# Patient Record
Sex: Female | Born: 1985
Health system: Southern US, Community
[De-identification: ages and names within clinical notes are randomized; demographics above are authoritative.]

## PROBLEM LIST (undated history)

## (undated) DIAGNOSIS — R112 Nausea with vomiting, unspecified: Secondary | ICD-10-CM

## (undated) DIAGNOSIS — J811 Chronic pulmonary edema: Secondary | ICD-10-CM

## (undated) DIAGNOSIS — F329 Major depressive disorder, single episode, unspecified: Secondary | ICD-10-CM

## (undated) DIAGNOSIS — I5189 Other ill-defined heart diseases: Secondary | ICD-10-CM

## (undated) DIAGNOSIS — N10 Acute tubulo-interstitial nephritis: Secondary | ICD-10-CM

## (undated) DIAGNOSIS — A4150 Gram-negative sepsis, unspecified: Secondary | ICD-10-CM

## (undated) DIAGNOSIS — T4145XA Adverse effect of unspecified anesthetic, initial encounter: Secondary | ICD-10-CM

## (undated) DIAGNOSIS — A4151 Sepsis due to Escherichia coli [E. coli]: Secondary | ICD-10-CM

## (undated) DIAGNOSIS — J9600 Acute respiratory failure, unspecified whether with hypoxia or hypercapnia: Secondary | ICD-10-CM

## (undated) DIAGNOSIS — B019 Varicella without complication: Secondary | ICD-10-CM

## (undated) DIAGNOSIS — F909 Attention-deficit hyperactivity disorder, unspecified type: Secondary | ICD-10-CM

## (undated) DIAGNOSIS — I429 Cardiomyopathy, unspecified: Secondary | ICD-10-CM

## (undated) DIAGNOSIS — F419 Anxiety disorder, unspecified: Secondary | ICD-10-CM

## (undated) DIAGNOSIS — T8859XA Other complications of anesthesia, initial encounter: Secondary | ICD-10-CM

## (undated) DIAGNOSIS — N390 Urinary tract infection, site not specified: Secondary | ICD-10-CM

## (undated) DIAGNOSIS — R6521 Severe sepsis with septic shock: Secondary | ICD-10-CM

## (undated) DIAGNOSIS — N1330 Unspecified hydronephrosis: Secondary | ICD-10-CM

## (undated) DIAGNOSIS — J189 Pneumonia, unspecified organism: Secondary | ICD-10-CM

## (undated) DIAGNOSIS — I519 Heart disease, unspecified: Secondary | ICD-10-CM

## (undated) DIAGNOSIS — G43909 Migraine, unspecified, not intractable, without status migrainosus: Secondary | ICD-10-CM

## (undated) DIAGNOSIS — Z87442 Personal history of urinary calculi: Secondary | ICD-10-CM

## (undated) DIAGNOSIS — F32A Depression, unspecified: Secondary | ICD-10-CM

## (undated) DIAGNOSIS — Z9889 Other specified postprocedural states: Secondary | ICD-10-CM

## (undated) HISTORY — DX: Urinary tract infection, site not specified: N39.0

## (undated) HISTORY — DX: Attention-deficit hyperactivity disorder, unspecified type: F90.9

## (undated) HISTORY — DX: Varicella without complication: B01.9

## (undated) HISTORY — PX: WISDOM TOOTH EXTRACTION: SHX21

## (undated) HISTORY — DX: Major depressive disorder, single episode, unspecified: F32.9

## (undated) HISTORY — DX: Migraine, unspecified, not intractable, without status migrainosus: G43.909

## (undated) HISTORY — DX: Depression, unspecified: F32.A

---

## 2017-02-13 ENCOUNTER — Ambulatory Visit (INDEPENDENT_AMBULATORY_CARE_PROVIDER_SITE_OTHER): Payer: BLUE CROSS/BLUE SHIELD | Admitting: Family

## 2017-02-13 ENCOUNTER — Encounter: Payer: Self-pay | Admitting: Family

## 2017-02-13 VITALS — BP 117/66 | HR 83 | Temp 98.0°F | Resp 16 | Ht 63.0 in | Wt 179.0 lb

## 2017-02-13 DIAGNOSIS — G43909 Migraine, unspecified, not intractable, without status migrainosus: Secondary | ICD-10-CM | POA: Diagnosis not present

## 2017-02-13 DIAGNOSIS — Z23 Encounter for immunization: Secondary | ICD-10-CM | POA: Diagnosis not present

## 2017-02-13 DIAGNOSIS — N390 Urinary tract infection, site not specified: Secondary | ICD-10-CM | POA: Diagnosis not present

## 2017-02-13 DIAGNOSIS — Z0001 Encounter for general adult medical examination with abnormal findings: Secondary | ICD-10-CM

## 2017-02-13 DIAGNOSIS — E559 Vitamin D deficiency, unspecified: Secondary | ICD-10-CM

## 2017-02-13 DIAGNOSIS — F329 Major depressive disorder, single episode, unspecified: Secondary | ICD-10-CM | POA: Diagnosis not present

## 2017-02-13 DIAGNOSIS — Z79899 Other long term (current) drug therapy: Secondary | ICD-10-CM

## 2017-02-13 DIAGNOSIS — G4700 Insomnia, unspecified: Secondary | ICD-10-CM

## 2017-02-13 DIAGNOSIS — F32A Depression, unspecified: Secondary | ICD-10-CM

## 2017-02-13 DIAGNOSIS — Z87440 Personal history of urinary (tract) infections: Secondary | ICD-10-CM

## 2017-02-13 DIAGNOSIS — F909 Attention-deficit hyperactivity disorder, unspecified type: Secondary | ICD-10-CM | POA: Diagnosis not present

## 2017-02-13 DIAGNOSIS — Z0000 Encounter for general adult medical examination without abnormal findings: Secondary | ICD-10-CM

## 2017-02-13 HISTORY — DX: Attention-deficit hyperactivity disorder, unspecified type: F90.9

## 2017-02-13 LAB — POC URINALSYSI DIPSTICK (AUTOMATED)
Bilirubin, UA: NEGATIVE
Glucose, UA: NEGATIVE
KETONES UA: NEGATIVE
Leukocytes, UA: NEGATIVE
Nitrite, UA: NEGATIVE
PH UA: 6 (ref 5.0–8.0)
PROTEIN UA: NEGATIVE
RBC UA: NEGATIVE
SPEC GRAV UA: 1.025 (ref 1.010–1.025)
UROBILINOGEN UA: 0.2 U/dL

## 2017-02-13 MED ORDER — ZOLPIDEM TARTRATE 5 MG PO TABS: 5.0000 mg | ORAL_TABLET | Freq: Every evening | ORAL | 0 refills | Status: DC | PRN

## 2017-02-13 NOTE — Patient Instructions (Addendum)
You can try scheduling an appointment with Dr. Rosanna Randy at [facility] [phone] You will be contacted about your referral to GYN- Dr. Erin Fulling. Please complete lab work prior to leaving.

## 2017-02-13 NOTE — Assessment & Plan Note (Signed)
Not on meds. She is interested in starting meds. Will [facility] to [facility]

## 2017-02-13 NOTE — Progress Notes (Signed)
Subjective:    Patient ID: Stephanie Patrick, female    DOB: 03/18/1985, 31 y.o.   MRN: [id number]  HPI  Patient is a 31 yr old female who presents today to establish care.  pmhx is significant for the following:  1) recurrent UTI- last infection was 5/18. Happens more when she is sexually active. Not currently sexually active.    2) Migraines- Had migraines in HS. No migraines since.   3) Depression-  Reports difficulty sleeping. Using melatonin. Has tried lunesta without improvement.  Ambien helps.  Trazodone made her very groggy.    4) Vit D deficiency- taking 50,000 once weekly.    5) Insomnia- reports that this is a long-standing problem for her.  She currently takes melatonin 6 mg by mouth once daily.  She reports that she has taken Ambien in the past which has helped her.  She does not wish to take this on a daily basis however.  6) ADHD- not on medication.  Notes some difficulty processing sound.  She does not feel that she has issues hearing the noises.  This seems to be worse since she has been off of ADHD medication.  She is open minded to restarting medication.  Preventative care  Immunizations: td 2012, due for flu shot Diet: workig on healthy diet, has lost 16 pounds Exercise:  Some exercise, trying to add more, quit Pap Smear: 2017, normal per patient  Review of Systems  Constitutional: Negative for unexpected weight change.  HENT: Negative for rhinorrhea.   Respiratory: Negative for cough.   Cardiovascular: Negative for leg swelling.  Gastrointestinal: Negative for abdominal pain, constipation and vomiting.  Genitourinary: Negative for dysuria, frequency and menstrual problem.  Musculoskeletal:       Some shoulder "blade" pain- sees chiropractor  Skin: Negative for rash.  Neurological: Negative for headaches.  Hematological: Negative for adenopathy.  Psychiatric/Behavioral:       Depression is stable.        Past Medical History:  Diagnosis Date  .  Chicken pox   . Depression   . Migraines   . Urinary tract infection      Social History   Socioeconomic History  . Marital status: Single    Spouse name: Not on file  . Number of children: Not on file  . Years of education: Not on file  . Highest education level: Not on file  Social Needs  . Financial resource strain: Not on file  . Food insecurity - worry: Not on file  . Food insecurity - inability: Not on file  . Transportation needs - medical: Not on file  . Transportation needs - non-medical: Not on file  Occupational History  . Not on file  Tobacco Use  . Smoking status: Never Smoker  . Smokeless tobacco: Never Used  Substance and Sexual Activity  . Alcohol use: Yes  . Drug use: No  . Sexual activity: Yes    Partners: Male  Other Topics Concern  . Not on file  Social History Narrative  . Not on file    History reviewed. No pertinent surgical history.  Family History  Problem Relation Age of Onset  . Cancer Maternal Grandmother   . Diabetes Maternal Grandmother   . Hypertension Maternal Grandmother     Allergies not on file  Current Outpatient Medications on File Prior to Visit  Medication Sig Dispense Refill  . Ascorbic Acid (VITAMIN C) 100 MG tablet Take 100 mg by mouth daily.    .Marland Kitchen  b complex vitamins tablet Take 1 tablet by mouth daily.    . cholecalciferol (VITAMIN D) 1000 units tablet Take 1,000 Units by mouth daily.    . Prenatal Vit-Fe Fumarate-FA (PRENATAL MULTIVITAMIN) TABS tablet Take 1 tablet by mouth daily at 12 noon.     No current facility-administered medications on file prior to visit.     BP 117/66 (BP Location: Right Arm, Patient Position: Sitting, Cuff Size: Small)   Pulse 83   Temp 98 F (36.7 C) (Oral)   Resp 16   Ht 5\' 3"  (1.6 m)   Wt 179 lb (81.2 kg)   SpO2 100%   BMI 31.71 kg/m    Objective:   Physical Exam Physical Exam  Constitutional: She is oriented to person, place, and time. She appears well-developed and  well-nourished. No distress.  HENT:  Head: Normocephalic and atraumatic.  Right Ear: Tympanic membrane and ear canal normal.  Left Ear: Tympanic membrane and ear canal normal.  Mouth/Throat: Oropharynx is clear and moist.  Eyes: Pupils are equal, round, and reactive to light. No scleral icterus.  Neck: Normal range of motion. No thyromegaly present.  Cardiovascular: Normal rate and regular rhythm.   No murmur heard. Pulmonary/Chest: Effort normal and breath sounds normal. No respiratory distress. He has no wheezes. She has no rales. She exhibits no tenderness.  Abdominal: Soft. Bowel sounds are normal. She exhibits no distension and no mass. There is no tenderness. There is no rebound and no guarding.  Musculoskeletal: She exhibits no edema.  Lymphadenopathy:    She has no cervical adenopathy.  Neurological: She is alert and oriented to person, place, and time. She has normal patellar reflexes. She exhibits normal muscle tone. Coordination normal.  Skin: Skin is warm and dry.  Psychiatric: She has a normal mood and affect. Her behavior is normal. Judgment and thought content normal.  Breast/pelvic: deferred           Assessment & Plan:          Assessment & Plan:

## 2017-02-13 NOTE — Assessment & Plan Note (Signed)
Stable off meds.  Monitor.  

## 2017-02-13 NOTE — Assessment & Plan Note (Signed)
Stable currently, UA negative. We discussed possibility of post-coital abx prophylaxis in the future.

## 2017-02-14 LAB — PAIN MGMT, PROFILE 8 W/CONF, U
6 ACETYLMORPHINE: NEGATIVE ng/mL (ref <10)
ALCOHOL METABOLITES: NEGATIVE ng/mL (ref <500)
AMPHETAMINES: NEGATIVE ng/mL (ref <500)
Benzodiazepines: NEGATIVE ng/mL (ref <100)
Buprenorphine, Urine: NEGATIVE ng/mL (ref <5)
COCAINE METABOLITE: NEGATIVE ng/mL (ref <150)
CREATININE: 73.1 mg/dL
MARIJUANA METABOLITE: NEGATIVE ng/mL (ref <20)
MDMA: NEGATIVE ng/mL (ref <500)
OPIATES: NEGATIVE ng/mL (ref <100)
Oxidant: NEGATIVE ug/mL (ref <200)
Oxycodone: NEGATIVE ng/mL (ref <100)
PH: 6.53 (ref 4.5–9.0)

## 2017-02-14 LAB — CBC WITH DIFFERENTIAL/PLATELET
BASOS ABS: 51 {cells}/uL (ref 0–200)
Basophils Relative: 0.7 %
EOS PCT: 1.4 %
Eosinophils Absolute: 102 cells/uL (ref 15–500)
HCT: 40.3 % (ref 35.0–45.0)
Hemoglobin: 14 g/dL (ref 11.7–15.5)
LYMPHS ABS: 2015 {cells}/uL (ref 850–3900)
MCH: 29.9 pg (ref 27.0–33.0)
MCHC: 34.7 g/dL (ref 32.0–36.0)
MCV: 86.1 fL (ref 80.0–100.0)
MONOS PCT: 8.1 %
MPV: 11.9 fL (ref 7.5–12.5)
NEUTROS PCT: 62.2 %
Neutro Abs: 4541 cells/uL (ref 1500–7800)
PLATELETS: 331 10*3/uL (ref 140–400)
RBC: 4.68 10*6/uL (ref 3.80–5.10)
RDW: 12.7 % (ref 11.0–15.0)
TOTAL LYMPHOCYTE: 27.6 %
WBC mixed population: 591 cells/uL (ref 200–950)
WBC: 7.3 10*3/uL (ref 3.8–10.8)

## 2017-02-14 LAB — HEPATIC FUNCTION PANEL
AG RATIO: 2 (calc) (ref 1.0–2.5)
ALKALINE PHOSPHATASE (APISO): 74 U/L (ref 33–115)
ALT: 28 U/L (ref 6–29)
AST: 26 U/L (ref 10–30)
Albumin: 4.5 g/dL (ref 3.6–5.1)
BILIRUBIN DIRECT: 0.1 mg/dL (ref 0.0–0.2)
BILIRUBIN INDIRECT: 0.3 mg/dL (ref 0.2–1.2)
BILIRUBIN TOTAL: 0.4 mg/dL (ref 0.2–1.2)
Globulin: 2.3 g/dL (calc) (ref 1.9–3.7)
Total Protein: 6.8 g/dL (ref 6.1–8.1)

## 2017-02-14 LAB — VITAMIN D 25 HYDROXY (VIT D DEFICIENCY, FRACTURES): VIT D 25 HYDROXY: 41 ng/mL (ref 30–100)

## 2017-02-14 LAB — BASIC METABOLIC PANEL
BUN: 13 mg/dL (ref 7–25)
CHLORIDE: 101 mmol/L (ref 98–110)
CO2: 24 mmol/L (ref 20–32)
CREATININE: 0.76 mg/dL (ref 0.50–1.10)
Calcium: 9.6 mg/dL (ref 8.6–10.2)
Glucose, Bld: 97 mg/dL (ref 65–99)
POTASSIUM: 4.3 mmol/L (ref 3.5–5.3)
SODIUM: 138 mmol/L (ref 135–146)

## 2017-02-14 LAB — TSH: TSH: 2.69 m[IU]/L

## 2017-02-14 LAB — LIPID PANEL
Cholesterol: 142 mg/dL (ref <200)
HDL: 34 mg/dL — ABNORMAL LOW (ref >50)
LDL Cholesterol (Calc): 90 mg/dL (calc)
NON-HDL CHOLESTEROL (CALC): 108 mg/dL (ref <130)
Total CHOL/HDL Ratio: 4.2 (calc) (ref <5.0)
Triglycerides: 89 mg/dL (ref <150)

## 2017-02-16 DIAGNOSIS — G4700 Insomnia, unspecified: Secondary | ICD-10-CM | POA: Insufficient documentation

## 2017-02-16 DIAGNOSIS — E559 Vitamin D deficiency, unspecified: Secondary | ICD-10-CM | POA: Insufficient documentation

## 2017-02-16 DIAGNOSIS — G43909 Migraine, unspecified, not intractable, without status migrainosus: Secondary | ICD-10-CM | POA: Insufficient documentation

## 2017-02-16 DIAGNOSIS — Z0000 Encounter for general adult medical examination without abnormal findings: Secondary | ICD-10-CM | POA: Insufficient documentation

## 2017-02-16 NOTE — Assessment & Plan Note (Signed)
Will prescribe as needed Ambien.  Urine drug screen will be obtained today and a controlled substance contract is signed.

## 2017-02-16 NOTE — Assessment & Plan Note (Signed)
Stable, check vit D level.  Continue supplement.

## 2017-02-16 NOTE — Assessment & Plan Note (Signed)
Stable, no recent migraines.  

## 2017-02-16 NOTE — Assessment & Plan Note (Signed)
Discussed healthy diet, regular exercise and weight loss.  Refer to GYN for ongoing gyn care at pt request- pap is up to date. She wishes to have IUD removed.  Flu shot today. Obtain routine lab work.

## 2017-03-01 ENCOUNTER — Encounter: Payer: Self-pay | Admitting: Family

## 2017-03-02 ENCOUNTER — Other Ambulatory Visit: Payer: Self-pay | Admitting: Family

## 2017-03-02 MED ORDER — ZOLPIDEM TARTRATE 10 MG PO TABS: 10.0000 mg | ORAL_TABLET | Freq: Every evening | ORAL | 2 refills | Status: DC | PRN

## 2017-03-06 ENCOUNTER — Ambulatory Visit: Payer: Self-pay | Admitting: Family

## 2017-03-11 ENCOUNTER — Encounter: Payer: BLUE CROSS/BLUE SHIELD | Admitting: Obstetrics & Gynecology

## 2017-03-19 ENCOUNTER — Encounter: Payer: BLUE CROSS/BLUE SHIELD | Admitting: Obstetrics & Gynecology

## 2017-03-23 ENCOUNTER — Encounter: Payer: BLUE CROSS/BLUE SHIELD | Admitting: Obstetrics & Gynecology

## 2017-03-25 ENCOUNTER — Encounter: Payer: BLUE CROSS/BLUE SHIELD | Admitting: Obstetrics & Gynecology

## 2017-03-26 ENCOUNTER — Ambulatory Visit: Payer: Self-pay | Admitting: Family

## 2017-03-27 ENCOUNTER — Encounter: Payer: Self-pay | Admitting: Family

## 2017-03-27 ENCOUNTER — Ambulatory Visit: Payer: BLUE CROSS/BLUE SHIELD | Admitting: Family

## 2017-03-27 VITALS — BP 114/73 | HR 96 | Temp 98.4°F | Resp 16 | Ht 63.0 in

## 2017-03-27 DIAGNOSIS — G4700 Insomnia, unspecified: Secondary | ICD-10-CM

## 2017-03-27 DIAGNOSIS — K1379 Other lesions of oral mucosa: Secondary | ICD-10-CM | POA: Diagnosis not present

## 2017-03-27 DIAGNOSIS — F988 Other specified behavioral and emotional disorders with onset usually occurring in childhood and adolescence: Secondary | ICD-10-CM

## 2017-03-27 DIAGNOSIS — R350 Frequency of micturition: Secondary | ICD-10-CM | POA: Diagnosis not present

## 2017-03-27 DIAGNOSIS — F419 Anxiety disorder, unspecified: Secondary | ICD-10-CM | POA: Diagnosis not present

## 2017-03-27 MED ORDER — ZOLPIDEM TARTRATE 10 MG PO TABS: 10.0000 mg | ORAL_TABLET | Freq: Every evening | ORAL | 2 refills | Status: DC | PRN

## 2017-03-27 MED ORDER — LIDOCAINE VISCOUS 2 % MT SOLN: OROMUCOSAL | 5 refills | Status: DC

## 2017-03-27 NOTE — Patient Instructions (Signed)
Please complete lab work prior to leaving.   

## 2017-03-27 NOTE — Progress Notes (Signed)
Subjective:    Patient ID: Stephanie Patrick, female    DOB: 10/17/1985, 32 y.o.   MRN: [id number]  HPI  Stephanie Patrick is a 32 yr old female who presents today with report of recurrent UTI.  Was given Cipro/pyridium via E-visit for UTI. Also recently treated for "strep" via e-visit.  She reports that she has some urinary frequency.  Will urinate every hour while awake.  Though she admits to drinking 100 ounces of water a day.  After she urinates, about 20 minutes later she feels as if she needs to urinate again.  She reports that she has had 4 urinary tract infections in the last year.  Typically her UTIs occur after intercourse (she is not currently sexually active), or after she has been in a pool.  She did not note any precipitating factors for her most recent UTI.  [facility]  was told  that she is not depressed but does show features of attention deficit disorder and anxiety.     Insomnia-Taking ambien 10mg  nightly.  This is helping her.  She notes that she recently began Invisilign braces.  She will be doing a one-year program which requires her to get a new tray every week.  She is having a lot of discomfort from the invisilign.  A lot of her pain is on the oral mucosa.As a result she has been taking 600 mg of ibuprofen 3 times daily.  She has also been using Tylenol for ibuprofen PM.    Review of Systems See hpi  Past Medical History:  Diagnosis Date  . ADHD 02/13/2017  . Chicken pox   . Depression   . Migraines   . Urinary tract infection      Social History   Socioeconomic History  . Marital status: Single    Spouse name: Not on file  . Number of children: Not on file  . Years of education: Not on file  . Highest education level: Not on file  Social Needs  . Financial resource strain: Not on file  . Food insecurity - worry: Not on file  . Food insecurity - inability: Not on file  .  Transportation needs - medical: Not on file  . Transportation needs - non-medical: Not on file  Occupational History  . Not on file  Tobacco Use  . Smoking status: Never Smoker  . Smokeless tobacco: Never Used  Substance and Sexual Activity  . Alcohol use: Yes  . Drug use: No  . Sexual activity: Yes    Partners: Male  Other Topics Concern  . Not on file  Social History Narrative   Dad committed suicide   Mom in [place]   Book clubs online   Best friend lives here    No past surgical history on file.  Family History  Problem Relation Age of Onset  . Cancer Maternal Grandmother   . Diabetes Maternal Grandmother   . Hypertension Maternal Grandmother     Not on File  Current Outpatient Medications on File Prior to Visit  Medication Sig Dispense Refill  . Ascorbic Acid (VITAMIN C) 100 MG tablet Take 100 mg by mouth daily.    Marland Kitchen b complex vitamins tablet Take 1 tablet by mouth daily.    . cholecalciferol (VITAMIN D) 1000 units tablet Take 1,000 Units by mouth daily.    . Prenatal Vit-Fe Fumarate-FA (PRENATAL MULTIVITAMIN) TABS tablet Take 1 tablet by mouth daily at 12  noon.     No current facility-administered medications on file prior to visit.     BP 114/73 (BP Location: Right Arm, Patient Position: Sitting, Cuff Size: Small)   Pulse 96   Temp 98.4 F (36.9 C) (Oral)   Resp 16   Ht 5\' 3"  (1.6 m)   SpO2 100%   BMI 31.71 kg/m       Objective:   Physical Exam  Constitutional: She appears well-developed and well-nourished.  HENT:  Some oral mucosal irritation noted particularly inside the lower anterior lip  Cardiovascular: Normal rate, regular rhythm and normal heart sounds.  No murmur heard. Pulmonary/Chest: Effort normal and breath sounds normal. No respiratory distress. She has no wheezes.  Psychiatric: She has a normal mood and affect. Her behavior is normal. Judgment and thought content normal.          Assessment & Plan:  Mouth pain-I will give  her a prescription for viscous lidocaine swish and spit as needed for pain.  I have advised her to limit her use of ibuprofen as its rough on the stomach and kidneys.  Instead I have advised her to use Tylenol first line thousand milligrams 3 times daily as needed.  Do not exceed 3000 mg/day.  Advised her to reserve use of ibuprofen only for occasional severe pain.  Attention deficit-advised her to keep her upcoming appointment with [facility].  Anxiety-she is working with a counselor at the treatment center.  She hopes to avoid medication for this.  Insomnia-stable with use of Ambien 10 mg nightly.  A prescription was sent to her pharmacy.Review of the [place] controlled substance registry is performed and no discrepancies are noted.  Urinary frequency-we will send for urinalysis and culture.  We discussed if symptoms become more frequent we could consider prophylactic antibiotics and/or referral to urology.  She wishes to wait on both for now.

## 2017-03-28 LAB — URINALYSIS, ROUTINE W REFLEX MICROSCOPIC
Bacteria, UA: NONE SEEN /HPF
Bilirubin Urine: NEGATIVE
GLUCOSE, UA: NEGATIVE
Hyaline Cast: NONE SEEN /LPF
KETONES UR: NEGATIVE
LEUKOCYTES UA: NEGATIVE
Nitrite: NEGATIVE
PH: 6.5 (ref 5.0–8.0)
Protein, ur: NEGATIVE
SPECIFIC GRAVITY, URINE: 1.006 (ref 1.001–1.03)
Squamous Epithelial / LPF: NONE SEEN /HPF (ref <=5)
WBC UA: NONE SEEN /HPF (ref 0–5)

## 2017-03-29 ENCOUNTER — Encounter: Payer: Self-pay | Admitting: Family

## 2017-03-29 LAB — URINE CULTURE
MICRO NUMBER: 90109015
SPECIMEN QUALITY:: ADEQUATE

## 2017-04-06 ENCOUNTER — Encounter: Payer: Self-pay | Admitting: Obstetrics & Gynecology

## 2017-04-06 ENCOUNTER — Ambulatory Visit (INDEPENDENT_AMBULATORY_CARE_PROVIDER_SITE_OTHER): Payer: BLUE CROSS/BLUE SHIELD | Admitting: Obstetrics & Gynecology

## 2017-04-06 VITALS — BP 116/60 | HR 83 | Resp 22 | Ht 63.0 in | Wt 175.0 lb

## 2017-04-06 DIAGNOSIS — Z01419 Encounter for gynecological examination (general) (routine) without abnormal findings: Secondary | ICD-10-CM | POA: Diagnosis not present

## 2017-04-06 DIAGNOSIS — Z124 Encounter for screening for malignant neoplasm of cervix: Secondary | ICD-10-CM

## 2017-04-06 DIAGNOSIS — Z1151 Encounter for screening for human papillomavirus (HPV): Secondary | ICD-10-CM | POA: Diagnosis not present

## 2017-04-06 DIAGNOSIS — Z113 Encounter for screening for infections with a predominantly sexual mode of transmission: Secondary | ICD-10-CM | POA: Diagnosis not present

## 2017-04-06 NOTE — Progress Notes (Signed)
Subjective:     Stephanie Patrick is a 32 y.o. female here for a routine exam.  G1P0010 LMP no menses on LnIUD on  Current complaints: no problems.  Pt had IUD placed in 11/2012. Pt reports weight gain of 70#s in 1 year. 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 She is not currently sexually active.   She prev has a serum screen + for HSV but, has never had a lesion.   Grandmother had breast cancer at age 81year.    Gynecologic History No LMP recorded. Patient is not currently having periods (Reason: IUD). Contraception: IUD Last Pap: Dec 2017. Results were: normal. No h/o abnormal PAPs Last mammogram: n/a.   Obstetric History OB History  Gravida Para Term Preterm AB Living  1       1    SAB TAB Ectopic Multiple Live Births  1            # Outcome Date GA Lbr Len/2nd Weight Sex Delivery Anes PTL Lv  1 SAB              The following portions of the patient's history were reviewed and updated as appropriate: allergies, current medications, past family history, past medical history, past social history, past surgical history and problem list.  Review of Systems Pertinent items are noted in HPI.    Objective:  BP 116/60 (BP Location: Left Arm, Patient Position: Sitting)   Pulse 83   Resp (!) 22   Ht 5\' 3"  (1.6 m)   Wt 175 lb (79.4 kg)   BMI 31.00 kg/m  General Appearance:    Alert, cooperative, no distress, appears stated age  Head:    Normocephalic, without obvious abnormality, atraumatic  Eyes:    conjunctiva/corneas clear, EOM's intact, both eyes  Ears:    Normal external ear canals, both ears  Nose:   Nares normal, septum midline, mucosa normal, no drainage    or sinus tenderness  Throat:   Lips, mucosa, and tongue normal; teeth and gums normal  Neck:   Supple, symmetrical, trachea midline, no adenopathy;    thyroid:  no enlargement/tenderness/nodules  Back:     Symmetric, no curvature, ROM normal, no CVA tenderness  Lungs:     Clear to auscultation  bilaterally, respirations unlabored  Chest Wall:    No tenderness or deformity   Heart:    Regular rate and rhythm, S1 and S2 normal, no murmur, rub   or gallop  Breast Exam:    No tenderness, masses, or nipple abnormality  Abdomen:     Soft, non-tender, bowel sounds active all four quadrants,    no masses, no organomegaly  Genitalia:    Normal female without lesion, discharge or tenderness     Extremities:   Extremities normal, atraumatic, no cyanosis or edema  Pulses:   2+ and symmetric all extremities  Skin:   Skin color, texture, turgor normal, no rashes or lesions     Assessment:    Healthy female exam.   Contraception counseling STI screen   Plan:    Follow up in: 1 year.  for annual F.u PAP with STI screen Pt declines serum STI screen Pt will f/u in 11/2017 to discussed plan for LnIUD. She is trying to decide between removing it and taking a break vs. Having it replaced.   Zelig Gacek L. Harraway-Smith, M.D., [facility]

## 2017-04-06 NOTE — Progress Notes (Signed)
[name] states she has had IUD for four years. Armandina Stammer RNBSN

## 2017-04-08 LAB — CYTOLOGY - PAP
CHLAMYDIA, DNA PROBE: NEGATIVE
HPV: DETECTED — AB
NEISSERIA GONORRHEA: NEGATIVE

## 2017-04-09 ENCOUNTER — Encounter: Payer: Self-pay | Admitting: Obstetrics & Gynecology

## 2017-04-10 ENCOUNTER — Telehealth: Payer: Self-pay

## 2017-04-10 NOTE — Telephone Encounter (Signed)
Patient called and cant do the Feb 18th appointment. Explained to the patient the colposcopy procedure and answered questions. Patient anxious about results (HPV+) since she had the HPV vaccinations. Explained that the HPV vaccines doesn't cover all strains of HPV. Patient requested medication to take to get rid of the virus and made aware that there is no medication to take for this.  Rescheduled her for March 13th but then she states she can do that date. Rescheduled her for March 15th with Dr. Erin Fulling. Armandina Stammer RNBSN

## 2017-04-20 ENCOUNTER — Encounter: Payer: BLUE CROSS/BLUE SHIELD | Admitting: Obstetrics & Gynecology

## 2017-05-12 ENCOUNTER — Ambulatory Visit: Payer: BLUE CROSS/BLUE SHIELD | Admitting: Family

## 2017-05-12 ENCOUNTER — Encounter: Payer: Self-pay | Admitting: Family

## 2017-05-12 VITALS — BP 116/72 | HR 100 | Temp 97.8°F | Resp 16 | Ht 63.0 in | Wt 175.0 lb

## 2017-05-12 DIAGNOSIS — N930 Postcoital and contact bleeding: Secondary | ICD-10-CM | POA: Diagnosis not present

## 2017-05-12 NOTE — Progress Notes (Signed)
   Subjective:    Patient ID: Stephanie Patrick, female    DOB: [birth year], 32 y.o.   MRN: [id number]  HPI  Patient is a 32 year old female who presents today with chief complaint of postcoital bleeding.  She reports that she had been abstinent for 6 months.  She has a new sexual partner and has been using condoms consistently.  She reports that she noted bleeding just immediately following sex.  She had no pain during intercourse or afterwards.  She had a recent abnormal Pap and is scheduled for upcoming colposcopy.  She reports that she had full STD screening.  She denies vaginal discharge.  Review of Systems    see HPI Objective:   Physical Exam  Constitutional: She is oriented to person, place, and time. She appears well-developed and well-nourished.  Cardiovascular: Normal rate and regular rhythm.  No murmur heard. Pulmonary/Chest: Effort normal and breath sounds normal. No respiratory distress. She has no wheezes. She has no rales. She exhibits no tenderness.  Genitourinary:  Genitourinary Comments: Speculum exam reveals normal-appearing cervix.  There is some irritation on the lining of the vaginal wall.  Vulvar are normal.  There are some small folliculitis spots noted overlying the mons from shaving. Normal bimanual exam.  Musculoskeletal: She exhibits no edema.  Neurological: She is alert and oriented to person, place, and time.  Psychiatric: She has a normal mood and affect. Her behavior is normal. Thought content normal.          Assessment & Plan:  Post Coital Bleeding-I suspect that this is just due to trauma from recent sexual activity.  I see no concerning lesions.  I have advised her to continue regular condom use.  Also to use a good lubricant during intercourse such as [place].  She is advised to abstain from intercourse for the next week or so to allow the area to heal.  I have also advised her to keep her upcoming appointment with gynecology.   A total of 15 minutes  were spent face-to-face with the patient during this encounter and over half of that time was spent on counseling and coordination of care. The patient was counseled on safe sex, use of  Vaginal lubricant etc.

## 2017-05-12 NOTE — Patient Instructions (Signed)
Use a good vaginal lubricant with intercourse such as KY jelly. Keep your upcoming appointment with GYN.

## 2017-05-13 ENCOUNTER — Encounter: Payer: BLUE CROSS/BLUE SHIELD | Admitting: Obstetrics & Gynecology

## 2017-05-13 ENCOUNTER — Ambulatory Visit: Payer: Self-pay | Admitting: Family

## 2017-05-15 ENCOUNTER — Encounter: Payer: BLUE CROSS/BLUE SHIELD | Admitting: Obstetrics & Gynecology

## 2017-06-04 ENCOUNTER — Ambulatory Visit (INDEPENDENT_AMBULATORY_CARE_PROVIDER_SITE_OTHER): Payer: BLUE CROSS/BLUE SHIELD | Admitting: Family Medicine

## 2017-06-04 ENCOUNTER — Encounter: Payer: Self-pay | Admitting: Family Medicine

## 2017-06-04 VITALS — BP 109/66 | HR 100 | Ht 62.5 in

## 2017-06-04 DIAGNOSIS — Z113 Encounter for screening for infections with a predominantly sexual mode of transmission: Secondary | ICD-10-CM | POA: Diagnosis not present

## 2017-06-04 NOTE — Progress Notes (Signed)
Patient requests STD screening.

## 2017-06-04 NOTE — Progress Notes (Signed)
   Subjective:    Patient ID: Stephanie Patrick, female    DOB: 1985/05/22, 32 y.o.   MRN: [id number]  HPI Patient here for STD screening. Her partner is traveling and had an "outbreak" on his penis. Patient is not sure what it was. Has history of HPV and maybe HSV. Uses condoms with every sexual encounter. Has IUD.   Review of Systems     Objective:   Physical Exam  Constitutional: She appears well-developed and well-nourished.  HENT:  Head: Normocephalic.  Abdominal: Soft. Hernia confirmed negative in the right inguinal area and confirmed negative in the left inguinal area.  Genitourinary: There is no rash, tenderness or lesion on the right labia. There is no rash, tenderness or lesion on the left labia. No erythema, tenderness or bleeding in the vagina. No foreign body in the vagina. No signs of injury around the vagina. No vaginal discharge found.    Lymphadenopathy:       Right: No inguinal adenopathy present.       Left: No inguinal adenopathy present.  Skin: Skin is warm and dry.  Psychiatric: She has a normal mood and affect. Her behavior is normal. Thought content normal.      Assessment & Plan:  1. Screening for STD (sexually transmitted disease) Discussed limitations on HSV - patient would still like to get it. Recommended condom use. Has colposcopy next week.  - RPR - Hepatitis C Antibody - Hepatitis B Surface AntiGEN - HIV antibody (with reflex) - Cervicovaginal ancillary only - HSV(herpes smplx)abs-1+2(IgG+IgM)-bld

## 2017-06-05 ENCOUNTER — Encounter (INDEPENDENT_AMBULATORY_CARE_PROVIDER_SITE_OTHER): Payer: Self-pay

## 2017-06-05 LAB — CERVICOVAGINAL ANCILLARY ONLY
Bacterial vaginitis: NEGATIVE
Candida vaginitis: NEGATIVE
Chlamydia: NEGATIVE
Neisseria Gonorrhea: NEGATIVE
Trichomonas: NEGATIVE

## 2017-06-05 LAB — HEPATITIS B SURFACE ANTIGEN: Hepatitis B Surface Ag: NEGATIVE

## 2017-06-05 LAB — RPR: RPR: NONREACTIVE

## 2017-06-05 LAB — HEPATITIS C ANTIBODY

## 2017-06-05 LAB — HIV ANTIBODY (ROUTINE TESTING W REFLEX): HIV Screen 4th Generation wRfx: NONREACTIVE

## 2017-06-06 LAB — HSV(HERPES SMPLX)ABS-I+II(IGG+IGM)-BLD
HSV 1 Glycoprotein G Ab, IgG: 49.7 index — ABNORMAL HIGH (ref 0.00–0.90)
HSV 2 IgG, Type Spec: 13.3 index — ABNORMAL HIGH (ref 0.00–0.90)
HSVI/II Comb IgM: <0.91 Ratio (ref 0.00–0.90)

## 2017-06-08 ENCOUNTER — Encounter (INDEPENDENT_AMBULATORY_CARE_PROVIDER_SITE_OTHER): Payer: Self-pay

## 2017-06-12 ENCOUNTER — Ambulatory Visit (INDEPENDENT_AMBULATORY_CARE_PROVIDER_SITE_OTHER): Payer: BLUE CROSS/BLUE SHIELD | Admitting: Obstetrics & Gynecology

## 2017-06-12 ENCOUNTER — Encounter: Payer: Self-pay | Admitting: Obstetrics & Gynecology

## 2017-06-12 ENCOUNTER — Other Ambulatory Visit (HOSPITAL_COMMUNITY)
Admission: RE | Admit: 2017-06-12 | Discharge: 2017-06-12 | Disposition: A | Discharge status: Home / Self Care | Payer: BLUE CROSS/BLUE SHIELD | Source: Ambulatory Visit | Attending: Obstetrics & Gynecology | Admitting: Obstetrics & Gynecology

## 2017-06-12 VITALS — BP 117/76 | HR 100

## 2017-06-12 DIAGNOSIS — R399 Unspecified symptoms and signs involving the genitourinary system: Secondary | ICD-10-CM

## 2017-06-12 DIAGNOSIS — Z3202 Encounter for pregnancy test, result negative: Secondary | ICD-10-CM | POA: Diagnosis not present

## 2017-06-12 DIAGNOSIS — Z01818 Encounter for other preprocedural examination: Secondary | ICD-10-CM

## 2017-06-12 DIAGNOSIS — R87612 Low grade squamous intraepithelial lesion on cytologic smear of cervix (LGSIL): Secondary | ICD-10-CM | POA: Insufficient documentation

## 2017-06-12 LAB — POCT URINALYSIS DIPSTICK
Bilirubin, UA: NEGATIVE
GLUCOSE UA: NEGATIVE
Ketones, UA: NEGATIVE
LEUKOCYTES UA: NEGATIVE
Nitrite, UA: NEGATIVE
Protein, UA: NEGATIVE
Spec Grav, UA: 1.015 (ref 1.010–1.025)
Urobilinogen, UA: 0.2 E.U./dL
pH, UA: 6.5 (ref 5.0–8.0)

## 2017-06-12 LAB — POCT URINE PREGNANCY: Preg Test, Ur: NEGATIVE

## 2017-06-12 NOTE — Patient Instructions (Signed)
Colposcopy Colposcopy is a procedure to examine the lowest part of the uterus (cervix), the vagina, and the area around the vaginal opening (vulva) for abnormalities or signs of disease. The procedure is done using a lighted microscope or magnifying lens (colposcope). If any unusual cells are found during the procedure, your health care provider may remove a tissue sample for testing (biopsy). A colposcopy may be done if you:  Have an abnormal Pap test. A Pap test is a screening test that is used to check for signs of cancer or infection of the vagina, cervix, and uterus.  Have a Pap smear test in which you test positive for high-risk HPV (human papillomavirus).  Have a sore or lesion on your cervix.  Have genital warts on your vulva, vagina, or cervix.  Took certain medicines while pregnant, such as diethylstilbestrol (DES).  Have pain during sexual intercourse.  Have vaginal bleeding, especially after sexual intercourse.  Need to have a cervical polyp removed.  Need to have a lost intrauterine device (IUD) string located.  Let your health care provider know about:  Any allergies you have, including allergies to prescribed medicine, latex, or iodine.  All medicines you are taking, including vitamins, herbs, eye drops, creams, and over-the-counter medicines. Bring a list of all of your medicines to your appointment.  Any problems you or family members have had with anesthetic medicines.  Any blood disorders you have.  Any surgeries you have had.  Any medical conditions you have, such as pelvic inflammatory disease (PID) or endometrial disorder.  Any history of frequent fainting.  Your menstrual cycle and what form of birth control (contraception) you use.  Your medical history, including any prior cervical treatment.  Whether you are pregnant or may be pregnant. What are the risks? Generally, this is a safe procedure. However, problems may occur,  including:  Pain.  Infection, which may include a fever, bad-smelling discharge, or pelvic pain.  Bleeding or discharge.  Misdiagnosis.  Fainting and vasovagal reactions, but this is rare.  Allergic reactions to medicines.  Damage to other structures or organs.  What happens before the procedure?  If you have your menstrual period or will have it at the time of your procedure, tell your health care provider. A colposcopy typically is not done during menstruation.  Continue your contraceptive practices before and after the procedure.  For 24 hours before the colposcopy: ? Do not douche. ? Do not use tampons. ? Do not use medicines, creams, or suppositories in the vagina. ? Do not have sexual intercourse.  Ask your health care provider about: ? Changing or stopping your regular medicines. This is especially important if you are taking diabetes medicines or blood thinners. ? Taking medicines such as aspirin and ibuprofen. These medicines can thin your blood. Do not take these medicines before your procedure if your health care provider instructs you not to. It is likely that your health care provider will tell you to avoid taking aspirin or medicine that contains aspirin for 7 days before the procedure.  Follow instructions from your health care provider about eating or drinking restrictions. You will likely need to eat a regular diet the day of the procedure and not skip any meals.  You may have an exam or testing. A pregnancy test will be taken on the day of the procedure.  You may have a blood or urine sample taken.  Plan to have someone take you home from the hospital or clinic.  If you will   be going home right after the procedure, plan to have someone with you for 24 hours. What happens during the procedure?  You will lie down on your back, with your feet in foot rests (stirrups).  A warmed and lubricated instrument (speculum) will be inserted into your vagina. The  speculum will be used to hold apart the walls of your vagina so your health care provider can see your cervix and the inside of your vagina.  A cotton swab will be used to place a small amount of liquid solution on the areas to be examined. This solution makes it easier to see abnormal cells. You may feel a slight burning during this part.  The colposcope will be used to scan the cervix with a bright white light. The colposcope will be held near your vulvaand will magnify your vulva, vagina, and cervix for easier examination.  Your health care provider may decide to take a biopsy. If so: ? You may be given medicine to numb the area (local anesthetic). ? Surgical instruments will be used to suck out mucus and cells through your vagina. ? You may feel mild pain while the tissue sample is removed. ? Bleeding may occur. A solution may be used to stop the bleeding. ? If a sample of tissue is needed from the inside of the cervix, a different procedure called endocervical curettage (ECC) may be completed. During this procedure, a curved instrument (curette) will be used to scrape cells from your cervix or the top of your cervix (endocervix).  Your health care provider will record the location of any abnormalities. The procedure may vary among health care providers and hospitals. What happens after the procedure?  You will lie down and rest for a few minutes. You may be offered juice or cookies.  Your blood pressure, heart rate, breathing rate, and blood oxygen level will be monitored until any medicines you were given have worn off.  You may have to wear compression stockings. These stockings help to prevent blood clots and reduce swelling in your legs.  You may have some cramping in your abdomen. This should go away after a few minutes. This information is not intended to replace advice given to you by your health care provider. Make sure you discuss any questions you have with your health care  provider. Document Released: 05/10/2002 Document Revised: 10/16/2015 Document Reviewed: 09/24/2015 Elsevier Interactive Patient Education  2018 [employer] Colposcopy, Care After This sheet gives you information about how to care for yourself after your procedure. Your health care provider may also give you more specific instructions. If you have problems or questions, contact your health care provider. What can I expect after the procedure? If you had a colposcopy without a biopsy, you can expect to feel fine right away, but you may have some spotting for a few days. You can go back to your normal activities. If you had a colposcopy with a biopsy, it is common to have:  Soreness and pain. This may last for a few days.  Light-headedness.  Mild vaginal bleeding or dark-colored, grainy discharge. This may last for a few days. The discharge may be due to a solution that was used during the procedure. You may need to wear a sanitary pad during this time.  Spotting for at least 48 hours after the procedure.  Follow these instructions at home:  Take over-the-counter and prescription medicines only as told by your health care provider. Talk with your health care provider about what   type of over-the-counter pain medicine and prescription medicine you can start taking again. It is especially important to talk with your health care provider if you take blood-thinning medicine.  Do not drive or use heavy machinery while taking prescription pain medicine.  For at least 3 days after your procedure, or as long as told by your health care provider, avoid: ? Douching. ? Using tampons. ? Having sexual intercourse.  Continue to use birth control (contraception).  Limit your physical activity for the first day after the procedure as told by your health care provider. Ask your health care provider what activities are safe for you.  It is up to you to get the results of your procedure. Ask your health  care provider, or the department performing the procedure, when your results will be ready.  Keep all follow-up visits as told by your health care provider. This is important. Contact a health care provider if:  You develop a skin rash. Get help right away if:  You are bleeding heavily from your vagina or you are passing blood clots. This includes using more than one sanitary pad per hour for 2 hours in a row.  You have a fever or chills.  You have pelvic pain.  You have abnormal, yellow-colored, or bad-smelling vaginal discharge. This could be a sign of infection.  You have severe pain or cramps in your lower abdomen that do not get better with medicine.  You feel light-headed or dizzy, or you faint. Summary  If you had a colposcopy without a biopsy, you can expect to feel fine right away, but you may have some spotting for a few days. You can go back to your normal activities.  If you had a colposcopy with a biopsy, you may notice mild pain and spotting for 48 hours after the procedure.  Avoid douching, using tampons, and having sexual intercourse for 3 days after the procedure or as long as told by your health care provider.  Contact your health care provider if you have bleeding, severe pain, or signs of infection. This information is not intended to replace advice given to you by your health care provider. Make sure you discuss any questions you have with your health care provider. Document Released: 12/08/2012 Document Revised: 10/05/2015 Document Reviewed: 10/05/2015 Elsevier Interactive Patient Education  2018 [employer]  

## 2017-06-12 NOTE — Progress Notes (Signed)
Pt with abnormal PAP here for colpo. Risk factors: multiple sex partners. Inconsistent condom use. Patient given informed consent, signed copy in the chart, time out was performed.  Placed in lithotomy position. Cervix viewed with speculum and colposcope after application of acetic acid.   04/06/2017 Adequacy Satisfactory for evaluation endocervical/transformation zone component PRESENT.Abnormal    Diagnosis - LOW GRADE SQUAMOUS INTRAEPITHELIAL LESION: CIN-1/ HPV (LSIL)Abnormal    Chlamydia Negative   Comment: Normal Reference Range - Negative  Neisseria gonorrhea Negative   Comment: Normal Reference Range - Negative  HPV DETECTEDAbnormal    Comment: Normal Reference Range - NOT Detected  Material Submitted CervicoVaginal Pap [ThinPrep Imaged]Abnormal      Colposcopy adequate?  yes Acetowhite lesions? yes Punctation? no Mosaicism?  no Abnormal vasculature?  no Biopsies? yes ECC? yes   Patient was given post procedure instructions.  We will contact her regarding her biopsy results. I suspect she will need to f/u in the ofc in 1 year. If it is sooner or if a procedure is needed we will contact her.   Yaeli Hartung L. Harraway-Smith, M.D., [facility]

## 2017-06-15 LAB — URINE CULTURE

## 2017-07-20 ENCOUNTER — Encounter: Payer: Self-pay | Admitting: Obstetrics & Gynecology

## 2017-07-28 ENCOUNTER — Ambulatory Visit: Payer: BLUE CROSS/BLUE SHIELD | Attending: Internal Medicine | Admitting: Audiology

## 2017-07-28 DIAGNOSIS — H9325 Central auditory processing disorder: Secondary | ICD-10-CM | POA: Insufficient documentation

## 2017-07-28 DIAGNOSIS — H833X3 Noise effects on inner ear, bilateral: Secondary | ICD-10-CM | POA: Diagnosis present

## 2017-07-28 DIAGNOSIS — H93293 Other abnormal auditory perceptions, bilateral: Secondary | ICD-10-CM | POA: Diagnosis present

## 2017-07-28 DIAGNOSIS — H93233 Hyperacusis, bilateral: Secondary | ICD-10-CM | POA: Insufficient documentation

## 2017-07-28 DIAGNOSIS — H9313 Tinnitus, bilateral: Secondary | ICD-10-CM

## 2017-07-28 DIAGNOSIS — H93299 Other abnormal auditory perceptions, unspecified ear: Secondary | ICD-10-CM | POA: Diagnosis present

## 2017-07-28 NOTE — Procedures (Signed)
Outpatient Audiology and [facility] [address] [place], [place]  [address] [phone]  AUDIOLOGICAL AND AUDITORY PROCESSING EVALUATION  NAME: Stephanie Patrick  STATUS: Outpatient DOB:   November 25, 1985   DIAGNOSIS: Evaluate for Central auditory                                                                                    processing disorder          MRN: [id number]                                                                                      DATE: 07/28/2017   REFERENT: Dr. Marisue Brooklyn, [place] [facility]    History: Stephanie Patrick was seen for an audiological and auditory processing evaluation to include evaluation of sound sensitivity and tinnitus. Primary Concern: Thought had ADHD for years, recently told that may not have that. Had difficulty processing auditory information. She has a history of tactile sensitivity -for example, sheets have to have right amount of softener or can't sleep.  Have been using noise cancellation earphones when listening in the office- noise of computer fans drive insane and noise reduction for concerts. Stephanie Patrick notes that she has a history of and currently "does not listen carefully to directions-often necessary to repeat instructions, says "huh?" and "what?" at least five or more times per day, has a short attention span, daydreams-attention drifts-not with it at times, is easily distracted by background sound, has difficulty with phonics, has difficulty recalling sequence that has been heard and experiences difficulty following auditory directions." Pain: None History of hearing problems: Y - difficulty hearing in background noise and severe sound sensitivity. History of ear infections:  Y - a few as a child Tinnitus: Y - all of the time. 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  Hearing Thresholds are 5 dBHL on the right and 0-10 dBHL on the left. Reliability is good Speech reception levels (repeating words near threshold) using recorded spondee word lists:  Right ear: 5dBHL.  Left ear:  5dBHL Word recognition (at comfortably loud volumes) using recorded NU-6 word lists at 40 dBHL, in quiet.  Right ear: 100%.  Left ear:   100% Word recognition in minimal background noise:  +5 dBHL  Right ear: 76%                              Left ear:  64%  Tympanometry shows normal middle ear volume, pressure and compliance bilaterally (Type A). Ipsilateral acoustic reflexes were not completed because of the sound sensitivity.  Distortion Product Otoacoustic Emissions (DPOAE's), a test of inner ear function was  completed from 2000Hz  - 10,000Hz  bilaterally:  Right ear: Present responses throughout the range supporting good outer hair cell function in the cochlea.  Left ear: Present responses throughout the range supporting good outer hair cell function in the cochlea.   Tinnitus matching appeared to be approximately 750Hz  using at 10dBHL using narrow band noise.  Using speech noise tinnitus was masked at 16 dBHL.    CENTRAL AUDITORY PROCESSING EVALUATION: Uncomfortable Loudness Testing was performed using speech noise.  Stephanie Patrick reported that noise levels of 28 dBHL "bothered" (equivalent to a very soft whisper) and "hurt a lot" or "I'm freaking out it's too much" at 40dBHL (equivalent to whisper or soft speech) when presented binaurally.  By history that is supported by testing, Stephanie Patrick has severe hyperacusis or sound sensitivity.   Modified Khalfa Hyperacusis Handicap Questionnaire was completed.  Stephanie Patrick scored 64 which is SEVERE on the Loudness Sensitivity Handicap Scale. Legend reported that functionally, she "has trouble concentrating and reading in a noisy or loud environment, uses earplugs or earmuffs to reduce noise perception, finds it difficult to listen to  speaker announcements and "automatically" covers her ears in the presence of somewhat louder sounds". Sometimes she is "particularly sensitivity to or bothered by street noise".  Socially, Stephanie Patrick "finds the noise unpleasant in certain social situations" and "immediately thins about the noise she will have to put up with when someone suggests "going out to a concert". Emotionally, Stephanie Patrick reports that "noise and certain sounds cause stress and irritation, she is less able to concentrate in noise toward the end of the day, that stress and tiredness reduce her ability to concentrate in noise, that she is emotionally drained by having to put up with all daily sounds and that daily sounds have an emotional impact on her".    Speech-in-Noise testing was performed to determine speech discrimination in the presence of background noise.  Stephanie Patrick scored 76% in the right ear and 64% in the left ear, when noise was presented 5 dB below speech.  The Phonemic Synthesis test was administered to assess decoding and sound blending skills through word reception.  Stephanie Patrick's quantitative score was 17 correct (equivalent to an 32 year old) which indicates a severe decoding and sound-blending deficit, even in quiet.    Random Gap Detection test (RGDT- a revised AFT-R) was administered to measure temporal processing of minute timing differences. Stephanie Patrick scored abnormal results at 4000Hz  with 40+msec detection, but 500Hz -1000Hz  were within normal limits with 10-52msec detection.   Competing Sentences (CS) involved a different sentences being presented to each ear at different volumes. The instructions are to repeat the softer volume sentences. Posterior temporal issues will show poorer performance in the ear contralateral to the lobe involved.  Stephanie Patrick scored 30% in the right ear and 30% in the left ear.  The test results are abnormal on each side which is consistent with Central Auditory Processing Disorder (CAPD) with poor binaural  integration.  Dichotic Digits (DD) presents different two digits to each ear. All four digits are to be repeated. Poor performance suggests that cerebellar and/or brainstem may be involved. Fatemah scored 80% in the right ear (abnormal) and 90% in the left ear. The test results indicate that Taeko scored abnormal on the left side which is consistent with Central Auditory Processing Disorder (CAPD).  Musiek's Frequency (Pitch) Pattern Test requires identification of high and low pitch tones presented each ear individually. Poor performance may occur with organization, learning issues or dyslexia. Sondos scored 26% correct  in the  left and  16% correct in the right ear, which is abnormal and is consistent with Central Auditory Processing Disorder (CAPD). 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 She states that "it all sounds the same".    Summary of Cedricka's areas of difficulty: Decoding with a pitch and timing related Temporal Processing Component deals with phonemic processing.  It's an inability to sound out words or difficulty associating written letters with the sounds they represent.  Decoding problems are in difficulties with reading accuracy, oral discourse, phonics and spelling, articulation, receptive language, and understanding directions.  Oral discussions and written tests are particularly difficult. This makes it difficult to understand what is said because the sounds are not readily recognized or because people speak too rapidly.  It may be possible to follow slow, simple or repetitive material, but difficult to keep up with a fast speaker as well as new or abstract material.  Tolerance-Fading Memory (TFM) is associated with both difficulties understanding speech in the presence of background noise and poor short-term auditory memory.  Difficulties are usually seen in attention span, reading, comprehension and inferences, following  directions, poor handwriting, auditory figure-ground, short term memory, expressive and receptive language, inconsistent articulation, oral and written discourse, and problems with distractibility.  Poor Binaural Integration involves the ability to utilize two or more sensory modalities together. Typically, problems tying together auditory and visual information are seen.  Severe reading, spelling, decoding, poor handwriting and dyslexia are common.  An occupational therapy evaluation is recommended.  Reduced Word Recognition in Minimal Background Noise is the inability to hear in the presence of competing noise. This problem may be easily mistaken for inattention.  Hearing may be excellent in a quiet room but become very poor when a fan, air conditioner or heater come on, paper is rattled or music is turned on. The background noise does not have to "sound loud" to a normal listener in order for it to be a problem for someone with an auditory processing disorder.       Severe Sound Sensitivity or hyperacusis  may be identified by history and/or by testing.  Sound sensitivity may be associated with auditory processing disorder and/or sensory integration disorder (sound sensitivity or hyperacusis) so that careful testing and close monitoring is recommended.  Ashauna has a history of sound sensitivity, with no evidence of a recent change.  It is important that hearing protection be used when around noise levels that are loud and potentially damaging. If you notice the sound sensitivity becoming worse contact your physician.   CONCLUSIONS: Orli has normal hearing thresholds, middle and inner ear function bilaterally. Word recognition is excellent in quiet but drops to poor on the left and fair on the right side in minimal background noise. In addition, Amairany scored positive for having a Airline pilot Disorder (CAPD) in the areas of decoding (with poor pitch and timing temporal processing  components) and tolerance fading memory (difficulty hearing in background noise) with poor binaural integration and severe hyperacusis or sound sensitivity. Tinnitus has also been identified and measured. Please note that although Mckennah Kretchmer is bright, reads well and has graduated from college her decoding and sound blending ability is poor which will contribute to difficulty hearing in background noise. To improve decoding, consider the use of the computer based program LACE by [employer] with music lesson, in addition to a listening program (to be discussed).  Reuel Boom primary areas of difficulty are related to  both the presence and loudness of background noise when communicating with others. In most social settings, she may miss 25% of what is said, possibly more with fluctuating background noise which would benefit from minimal communication modification such as speaking face to face in person or via skype in a quiet setting. This is important for Azul Coffie because poor binaural integration makes it very difficult to ignore a competing message, which blurs the one that she is trying to listen to.   Hue also has significant difficulty with the loudness of sound -  sensitivity or severe hyperacusis.   It is important that those working with Tyler Deis be aware that she may misunderstand instructions if told in an area with poor room acoustics or a competing message, especially without a visual cue. For optimal hearing in background noise or when a competing message is present it is recommended that Keyle have conversations face to face and maintain eye contact, this may include video conferencing or Skype.   During conversations, please minimize background noise when having conversation- turn off the TV or move to a quiet area of the area. Be aware that auditory processing problems become worse with fatigue and stress so that  when tired extra vigilance may be needed to remain  involved with conversation. Avoid having important conversation when Bibiana Gillean back is to the speaker. Please also avoid "multitasking" during conversation.l.    For employers, optimal communication and conveyance of instructions will be enhanced when Anina Schnake has a list or agenda of topics to be addressed. Prior to meetings it would be helpful for Ms. Navratil to know/review what will be covered so that she may be prepared to participate. Follow-up to discussion may be enhanced with written communication to reinforce details with a written list, text or email. These minor accommodations will benefit [name] greatly.    Danessa Mensch also has difficulty with the loudness of sound and reports volume that most would not be bothered by a very uncomfortable. Treatment of the sound sensitivity is available with a listening program.  For [name],  treatment of the sound sensitivity is strongly recommended with Jacinto Halim, PhD at the [facility] [phone]) or with  [facility] Listening Program (Integrated Listening System).     In the meantime, use background noise to mask unpleasant sound and tinnitus, including using the app made for tinnitus masking (i.e Starkey Relax app). When sound sensitivity is present, it is important that hearing protection be used to protect from loud unexpected sounds, but using hearing protection for extended periods of time in relative quiet is not recommended as this may exacerbate sound sensitivity. Sometimes sounds include an annoyance factor, including other people chewing or breathing sounds.  In these cases it is important to either mask the offending sound with another such as using a fan or white noise, pleasant background noise music or increase distance from the sound thereby reducing volume.   In summary, Central Auditory Processing Disorder (CAPD) creates a hearing difference even when hearing thresholds are  within normal limits. Common characteristics of those with CAPD are insecurity about what is heard and/or auditory fatigue from the extra effort it requires to attempt to hear with faulty processing.   Recommendations: 1. Treatment of the sound sensitivity recommended. Please contact the following professional for further information a) Jacinto Halim, PhD at the [facility] and [employer] (Tel [phone]) and/or b) Integrated Listening Program (iLS) with [employer](Tel [phone])  with the OT here since Fabiha has numerous tactile sensitivities in addition to the hyperacusis.   The following are recommendations to help with sound sensitivity: 1) use hearing protection when around loud noise to protect from noise-induced hearing loss, but do not use hearing protection for extended periods of time in relative quiet.   2) refocus attention away from an offending sound onto something enjoyable.  3)  Have periods of quiet with a quiet place to retreat to during the day to allow optimal auditory rest.    2.  Standard Central Auditory Processing Disorder (CAPD) recommendations, part of a 504 Plan or ADA accommodations:  a) provide written instructions/meeting agenda/notes b)  allow extended test times and c) allow testing in a quiet location such as a quiet office or library (not in the hallway) and d) use of a smart pen for simultaneous recording of and note-taking during work as well as for use during classroom settings.   3.  Consider contacting [facility] for assistance if necessary with obtaining accommodations in work/academic settings and/or with treatment of the sound sensitivity (Tel [phone]).   4.   For optimal hearing in background noise or when a competing message is present at home, work and school:              A) have conversation face to face and maintain eye contact             B) minimize background noise when having a conversation- turn off the TV,  move to a quiet area of the area              C) be aware that auditory processing problems become worse with fatigue and stress so that extra vigilance may be needed to remain involved with conversation              D Avoid having important conversation when Takako 's back is to the speaker.              E) avoid "multitasking" with electronic devices during conversation (i.eBoyd Kerbs without looking at phone, computer, video game, etc).   5.  Strategies to help at work:             A) Speak to [place] face to face, in person or via Skype to provide visual cues, ideally in areas with good room acoustics. Please allow extra time to process and respond.             B).  For employers, optimal communication and conveyance of instructions will be possible with written communication to reinforce details. This may be with a written list, text or email.               C)  Prior to meetings it would be helpful for Jordynn Marcella to know/review what will be covered so that she may be prepared to participate.             D)  After meetings, a summary report of what was discussed or allow Kenly to record meeting for later reference and follow-up is recommended. These minor accommodations will benefit [name].    6.  Music lessons. Current research strongly indicates that learning to play a musical instrument results in improved neurological function related to auditory processing that benefits decoding, dyslexia and hearing in background noise. Therefore is recommended that Ryane learn to play a musical instrument for 1-2 years. Please be aware that being able to play  the instrument well does not seem to matter, the benefit comes with the learning. Please refer to the following website for further info: www.brainvolts at [facility], Davonna Belling, PhD.   7. To improve decoding and hearing in background noise, an online computer based program to help with auditory processing for adults is  LACE by [employer].  8.  To monitor tinnitus, sound sensitivity and word recognition in background noise - repeat audiological evaluation in 6-12 months. Request an earlier audiological evaluation if there are changes or concerns about hearing.   Talen Poser L. Kate Sable, Au.D., CCC-A Doctor of Audiology  07/28/2017

## 2017-07-31 ENCOUNTER — Ambulatory Visit: Payer: BLUE CROSS/BLUE SHIELD | Admitting: Obstetrics & Gynecology

## 2017-09-15 ENCOUNTER — Encounter: Payer: Self-pay | Admitting: Obstetrics & Gynecology

## 2017-09-23 ENCOUNTER — Ambulatory Visit: Payer: BLUE CROSS/BLUE SHIELD | Admitting: Obstetrics & Gynecology

## 2018-01-19 ENCOUNTER — Ambulatory Visit: Payer: Self-pay | Admitting: Registered Nurse

## 2018-01-19 VITALS — BP 111/73 | HR 86 | Temp 98.9°F

## 2018-01-19 DIAGNOSIS — R399 Unspecified symptoms and signs involving the genitourinary system: Secondary | ICD-10-CM

## 2018-01-19 LAB — POCT URINALYSIS DIPSTICK
BILIRUBIN UA: NEGATIVE
BILIRUBIN UA: NEGATIVE mg/dL
Glucose, UA: NEGATIVE
LEUKOCYTES UA: NEGATIVE
Nitrite, UA: POSITIVE — AB
Protein, UA: NEGATIVE
SPEC GRAV UA: 1.01 (ref 1.010–1.025)
Urobilinogen, UA: 0.2 E.U./dL
pH, UA: 7 (ref 5.0–8.0)

## 2018-01-19 MED ORDER — PHENAZOPYRIDINE HCL 95 MG PO TABS: 95.0000 mg | ORAL_TABLET | Freq: Three times a day (TID) | ORAL | 0 refills | Status: AC | PRN

## 2018-01-19 NOTE — Patient Instructions (Signed)
Dysuria Dysuria is pain or discomfort while urinating. The pain or discomfort may be felt in the tube that carries urine out of the bladder (urethra) or in the surrounding tissue of the genitals. The pain may also be felt in the groin area, lower abdomen, and lower back. You may have to urinate frequently or have the sudden feeling that you have to urinate (urgency). Dysuria can affect both men and women, but is more common in women. Dysuria can be caused by many different things, including:  Urinary tract infection in women.  Infection of the kidney or bladder.  Kidney stones or bladder stones.  Certain sexually transmitted infections (STIs), such as chlamydia.  Dehydration.  Inflammation of the vagina.  Use of certain medicines.  Use of certain soaps or scented products that cause irritation.  Follow these instructions at home: Watch your dysuria for any changes. The following actions may help to reduce any discomfort you are feeling:  Drink enough fluid to keep your urine clear or pale yellow.  Empty your bladder often. Avoid holding urine for long periods of time.  After a bowel movement or urination, women should cleanse from front to back, using each tissue only once.  Empty your bladder after sexual intercourse.  Take medicines only as directed by your health care provider.  If you were prescribed an antibiotic medicine, finish it all even if you start to feel better.  Avoid caffeine, tea, and alcohol. They can irritate the bladder and make dysuria worse. In men, alcohol may irritate the prostate.  Keep all follow-up visits as directed by your health care provider. This is important.  If you had any tests done to find the cause of dysuria, it is your responsibility to obtain your test results. Ask the lab or department performing the test when and how you will get your results. Talk with your health care provider if you have any questions about your results.  Contact a  health care provider if:  You develop pain in your back or sides.  You have a fever.  You have nausea or vomiting.  You have blood in your urine.  You are not urinating as often as you usually do. Get help right away if:  You pain is severe and not relieved with medicines.  You are unable to hold down any fluids.  You or someone else notices a change in your mental function.  You have a rapid heartbeat at rest.  You have shaking or chills.  You feel extremely weak. This information is not intended to replace advice given to you by your health care provider. Make sure you discuss any questions you have with your health care provider. Document Released: 11/16/2003 Document Revised: 07/26/2015 Document Reviewed: 10/13/2013 Elsevier Interactive Patient Education  2018 [employer]. Urinary Tract Infection, Adult A urinary tract infection (UTI) is an infection of any part of the urinary tract, which includes the kidneys, ureters, bladder, and urethra. These organs make, store, and get rid of urine in the body. UTI can be a bladder infection (cystitis) or kidney infection (pyelonephritis). What are the causes? This infection may be caused by fungi, viruses, or bacteria. Bacteria are the most common cause of UTIs. This condition can also be caused by repeated incomplete emptying of the bladder during urination. What increases the risk? This condition is more likely to develop if:  You ignore your need to urinate or hold urine for long periods of time.  You do not empty your bladder  completely during urination.  You wipe back to front after urinating or having a bowel movement, if you are female.  You are uncircumcised, if you are female.  You are constipated.  You have a urinary catheter that stays in place (indwelling).  You have a weak defense (immune) system.  You have a medical condition that affects your bowels, kidneys, or bladder.  You have diabetes.  You take  antibiotic medicines frequently or for long periods of time, and the antibiotics no longer work well against certain types of infections (antibiotic resistance).  You take medicines that irritate your urinary tract.  You are exposed to chemicals that irritate your urinary tract.  You are female.  What are the signs or symptoms? Symptoms of this condition include:  Fever.  Frequent urination or passing small amounts of urine frequently.  Needing to urinate urgently.  Pain or burning with urination.  Urine that smells bad or unusual.  Cloudy urine.  Pain in the lower abdomen or back.  Trouble urinating.  Blood in the urine.  Vomiting or being less hungry than normal.  Diarrhea or abdominal pain.  Vaginal discharge, if you are female.  How is this diagnosed? This condition is diagnosed with a medical history and physical exam. You will also need to provide a urine sample to test your urine. Other tests may be done, including:  Blood tests.  Sexually transmitted disease (STD) testing.  If you have had more than one UTI, a cystoscopy or imaging studies may be done to determine the cause of the infections. How is this treated? Treatment for this condition often includes a combination of two or more of the following:  Antibiotic medicine.  Other medicines to treat less common causes of UTI.  Over-the-counter medicines to treat pain.  Drinking enough water to stay hydrated.  Follow these instructions at home:  Take over-the-counter and prescription medicines only as told by your health care provider.  If you were prescribed an antibiotic, take it as told by your health care provider. Do not stop taking the antibiotic even if you start to feel better.  Avoid alcohol, caffeine, tea, and carbonated beverages. They can irritate your bladder.  Drink enough fluid to keep your urine clear or pale yellow.  Keep all follow-up visits as told by your health care provider.  This is important.  Make sure to: ? Empty your bladder often and completely. Do not hold urine for long periods of time. ? Empty your bladder before and after sex. ? Wipe from front to back after a bowel movement if you are female. Use each tissue one time when you wipe. Contact a health care provider if:  You have back pain.  You have a fever.  You feel nauseous or vomit.  Your symptoms do not get better after 3 days.  Your symptoms go away and then return. Get help right away if:  You have severe back pain or lower abdominal pain.  You are vomiting and cannot keep down any medicines or water. This information is not intended to replace advice given to you by your health care provider. Make sure you discuss any questions you have with your health care provider. Document Released: 11/27/2004 Document Revised: 08/01/2015 Document Reviewed: 01/08/2015 Elsevier Interactive Patient Education  [employer].

## 2018-01-19 NOTE — Progress Notes (Signed)
Subjective:    Patient ID: Stephanie Patrick, female    DOB: Jan 26, 1986, 32 y.o.   MRN: [id number]  32y/o female pt new to clinic as new employee. C/o uti sx of suprapubic pressure, burning with urination and malodorous urine since yesterday. Denies urinary frequency, urgency, hematuria, flank pain, fever. Reports hx frequent UTIs, interstitial cystitis. Has leftover abx at home from previous UTIs and began taking Cefdinir at onset of symptoms, but states this has not worked for her previously. Usually takes Bactrim or Macrobid. Is taking AZO. Urine orange in color and affecting dipstick urine testing. Premenstrual typically gets headache, cramping abdomen and swelling hands/feet mild and unsure if related to urinary symptoms or her menstrual cycle supposed to start this week also     Review of Systems  Constitutional: Negative for activity change, appetite change, chills, diaphoresis, fatigue and fever.  HENT: Negative for trouble swallowing and voice change.   Eyes: Negative for photophobia and visual disturbance.  Respiratory: Negative for cough.   Cardiovascular: Positive for leg swelling.  Gastrointestinal: Positive for abdominal pain. Negative for abdominal distention, anal bleeding, blood in stool, diarrhea, nausea and vomiting.  Endocrine: Negative for cold intolerance and heat intolerance.  Genitourinary: Positive for dysuria. Negative for decreased urine volume, difficulty urinating, flank pain, frequency, genital sores, hematuria, urgency, vaginal bleeding and vaginal discharge.  Musculoskeletal: Negative for back pain, gait problem, joint swelling, myalgias, neck pain and neck stiffness.  Skin: Negative for color change, pallor, rash and wound.  Allergic/Immunologic: Negative for environmental allergies and food allergies.  Neurological: Positive for headaches. Negative for dizziness, tremors, seizures, syncope, facial asymmetry, speech difficulty, weakness, light-headedness and  numbness.  Hematological: Negative for adenopathy. Does not bruise/bleed easily.  Psychiatric/Behavioral: Negative for agitation, confusion and sleep disturbance.       Objective:   Physical Exam  Constitutional: She is oriented to person, place, and time. Vital signs are normal. She appears well-developed and well-nourished. She is active and cooperative.  Non-toxic appearance. She does not have a sickly appearance. She does not appear ill. No distress.  HENT:  Head: Normocephalic and atraumatic.  Right Ear: Hearing and external ear normal.  Left Ear: Hearing and external ear normal.  Nose: Nose normal. Right sinus exhibits no maxillary sinus tenderness and no frontal sinus tenderness. Left sinus exhibits no maxillary sinus tenderness and no frontal sinus tenderness.  Mouth/Throat: Uvula is midline, oropharynx is clear and moist and mucous membranes are normal. No oropharyngeal exudate. No tonsillar exudate.  Eyes: Pupils are equal, round, and reactive to light. Conjunctivae, EOM and lids are normal. Right eye exhibits no discharge. Left eye exhibits no discharge. No scleral icterus.  Neck: Trachea normal, normal range of motion and phonation normal. Neck supple. No muscular tenderness present. No neck rigidity. No tracheal deviation, no edema, no erythema and normal range of motion present. No thyromegaly present.  Cardiovascular: Normal rate, regular rhythm, normal heart sounds and intact distal pulses.  Pulses:      Radial pulses are 2+ on the right side, and 2+ on the left side.  No pedal edema  Pulmonary/Chest: Effort normal and breath sounds normal. No stridor. No respiratory distress. She has no decreased breath sounds. She has no wheezes. She has no rhonchi. She has no rales.  No cough observed spoke full sentences without difficulty  Abdominal: Soft. Normal appearance and bowel sounds are normal. She exhibits no shifting dullness, no distension, no pulsatile liver, no fluid wave, no  abdominal bruit, no ascites, no  pulsatile midline mass and no mass. There is no hepatosplenomegaly. There is no tenderness. There is no rigidity, no rebound, no guarding, no CVA tenderness, no tenderness at McBurney's point and negative Murphy's sign. No hernia. Hernia confirmed negative in the ventral area.  Dull to percussion x 4 quads; hypoactive bowel sounds x 4 quads  Musculoskeletal: Normal range of motion. She exhibits no edema, tenderness or deformity.       Right shoulder: Normal.       Left shoulder: Normal.       Right elbow: Normal.      Left elbow: Normal.       Right hip: Normal.       Left hip: Normal.       Right knee: Normal.       Left knee: Normal.       Right ankle: Normal.       Left ankle: Normal.       Cervical back: Normal.       Thoracic back: Normal.       Lumbar back: Normal.       Right hand: Normal.       Left hand: Normal.  Standing to sitting and supine and reverse quickly on exam table/ in exam room without assist  Lymphadenopathy:       Head (right side): No submental, no submandibular, no tonsillar, no preauricular, no posterior auricular and no occipital adenopathy present.       Head (left side): No submental, no submandibular, no tonsillar, no preauricular, no posterior auricular and no occipital adenopathy present.    She has no cervical adenopathy.       Right cervical: No superficial cervical, no deep cervical and no posterior cervical adenopathy present.      Left cervical: No superficial cervical, no deep cervical and no posterior cervical adenopathy present.  Neurological: She is alert and oriented to person, place, and time. She has normal strength. She is not disoriented. She displays no atrophy and no tremor. No cranial nerve deficit or sensory deficit. She exhibits normal muscle tone. She displays no seizure activity. Coordination and gait normal. GCS eye subscore is 4. GCS verbal subscore is 5. GCS motor subscore is 6.  Bilateral hand grasp  equal 5/5 strength; gait sure and steady in hallway  Skin: Skin is warm, dry and intact. Capillary refill takes less than 2 seconds. No rash noted. She is not diaphoretic. No erythema. No pallor.  Psychiatric: She has a normal mood and affect. Her speech is normal and behavior is normal. Judgment and thought content normal. She is not actively hallucinating. Cognition and memory are normal. She is attentive.  Nursing note and vitals reviewed.         Assessment & Plan:  A-dysuria  P-taking otc azo dipstick urinalysis results could be affected as urine bright gold clear.  Sent for urine culture.  Continue cefdinir 300mg  po bid x 3 days at home follow up Thursday if still symptomatic.  Discussed may need 7 day course and I would have to call in Rx/electronic as not carried in clinic.  May continue azo OTC for additional 48 hours.  Push po fluids.  Dipstick urinalysis results discussed with patient today.  No bacteria noted + nitrites and trace blood.  Reviewed previous urine culture results in epic with patient; patient last seen urgent care for dysuria.  Last saw urology 6? Years ago.  Was told her testing came back normal unsure why she was getting  recurrent utis per patient.consider follow up with urology if more than 3 UTIs per year.  exitcare handout on dysuria and UTI. Push noncaffeinated fluids to urine every 4 hours when awake once off azo monitor urine hydration status to keep clear pale yellow tinged. Follow up with provider same day re-evaluation if gross hematuria, worsening  Abdomen pain/back pain, unable to void every 8 hours, cola or tea colored urine or recurrent vomiting  Patient verbalized understanding information/instructions, agreed with plan of care and had no further questions at this time.

## 2018-01-21 LAB — URINE CULTURE: ORGANISM ID, BACTERIA: NO GROWTH

## 2018-02-09 ENCOUNTER — Encounter: Payer: Self-pay | Admitting: Registered Nurse

## 2018-02-09 ENCOUNTER — Ambulatory Visit: Payer: Self-pay | Admitting: Registered Nurse

## 2018-02-09 VITALS — BP 105/79 | HR 93 | Temp 98.7°F

## 2018-02-09 DIAGNOSIS — J019 Acute sinusitis, unspecified: Secondary | ICD-10-CM

## 2018-02-09 DIAGNOSIS — H6501 Acute serous otitis media, right ear: Secondary | ICD-10-CM

## 2018-02-09 NOTE — Patient Instructions (Signed)
Allergic Rhinitis, Adult Allergic rhinitis is an allergic reaction that affects the mucous membrane inside the nose. It causes sneezing, a runny or stuffy nose, and the feeling of mucus going down the back of the throat (postnasal drip). Allergic rhinitis can be mild to severe. There are two types of allergic rhinitis:  Seasonal. This type is also called hay fever. It happens only during certain seasons.  Perennial. This type can happen at any time of the year.  What are the causes? This condition happens when the body's defense system (immune system) responds to certain harmless substances called allergens as though they were germs.  Seasonal allergic rhinitis is triggered by pollen, which can come from grasses, trees, and weeds. Perennial allergic rhinitis may be caused by:  House dust mites.  Pet dander.  Mold spores.  What are the signs or symptoms? Symptoms of this condition include:  Sneezing.  Runny or stuffy nose (nasal congestion).  Postnasal drip.  Itchy nose.  Tearing of the eyes.  Trouble sleeping.  Daytime sleepiness.  How is this diagnosed? This condition may be diagnosed based on:  Your medical history.  A physical exam.  Tests to check for related conditions, such as: ? Asthma. ? Pink eye. ? Ear infection. ? Upper respiratory infection.  Tests to find out which allergens trigger your symptoms. These may include skin or blood tests.  How is this treated? There is no cure for this condition, but treatment can help control symptoms. Treatment may include:  Taking medicines that block allergy symptoms, such as antihistamines. Medicine may be given as a shot, nasal spray, or pill.  Avoiding the allergen.  Desensitization. This treatment involves getting ongoing shots until your body becomes less sensitive to the allergen. This treatment may be done if other treatments do not help.  If taking medicine and avoiding the allergen does not work, new,  stronger medicines may be prescribed.  Follow these instructions at home:  Find out what you are allergic to. Common allergens include smoke, dust, and pollen.  Avoid the things you are allergic to. These are some things you can do to help avoid allergens: ? Replace carpet with wood, tile, or vinyl flooring. Carpet can trap dander and dust. ? Do not smoke. Do not allow smoking in your home. ? Change your heating and air conditioning filter at least once a month. ? During allergy season:  Keep windows closed as much as possible.  Plan outdoor activities when pollen counts are lowest. This is usually during the evening hours.  When coming indoors, change clothing and shower before sitting on furniture or bedding.  Take over-the-counter and prescription medicines only as told by your health care provider.  Keep all follow-up visits as told by your health care provider. This is important. Contact a health care provider if:  You have a fever.  You develop a persistent cough.  You make whistling sounds when you breathe (you wheeze).  Your symptoms interfere with your normal daily activities. Get help right away if:  You have shortness of breath. Summary  This condition can be managed by taking medicines as directed and avoiding allergens.  Contact your health care provider if you develop a persistent cough or fever.  During allergy season, keep windows closed as much as possible. This information is not intended to replace advice given to you by your health care provider. Make sure you discuss any questions you have with your health care provider. Document Released: 11/12/2000 Document Revised: 03/27/2016  Document Reviewed: 03/27/2016 Elsevier Interactive Patient Education  2018 [employer]. Nonallergic Rhinitis Nonallergic rhinitis is a condition that causes symptoms that affect the nose, such as a runny nose and a stuffed-up nose (nasal congestion) that can make it hard to  breathe through the nose. This condition is different from having an allergy (allergic rhinitis). Allergic rhinitis occurs when the body's defense system (immune system) reacts to a substance that you are allergic to (allergen), such as pollen, pet dander, mold, or dust. Nonallergic rhinitis has many similar symptoms, but it is not caused by allergens. Nonallergic rhinitis can be a short-term or long-term problem. What are the causes? This condition can be caused by many different things. Some common types of nonallergic rhinitis include: Infectious rhinitis  This is usually due to an infection in the upper respiratory tract. Vasomotor rhinitis  This is the most common type of long-term nonallergic rhinitis.  It is caused by too much blood flow through the nose, which makes the tissue inside of the nose swell.  Symptoms are often triggered by strong odors, cold air, stress, drinking alcohol, cigarette smoke, or changes in the weather. Occupational rhinitis  This type is caused by triggers in the workplace, such as chemicals, dusts, animal dander, or air pollution. Hormonal rhinitis  This type occurs in women as a result of an increase in the female hormone estrogen.  It may occur during pregnancy, puberty, and menstrual cycles.  Symptoms improve when estrogen levels drop. Drug-induced rhinitis Several drugs can cause nonallergic rhinitis, including:  Medicines that are used to treat high blood pressure, heart disease, and Parkinson disease.  Aspirin and NSAIDs.  Over-the-counter nasal decongestant sprays. These can cause a type of nonallergic rhinitis (rhinitis medicamentosa) when they are used for more than a few days.  Nonallergic rhinitis with eosinophilia syndrome (NARES)  This type is caused by having too much of a certain type of white blood cell (eosinophil). Nonallergic rhinitis can also be caused by a reaction to eating hot or spicy foods. This does not usually cause  long-term symptoms. In some cases, the cause of nonallergic rhinitis is not known. What increases the risk? You are more likely to develop this condition if:  You are 32-32 years of age.  You are a woman. Women are twice as likely to have this condition.  What are the signs or symptoms? Common symptoms of this condition include:  Nasal congestion.  Runny nose.  The feeling of mucus going down the back of the throat (postnasal drip).  Trouble sleeping at night and daytime sleepiness.  Less common symptoms include:  Sneezing.  Coughing.  Itchy nose.  Bloodshot eyes.  How is this diagnosed? This condition may be diagnosed based on:  Your symptoms and medical history.  A physical exam.  Allergy testing to rule out allergic rhinitis. You may have skin tests or blood tests.  In some cases, the health care provider may take a swab of nasal secretions to look for an increased number of eosinophils. This would be done to confirm a diagnosis of NARES. How is this treated? Treatment for this condition depends on the cause. No single treatment works for everyone. Work with your health care provider to find the best treatment for you. Treatment may include:  Avoiding the things that trigger your symptoms.  Using medicines to relieve congestion, such as: ? Steroid nasal spray. There are many types. You may need to try a few to find out which one works best. ? Decongestant  medicine. This may be an oral medicine or a nasal spray. These medicines are only used for a short time.  Using medicines to relieve a runny nose. These may include antihistamine medicines or anticholinergic nasal sprays.  Surgery to remove tissue from inside the nose may be needed in severe cases if the condition has not improved after 6-12 months of medical treatment. Follow these instructions at home:  Take or use over-the-counter and prescription medicines only as told by your health care provider. Do not  stop using your medicine even if you start to feel better.  Use salt-water (saline) rinses or other solutions (nasal washes or irrigations) to wash or rinse out the inside of your nose as told by your health care provider.  Do not take NSAIDs or medicines that contain aspirin if they make your symptoms worse.  Do not drink alcohol if it makes your symptoms worse.  Do not use any tobacco products, such as cigarettes, chewing tobacco, and e-cigarettes. If you need help quitting, ask your health care provider.  Avoid secondhand smoke.  Get some exercise every day. Exercise may help reduce symptoms of nonallergic rhinitis for some people. Ask your health care provider how much exercise and what types of exercise are safe for you.  Sleep with the head of your bed raised (elevated). This may reduce nighttime nasal congestion.  Keep all follow-up visits as told by your health care provider. This is important. Contact a health care provider if:  You have a fever.  Your symptoms are getting worse at home.  Your symptoms are not responding to medicine.  You develop new symptoms, especially a headache or nosebleed. This information is not intended to replace advice given to you by your health care provider. Make sure you discuss any questions you have with your health care provider. Document Released: 06/11/2015 Document Revised: 07/26/2015 Document Reviewed: 05/10/2015 Elsevier Interactive Patient Education  2018 [employer]. Otitis Media, Adult Otitis media occurs when there is inflammation and fluid in the middle ear. Your middle ear is a part of the ear that contains bones for hearing as well as air that helps send sounds to your brain. What are the causes? This condition is caused by a blockage in the eustachian tube. This tube drains fluid from the ear to the back of the nose (nasopharynx). A blockage in this tube can be caused by an object or by swelling (edema) in the tube. Problems  that can cause a blockage include:  A cold or other upper respiratory infection.  Allergies.  An irritant, such as tobacco smoke.  Enlarged adenoids. The adenoids are areas of soft tissue located high in the back of the throat, behind the nose and the roof of the mouth.  A mass in the nasopharynx.  Damage to the ear caused by pressure changes (barotrauma).  What are the signs or symptoms? Symptoms of this condition include:  Ear pain.  A fever.  Decreased hearing.  A headache.  Tiredness (lethargy).  Fluid leaking from the ear.  Ringing in the ear.  How is this diagnosed? This condition is diagnosed with a physical exam. During the exam your health care provider will use an instrument called an otoscope to look into your ear and check for redness, swelling, and fluid. He or she will also ask about your symptoms. Your health care provider may also order tests, such as:  A test to check the movement of the eardrum (pneumatic otoscopy). This test is done by  squeezing a small amount of air into the ear.  A test that changes air pressure in the middle ear to check how well the eardrum moves and whether the eustachian tube is working (tympanogram).  How is this treated? This condition usually goes away on its own within 3-5 days. But if the condition is caused by a bacteria infection and does not go away own its own, or keeps coming back, your health care provider may:  Prescribe antibiotic medicines to treat the infection.  Prescribe or recommend medicines to control pain.  Follow these instructions at home:  Take over-the-counter and prescription medicines only as told by your health care provider.  If you were prescribed an antibiotic medicine, take it as told by your health care provider. Do not stop taking the antibiotic even if you start to feel better.  Keep all follow-up visits as told by your health care provider. This is important. Contact a health care  provider if:  You have bleeding from your nose.  There is a lump on your neck.  You are not getting better in 5 days.  You feel worse instead of better. Get help right away if:  You have severe pain that is not controlled with medicine.  You have swelling, redness, or pain around your ear.  You have stiffness in your neck.  A part of your face is paralyzed.  The bone behind your ear (mastoid) is tender when you touch it.  You develop a severe headache. Summary  Otitis media is redness, soreness, and swelling of the middle ear.  This condition usually goes away on its own within 3-5 days.  If the problem does not go away in 3-5 days, your health care provider may prescribe or recommend medicines to treat your symptoms.  If you were prescribed an antibiotic medicine, take it as told by your health care provider. This information is not intended to replace advice given to you by your health care provider. Make sure you discuss any questions you have with your health care provider. Document Released: 11/23/2003 Document Revised: 02/08/2016 Document Reviewed: 02/08/2016 Elsevier Interactive Patient Education  2018 [employer]. Sinusitis, Adult Sinusitis is soreness and inflammation of your sinuses. Sinuses are hollow spaces in the bones around your face. Your sinuses are located:  Around your eyes.  In the middle of your forehead.  Behind your nose.  In your cheekbones.  Your sinuses and nasal passages are lined with a stringy fluid (mucus). Mucus normally drains out of your sinuses. When your nasal tissues become inflamed or swollen, the mucus can become trapped or blocked so air cannot flow through your sinuses. This allows bacteria, viruses, and funguses to grow, which leads to infection. Sinusitis can develop quickly and last for 7?10 days (acute) or for more than 12 weeks (chronic). Sinusitis often develops after a cold. What are the causes? This condition is caused  by anything that creates swelling in the sinuses or stops mucus from draining, including:  Allergies.  Asthma.  Bacterial or viral infection.  Abnormally shaped bones between the nasal passages.  Nasal growths that contain mucus (nasal polyps).  Narrow sinus openings.  Pollutants, such as chemicals or irritants in the air.  A foreign object stuck in the nose.  A fungal infection. This is rare.  What increases the risk? The following factors may make you more likely to develop this condition:  Having allergies or asthma.  Having had a recent cold or respiratory tract infection.  Having  structural deformities or blockages in your nose or sinuses.  Having a weak immune system.  Doing a lot of swimming or diving.  Overusing nasal sprays.  Smoking.  What are the signs or symptoms? The main symptoms of this condition are pain and a feeling of pressure around the affected sinuses. Other symptoms include:  Upper toothache.  Earache.  Headache.  Bad breath.  Decreased sense of smell and taste.  A cough that may get worse at night.  Fatigue.  Fever.  Thick drainage from your nose. The drainage is often green and it may contain pus (purulent).  Stuffy nose or congestion.  Postnasal drip. This is when extra mucus collects in the throat or back of the nose.  Swelling and warmth over the affected sinuses.  Sore throat.  Sensitivity to light.  How is this diagnosed? This condition is diagnosed based on symptoms, a medical history, and a physical exam. To find out if your condition is acute or chronic, your health care provider may:  Look in your nose for signs of nasal polyps.  Tap over the affected sinus to check for signs of infection.  View the inside of your sinuses using an imaging device that has a light attached (endoscope).  If your health care provider suspects that you have chronic sinusitis, you may also:  Be tested for allergies.  Have a  sample of mucus taken from your nose (nasal culture) and checked for bacteria.  Have a mucus sample examined to see if your sinusitis is related to an allergy.  If your sinusitis does not respond to treatment and it lasts longer than 8 weeks, you may have an MRI or CT scan to check your sinuses. These scans also help to determine how severe your infection is. In rare cases, a bone biopsy may be done to rule out more serious types of fungal sinus disease. How is this treated? Treatment for sinusitis depends on the cause and whether your condition is chronic or acute. If a virus is causing your sinusitis, your symptoms will go away on their own within 10 days. You may be given medicines to relieve your symptoms, including:  Topical nasal decongestants. They shrink swollen nasal passages and let mucus drain from your sinuses.  Antihistamines. These drugs block inflammation that is triggered by allergies. This can help to ease swelling in your nose and sinuses.  Topical nasal corticosteroids. These are nasal sprays that ease inflammation and swelling in your nose and sinuses.  Nasal saline washes. These rinses can help to get rid of thick mucus in your nose.  If your condition is caused by bacteria, you will be given an antibiotic medicine. If your condition is caused by a fungus, you will be given an antifungal medicine. Surgery may be needed to correct underlying conditions, such as narrow nasal passages. Surgery may also be needed to remove polyps. Follow these instructions at home: Medicines  Take, use, or apply over-the-counter and prescription medicines only as told by your health care provider. These may include nasal sprays.  If you were prescribed an antibiotic medicine, take it as told by your health care provider. Do not stop taking the antibiotic even if you start to feel better. Hydrate and Humidify  Drink enough water to keep your urine clear or pale yellow. Staying hydrated will  help to thin your mucus.  Use a cool mist humidifier to keep the humidity level in your home above 50%.  Inhale steam for 10-15 minutes, 3-4  times a day or as told by your health care provider. You can do this in the bathroom while a hot shower is running.  Limit your exposure to cool or dry air. Rest  Rest as much as possible.  Sleep with your head raised (elevated).  Make sure to get enough sleep each night. General instructions  Apply a warm, moist washcloth to your face 3-4 times a day or as told by your health care provider. This will help with discomfort.  Wash your hands often with soap and water to reduce your exposure to viruses and other germs. If soap and water are not available, use hand sanitizer.  Do not smoke. Avoid being around people who are smoking (secondhand smoke).  Keep all follow-up visits as told by your health care provider. This is important. Contact a health care provider if:  You have a fever.  Your symptoms get worse.  Your symptoms do not improve within 10 days. Get help right away if:  You have a severe headache.  You have persistent vomiting.  You have pain or swelling around your face or eyes.  You have vision problems.  You develop confusion.  Your neck is stiff.  You have trouble breathing. This information is not intended to replace advice given to you by your health care provider. Make sure you discuss any questions you have with your health care provider. Document Released: 02/17/2005 Document Revised: 10/14/2015 Document Reviewed: 12/13/2014 Elsevier Interactive Patient Education  2018 [employer] Sinus Rinse What is a sinus rinse? A sinus rinse is a simple home treatment that is used to rinse your sinuses with a sterile mixture of salt and water (saline solution). Sinuses are air-filled spaces in your skull behind the bones of your face and forehead that open into your nasal cavity. You will use the following:  Saline  solution.  Neti pot or spray bottle. This releases the saline solution into your nose and through your sinuses. Neti pots and spray bottles can be purchased at [employer], a health food store, or online.  When would I do a sinus rinse? A sinus rinse can help to clear mucus, dirt, dust, or pollen from the nasal cavity. You may do a sinus rinse when you have a cold, a virus, nasal allergy symptoms, a sinus infection, or stuffiness in the nose or sinuses. If you are considering a sinus rinse:  Ask your child's health care provider before performing a sinus rinse on your child.  Do not do a sinus rinse if you have had ear or nasal surgery, ear infection, or blocked ears.  How do I do a sinus rinse?  Wash your hands.  Disinfect your device according to the directions provided and then dry it.  Use the solution that comes with your device or one that is sold separately in stores. Follow the mixing directions on the package.  Fill your device with the amount of saline solution as directed by the device instructions.  Stand over a sink and tilt your head sideways over the sink.  Place the spout of the device in your upper nostril (the one closer to the ceiling).  Gently pour or squeeze the saline solution into the nasal cavity. The liquid should drain to the lower nostril if you are not overly congested.  Gently blow your nose. Blowing too hard may cause ear pain.  Repeat in the other nostril.  Clean and rinse your device with clean water and then air-dry it. Are  there risks of a sinus rinse? Sinus rinse is generally very safe and effective. However, there are a few risks, which include:  A burning sensation in the sinuses. This may happen if you do not make the saline solution as directed. Make sure to follow all directions when making the saline solution.  Infection from contaminated water. This is rare, but possible.  Nasal irritation.  This information is not intended  to replace advice given to you by your health care provider. Make sure you discuss any questions you have with your health care provider. Document Released: 09/14/2013 Document Revised: 01/15/2016 Document Reviewed: 07/05/2013 Elsevier Interactive Patient Education  2017 [employer].

## 2018-02-09 NOTE — Progress Notes (Signed)
Subjective:    Patient ID: Stephanie Patrick, female    DOB: 05/08/1985, 32 y.o.   MRN: [id number]  32y/o recently established female pt c/o sore throat, dry nonproductive cough, and generalized body aches, fatigue x2 days. Bilateral otalgia. Denies ear drainage or hearing changes. No OTCs at home. Tylenol 500mg  from clinic stock this am with mild relief. C/o anterior throat hurting + sick contacts at work  Moving from her apt in [place] to [place] this month currently staying with friend in new apt without pets.     Review of Systems  Constitutional: Positive for fatigue. Negative for activity change, appetite change, chills, diaphoresis, fever and unexpected weight change.  HENT: Positive for congestion, ear pain, postnasal drip, rhinorrhea, sinus pressure, sinus pain and sore throat. Negative for dental problem, drooling, ear discharge, facial swelling, hearing loss, mouth sores, nosebleeds, sneezing, tinnitus, trouble swallowing and voice change.   Eyes: Negative for photophobia, pain, discharge, redness, itching and visual disturbance.  Respiratory: Positive for cough. Negative for choking, chest tightness, shortness of breath, wheezing and stridor.   Cardiovascular: Negative for chest pain, palpitations and leg swelling.  Gastrointestinal: Negative for abdominal distention, abdominal pain, diarrhea, nausea and vomiting.  Endocrine: Negative for cold intolerance and heat intolerance.  Genitourinary: Negative for difficulty urinating and dysuria.  Musculoskeletal: Positive for myalgias. Negative for arthralgias, back pain, gait problem, joint swelling, neck pain and neck stiffness.  Skin: Negative for color change, pallor, rash and wound.  Allergic/Immunologic: Positive for environmental allergies. Negative for food allergies.  Neurological: Negative for dizziness, tremors, seizures, syncope, facial asymmetry, speech difficulty, weakness, light-headedness, numbness and headaches.   Hematological: Negative for adenopathy. Does not bruise/bleed easily.  Psychiatric/Behavioral: Negative for agitation, behavioral problems, confusion and sleep disturbance.       Objective:   Physical Exam  Constitutional: She is oriented to person, place, and time. Vital signs are normal. She appears well-developed and well-nourished. She is active and cooperative.  Non-toxic appearance. She does not have a sickly appearance. She appears ill. No distress.  HENT:  Head: Normocephalic and atraumatic.  Right Ear: Hearing, external ear and ear canal normal. There is tenderness. No drainage or swelling. Tympanic membrane is erythematous and bulging. A middle ear effusion is present. No decreased hearing is noted.  Left Ear: Hearing, external ear and ear canal normal. There is tenderness. No drainage or swelling. A middle ear effusion is present. No decreased hearing is noted.  Nose: Mucosal edema and rhinorrhea present. No nose lacerations, sinus tenderness, nasal deformity, septal deviation or nasal septal hematoma. No epistaxis.  No foreign bodies. Right sinus exhibits maxillary sinus tenderness. Right sinus exhibits no frontal sinus tenderness. Left sinus exhibits maxillary sinus tenderness. Left sinus exhibits no frontal sinus tenderness.  Mouth/Throat: Uvula is midline and mucous membranes are normal. Mucous membranes are not pale, not dry and not cyanotic. She does not have dentures. No oral lesions. No trismus in the jaw. Normal dentition. No dental abscesses, uvula swelling, lacerations or dental caries. Posterior oropharyngeal edema and posterior oropharyngeal erythema present. No oropharyngeal exudate or tonsillar abscesses. Tonsils are 0 on the right. Tonsils are 0 on the left. No tonsillar exudate.  Eyes: Pupils are equal, round, and reactive to light. Conjunctivae, EOM and lids are normal. Right eye exhibits no chemosis, no discharge, no exudate and no hordeolum. No foreign body present in  the right eye. Left eye exhibits no chemosis, no discharge, no exudate and no hordeolum. No foreign body present in the  left eye. Right conjunctiva is not injected. Right conjunctiva has no hemorrhage. Left conjunctiva is not injected. Left conjunctiva has no hemorrhage. No scleral icterus. Right eye exhibits normal extraocular motion and no nystagmus. Left eye exhibits normal extraocular motion and no nystagmus. Right pupil is round and reactive. Left pupil is round and reactive. Pupils are equal.  Neck: Trachea normal and normal range of motion. Neck supple. No tracheal tenderness, no spinous process tenderness and no muscular tenderness present. No neck rigidity. No tracheal deviation, no edema, no erythema and normal range of motion present. No thyroid mass and no thyromegaly present.  Cardiovascular: Normal rate, regular rhythm, S1 normal, S2 normal, normal heart sounds and intact distal pulses. PMI is not displaced. Exam reveals no gallop and no friction rub.  No murmur heard. Pulmonary/Chest: Effort normal and breath sounds normal. No accessory muscle usage or stridor. No respiratory distress. She has no decreased breath sounds. She has no wheezes. She has no rhonchi. She has no rales. She exhibits no tenderness.  Intermittent nonproductive cough in exam room; spoke full sentences without difficulty  Abdominal: Soft. She exhibits no distension. There is no guarding.  Musculoskeletal: Normal range of motion. She exhibits no edema or tenderness.       Right shoulder: Normal.       Left shoulder: Normal.       Right hip: Normal.       Left hip: Normal.       Right knee: Normal.       Left knee: Normal.       Cervical back: Normal.       Right hand: Normal.       Left hand: Normal.  Lymphadenopathy:       Head (right side): No submental, no submandibular, no tonsillar, no preauricular, no posterior auricular and no occipital adenopathy present.       Head (left side): No submental, no  submandibular, no tonsillar, no preauricular, no posterior auricular and no occipital adenopathy present.    She has no cervical adenopathy.       Right cervical: No superficial cervical, no deep cervical and no posterior cervical adenopathy present.      Left cervical: No superficial cervical, no deep cervical and no posterior cervical adenopathy present.  Neurological: She is alert and oriented to person, place, and time. She has normal strength. She is not disoriented. She displays no atrophy and no tremor. No cranial nerve deficit or sensory deficit. She exhibits normal muscle tone. She displays no seizure activity. Coordination and gait normal. GCS eye subscore is 4. GCS verbal subscore is 5. GCS motor subscore is 6.  Skin: Skin is warm, dry and intact. Capillary refill takes less than 2 seconds. No abrasion, no bruising, no burn, no ecchymosis, no laceration, no lesion, no petechiae and no rash noted. She is not diaphoretic. No cyanosis or erythema. No pallor. Nails show no clubbing.  Psychiatric: She has a normal mood and affect. Her speech is normal and behavior is normal. Judgment and thought content normal. Cognition and memory are normal.  Nursing note and vitals reviewed.    Maxillary sinuses TTP bilaterally, cobblestoning posterior pharynx; right TM erythema 9 oclock; bilateral air fluid level clear TMs; bilateral allergic shiners, clear discharge bilateral nasal turbinates edema/erythema     Assessment & Plan:  A-right acute otitis media, acute rhinosinusitis  P-Supportive treatment. Augmentin 875mg  po BID x 10 days #20 RF0 dispensed from [facility] to patient  Tylenol 1000mg  po QID  prn pain/fever.   No evidence of invasive bacterial infection, non toxic and well hydrated.  This is most likely self limiting viral infection.  I do not see where any further testing or imaging is necessary at this time.   I will suggest supportive care, rest, good hygiene and encourage the patient to take  adequate fluids.  The patient is to return to clinic or EMERGENCY ROOM if symptoms worsen or change significantly e.g. ear pain, fever, purulent discharge from ears or bleeding.  Exitcare handout on otitis media   Patient verbalized agreement and understanding of treatment plan.    Can't handle nose sprays that drain in a stream.  Discussed probable viral but could be allergic or cold weather rhinitis that has led to sinus inflammation.  Living with someone currently otherwise she is commuting from [facility] new apt no pets and plan to rent new appt of her own this month  Tylenol 1000mg  po QID prn pain.  Cough lozenges po q2h prn cough.  Discussed humidifier/air filtration unit and.discussed nasal saline initiation in the shower using light squeeze to dispense mist versus gush of saline and sniff up when exposed to dust..  She has phenylephrine 10mg  po q6h prn rhinitis/pnd at home and given 4 UD from clinic stock today. Consider OTC antihistamine zyrtec/claritin 10mg  po daily.   saline 2 sprays each nostril q2h wa prn congestion given 1 bottle from clinic stock  Rx given augmentin 875mg  po BID x 10 days #20 RF0 dispensed from [facility] to patient due to otitis media acute also covers for bacterial sinusitis but will not help allergic or viral sinusitis  Denied personal or family history of ENT cancer.  Shower BID especially prior to bed. No evidence of systemic bacterial infection, non toxic and well hydrated.  I do not see where any further testing or imaging is necessary at this time.   I will suggest supportive care, rest, good hygiene and encourage the patient to take adequate fluids.  The patient is to return to clinic or EMERGENCY ROOM if symptoms worsen or change significantly.  Exitcare handout on allergic rhiniitis, nonallergic rhinitis, sinusitis and sinus rinse.  Patient verbalized agreement and understanding of treatment plan and had no further questions at this time.   P2:  Hand washing and cover  cough

## 2018-02-11 ENCOUNTER — Ambulatory Visit: Payer: Self-pay | Admitting: *Deleted

## 2018-02-11 VITALS — BP 110/73 | Ht 64.0 in | Wt 182.0 lb

## 2018-02-11 DIAGNOSIS — Z0000 Encounter for general adult medical examination without abnormal findings: Secondary | ICD-10-CM

## 2018-02-11 NOTE — Progress Notes (Signed)
[employer] insurance premium discount evaluation: Labs Drawn. Replacements ROI form signed. Tobacco Free Attestation form signed.  Forms placed in paper chart.  

## 2018-02-12 LAB — CMP12+LP+TP+TSH+6AC+CBC/D/PLT
ALT: 25 IU/L (ref 0–32)
AST: 21 IU/L (ref 0–40)
Albumin/Globulin Ratio: 1.9 (ref 1.2–2.2)
Albumin: 4.6 g/dL (ref 3.5–5.5)
Alkaline Phosphatase: 96 IU/L (ref 39–117)
BASOS: 1 %
BILIRUBIN TOTAL: 0.4 mg/dL (ref 0.0–1.2)
BUN / CREAT RATIO: 14 (ref 9–23)
BUN: 9 mg/dL (ref 6–20)
Basophils Absolute: 0 10*3/uL (ref 0.0–0.2)
CALCIUM: 9.5 mg/dL (ref 8.7–10.2)
CHLORIDE: 103 mmol/L (ref 96–106)
Chol/HDL Ratio: 4 ratio (ref 0.0–4.4)
Cholesterol, Total: 186 mg/dL (ref 100–199)
Creatinine, Ser: 0.64 mg/dL (ref 0.57–1.00)
EOS (ABSOLUTE): 0.1 10*3/uL (ref 0.0–0.4)
EOS: 1 %
ESTIMATED CHD RISK: 0.8 times avg. (ref 0.0–1.0)
Free Thyroxine Index: 2.5 (ref 1.2–4.9)
GFR calc Af Amer: 137 mL/min/{1.73_m2} (ref >59)
GFR, EST NON AFRICAN AMERICAN: 118 mL/min/{1.73_m2} (ref >59)
GGT: 41 IU/L (ref 0–60)
GLUCOSE: 99 mg/dL (ref 65–99)
Globulin, Total: 2.4 g/dL (ref 1.5–4.5)
HDL: 47 mg/dL (ref >39)
HEMATOCRIT: 43.5 % (ref 34.0–46.6)
HEMOGLOBIN: 14.6 g/dL (ref 11.1–15.9)
IRON: 101 ug/dL (ref 27–159)
Immature Grans (Abs): 0 10*3/uL (ref 0.0–0.1)
Immature Granulocytes: 1 %
LDH: 165 IU/L (ref 119–226)
LDL CALC: 121 mg/dL — AB (ref 0–99)
LYMPHS ABS: 1.6 10*3/uL (ref 0.7–3.1)
Lymphs: 25 %
MCH: 30.2 pg (ref 26.6–33.0)
MCHC: 33.6 g/dL (ref 31.5–35.7)
MCV: 90 fL (ref 79–97)
Monocytes Absolute: 0.5 10*3/uL (ref 0.1–0.9)
Monocytes: 8 %
Neutrophils Absolute: 4.2 10*3/uL (ref 1.4–7.0)
Neutrophils: 64 %
Phosphorus: 3.2 mg/dL (ref 2.5–4.5)
Platelets: 323 10*3/uL (ref 150–450)
Potassium: 4.4 mmol/L (ref 3.5–5.2)
RBC: 4.84 x10E6/uL (ref 3.77–5.28)
RDW: 12.5 % (ref 12.3–15.4)
SODIUM: 143 mmol/L (ref 134–144)
T3 UPTAKE RATIO: 28 % (ref 24–39)
T4, Total: 8.8 ug/dL (ref 4.5–12.0)
TOTAL PROTEIN: 7 g/dL (ref 6.0–8.5)
TSH: 2.35 u[IU]/mL (ref 0.450–4.500)
Triglycerides: 89 mg/dL (ref 0–149)
URIC ACID: 6.4 mg/dL (ref 2.5–7.1)
VLDL CHOLESTEROL CAL: 18 mg/dL (ref 5–40)
WBC: 6.4 10*3/uL (ref 3.4–10.8)

## 2018-02-12 LAB — HGB A1C W/O EAG: HEMOGLOBIN A1C: 5.4 % (ref 4.8–5.6)

## 2018-02-18 ENCOUNTER — Encounter: Payer: Self-pay | Admitting: Registered Nurse

## 2018-02-18 ENCOUNTER — Ambulatory Visit: Payer: Self-pay | Admitting: Registered Nurse

## 2018-02-18 VITALS — BP 113/81 | HR 78 | Temp 98.6°F

## 2018-02-18 DIAGNOSIS — N926 Irregular menstruation, unspecified: Secondary | ICD-10-CM

## 2018-02-18 DIAGNOSIS — Z3200 Encounter for pregnancy test, result unknown: Secondary | ICD-10-CM

## 2018-02-18 NOTE — Patient Instructions (Signed)
Common Medications Safe in Pregnancy  Acne:      Constipation:  Benzoyl Peroxide     Colace  Clindamycin      Dulcolax Suppository  Topica Erythromycin     Fibercon  Salicylic Acid      Metamucil         Miralax AVOID:        Senakot   Accutane    Cough:  Retin-A       Cough Drops  Tetracycline      Phenergan w/ Codeine if Rx  Minocycline      Robitussin (Plain & DM)  Antibiotics:     Crabs/Lice:  Ceclor       RID  Cephalosporins    AVOID:  E-Mycins      Kwell  Keflex  Macrobid/Macrodantin   Diarrhea:  Penicillin      Kao-Pectate  Zithromax      Imodium AD         PUSH FLUIDS AVOID:       Cipro     Fever:  Tetracycline      Tylenol (Regular or Extra  Minocycline       Strength)  Levaquin      Extra Strength-Do not          Exceed 8 tabs/24 hrs Caffeine:        <200mg/day (equiv. To 1 cup of coffee or  approx. 3 12 oz sodas)         Gas: Cold/Hayfever:       Gas-X  Benadryl      Mylicon  Claritin       Phazyme  **Claritin-D        Chlor-Trimeton    Headaches:  Dimetapp      ASA-Free Excedrin  Drixoral-Non-Drowsy     Cold Compress  Mucinex (Guaifenasin)     Tylenol (Regular or Extra  Sudafed/Sudafed-12 Hour     Strength)  **Sudafed PE Pseudoephedrine   Tylenol Cold & Sinus     Vicks Vapor Rub  Zyrtec  **AVOID if Problems With Blood Pressure         Heartburn: Avoid lying down for at least 1 hour after meals  Aciphex      Maalox     Rash:  Milk of Magnesia     Benadryl    Mylanta       1% Hydrocortisone Cream  Pepcid  Pepcid Complete   Sleep Aids:  Prevacid      Ambien   Prilosec       Benadryl  Rolaids       Chamomile Tea  Tums (Limit 4/day)     Unisom  Zantac       Tylenol PM         Warm milk-add vanilla or  Hemorrhoids:       Sugar for taste  Anusol/Anusol H.C.  (RX: Analapram 2.5%)  Sugar Substitutes:  Hydrocortisone OTC     Ok in moderation  Preparation H      Tucks        Vaseline lotion applied to tissue with  wiping    Herpes:     Throat:  Acyclovir      Oragel  Famvir  Valtrex     Vaccines:         Flu Shot Leg Cramps:       *Gardasil  Benadryl      Hepatitis A         Hepatitis B Nasal Spray:         Pneumovax  Saline Nasal Spray     Polio Booster         Tetanus Nausea:       Tuberculosis test or PPD  Vitamin B6 25 mg TID   AVOID:    Dramamine      *Gardasil  Emetrol       Live Poliovirus  Ginger Root 250 mg QID    MMR (measles, mumps &  High Complex Carbs @ Bedtime    rebella)  Sea Bands-Accupressure    Varicella (Chickenpox)  Unisom 1/2 tab TID     *No known complications           If received before Pain:         Known pregnancy;   Darvocet       Resume series after  Lortab        Delivery  Percocet    Yeast:   Tramadol      Femstat  Tylenol 3      Gyne-lotrimin  Ultram       Monistat  Vicodin           MISC:         All Sunscreens           Hair Coloring/highlights          Insect Repellant's          (Including DEET)         Mystic Tans Preventing Pregnancy, Adult Pregnancy can occur any time you have sex. You can even become pregnant if you do not have a regular period or when you are breastfeeding. Using a form of birth control (contraception) that is best for you can help prevent pregnancy. Talk to your health care provider about the options available to you for preventing pregnancy. Work together to make a decision that is right for you based on your health, lifestyle, values, and preferences. What are options for pregnancy prevention? The only way to completely prevent pregnancy is not to have sex (practice abstinence). If you choose to be sexually active, you can use birth control every time you have sex. Birth control must be used exactly as prescribed by your health care provider, or as recommended by instructions on the package. You may consider the following options for birth control: Reversible prevention   Using a long-acting, reversible form of birth control,  such as: ? An intrauterine device (IUD). ? An implantable or injectable hormonal birth control.  Taking birth control pills by mouth (oral pills).  Using a condom. These are most effective when used with another form of birth control, such as birth control pills or an IUD. Condoms also help protect against STIs (sexually transmitted infections).  Learning the signs of fertility and avoiding sex when you notice these signs. Signs may include: ? Increased vaginal discharge. ? Slight changes in body temperature.  Practicing natural family planning (rhythm method). This is the least effective method of preventing pregnancy. This option relies on knowing when you are most likely to release an egg (ovulate) and be most fertile. To be most effective, these methods must be used exactly as told by your health care provider. If you decide that you want to become pregnant, you can stop any of these methods at any time. Permanent prevention  A surgical procedure to prevent pregnancy permanently (sterilization). In this surgery, the fallopian tubes are either blocked or closed off. This prevents eggs from reaching the uterus. Emergency prevention  Using emergency birth control  as needed. This is to be used if you have sex without using birth control and you are concerned that you might be pregnant. Emergency birth control can be purchased from a pharmacy without a prescription. It can prevent pregnancy if taken up to 72 hours after having unprotected sex. ? If you have questions about emergency birth control, ask your health care provider. ? Emergency birth control should not be used on a regular basis. Where to find support You may be able to get support for preventing pregnancy from:  Clinics and health care providers who can educate you about birth control options. Some clinics offer services whose prices vary based on financial need (sliding scale). Most clinics take health insurance.  A clinic  that offers reproductive services. You can find a clinic near you through the Department of Health and Human Services: BondedCompany.at Where can I get more information? Learn more about preventing pregnancy from:  Centers for Disease Control and Prevention: LargeLists.ch  MediumTube.co.za: ElectronicsManager.it Summary  The only completely effective way to prevent pregnancy is to avoid having sex.  Preventing pregnancy depends on finding the birth control method that works best for you. No matter which type of birth control you choose, it should be used correctly every time you have sex.  Condoms, which can be used for birth control, can also protect against STIs. This information is not intended to replace advice given to you by your health care provider. Make sure you discuss any questions you have with your health care provider. Document Released: 02/19/2016 Document Revised: 02/19/2016 Document Reviewed: 02/19/2016 Elsevier Interactive Patient Education  2019 [employer].

## 2018-02-18 NOTE — Progress Notes (Signed)
Subjective:    Patient ID: Stephanie Patrick, female    DOB: 1985-10-15, 32 y.o.   MRN: [id number]  Here for counseling what OTC medications she can take if pregnant and pregnancy test.  Stopped augmentin and started tylenol, tums.  Having heartburn.  Last pregnancy heartburn, tender breasts and this feels exactly the same.  Had contraception failure 10 Dec during middle of her cycle was supposed to be ovulating that week and LMP  27 Jan 2018  Denied vaginal bleeding, some thick mucous noted  Still having congestion and rhinitis wants to know what she can continue taking that is safe in pregnancy also.  Patient reported augmentin/sudafed/phenylephrine/dayquil/nyquil/flonase and nasal saline had been helping decreased congestion and post nasal drip/sinus pressure     Review of Systems  Constitutional: Negative for activity change, appetite change, chills, diaphoresis, fatigue, fever and unexpected weight change.  HENT: Positive for congestion, postnasal drip, rhinorrhea, sinus pressure, sinus pain and sore throat. Negative for dental problem, drooling, ear discharge, ear pain, facial swelling, hearing loss, mouth sores, nosebleeds, sneezing, tinnitus, trouble swallowing and voice change.   Eyes: Negative for photophobia, pain, discharge, redness, itching and visual disturbance.  Respiratory: Negative for cough, choking, chest tightness, shortness of breath, wheezing and stridor.   Cardiovascular: Negative for chest pain, palpitations and leg swelling.  Gastrointestinal: Negative for abdominal distention, abdominal pain, blood in stool, constipation, diarrhea, nausea and vomiting.  Endocrine: Negative for cold intolerance and heat intolerance.  Genitourinary: Negative for difficulty urinating, dysuria and hematuria.  Musculoskeletal: Negative for arthralgias, back pain, gait problem, joint swelling, myalgias, neck pain and neck stiffness.  Skin: Negative for color change, pallor, rash and wound.   Allergic/Immunologic: Positive for environmental allergies. Negative for food allergies.  Neurological: Negative for dizziness, tremors, seizures, syncope, facial asymmetry, speech difficulty, weakness, light-headedness, numbness and headaches.  Hematological: Negative for adenopathy. Does not bruise/bleed easily.  Psychiatric/Behavioral: Negative for agitation, behavioral problems, confusion and sleep disturbance.       Objective:   Physical Exam Vitals signs and nursing note reviewed.  Constitutional:      General: She is not in acute distress.    Appearance: Normal appearance. She is well-developed. She is ill-appearing. She is not toxic-appearing or diaphoretic.  HENT:     Head: Normocephalic and atraumatic.     Jaw: There is normal jaw occlusion. No trismus.     Right Ear: Hearing, ear canal and external ear normal. No decreased hearing noted. A middle ear effusion is present.     Left Ear: Hearing, ear canal and external ear normal. No decreased hearing noted. A middle ear effusion is present.     Nose: Mucosal edema and rhinorrhea present. No nasal deformity, septal deviation or laceration.     Right Sinus: Maxillary sinus tenderness present. No frontal sinus tenderness.     Left Sinus: Maxillary sinus tenderness present. No frontal sinus tenderness.     Comments: Cobblestoning posterior pharynx; bilateral TMs air fluid level clear; bilateral allergic shiners; nasal turbintes edema erythema clear discharge, nasal congestion    Mouth/Throat:     Lips: Pink. No lesions.     Mouth: Mucous membranes are moist. Mucous membranes are not pale, not dry and not cyanotic. No lacerations, oral lesions or angioedema.     Dentition: Normal dentition. Does not have dentures. No dental caries or dental abscesses.     Tongue: No lesions.     Pharynx: Uvula midline. Posterior oropharyngeal erythema present. No oropharyngeal exudate or uvula swelling.  Tonsils: No tonsillar exudate or tonsillar  abscesses. Swelling: 0 on the right. 0 on the left.  Eyes:     General: Lids are normal. No scleral icterus.       Right eye: No foreign body, discharge or hordeolum.        Left eye: No foreign body, discharge or hordeolum.     Extraocular Movements: Extraocular movements intact.     Right eye: Normal extraocular motion and no nystagmus.     Left eye: Normal extraocular motion and no nystagmus.     Conjunctiva/sclera: Conjunctivae normal.     Right eye: Right conjunctiva is not injected. No chemosis, exudate or hemorrhage.    Left eye: Left conjunctiva is not injected. No chemosis, exudate or hemorrhage.    Pupils: Pupils are equal, round, and reactive to light. Pupils are equal.     Right eye: Pupil is round and reactive.     Left eye: Pupil is round and reactive.  Neck:     Musculoskeletal: Normal range of motion and neck supple. Normal range of motion. No edema, erythema, neck rigidity, spinous process tenderness or muscular tenderness.     Thyroid: No thyroid mass or thyromegaly.     Trachea: Trachea and phonation normal. No tracheal tenderness or tracheal deviation.  Cardiovascular:     Rate and Rhythm: Normal rate and regular rhythm.     Chest Wall: PMI is not displaced.     Pulses:          Radial pulses are 2+ on the right side and 2+ on the left side.     Heart sounds: Normal heart sounds, S1 normal and S2 normal. No murmur. No friction rub. No gallop.   Pulmonary:     Effort: Pulmonary effort is normal. No accessory muscle usage or respiratory distress.     Breath sounds: Normal breath sounds and air entry. No stridor, decreased air movement or transmitted upper airway sounds. No decreased breath sounds, wheezing, rhonchi or rales.     Comments: No cough observed in exam room; spoke full sentences without difficulty Chest:     Chest wall: No tenderness.  Abdominal:     General: There is no distension.     Palpations: Abdomen is soft. There is no fluid wave.     Tenderness:  There is no abdominal tenderness. There is no guarding.  Musculoskeletal: Normal range of motion.        General: No tenderness.     Right shoulder: Normal.     Left shoulder: Normal.     Right elbow: Normal.    Left elbow: Normal.     Right hip: Normal.     Left hip: Normal.     Right knee: Normal.     Left knee: Normal.     Cervical back: Normal.     Thoracic back: Normal.     Lumbar back: Normal.     Right hand: Normal.     Left hand: Normal.     Right lower leg: No edema.     Left lower leg: No edema.  Lymphadenopathy:     Head:     Right side of head: No submental, submandibular, tonsillar, preauricular, posterior auricular or occipital adenopathy.     Left side of head: No submental, submandibular, tonsillar, preauricular, posterior auricular or occipital adenopathy.     Cervical: No cervical adenopathy.     Right cervical: No superficial, deep or posterior cervical adenopathy.    Left cervical:  No superficial, deep or posterior cervical adenopathy.  Skin:    General: Skin is warm and dry.     Capillary Refill: Capillary refill takes less than 2 seconds.     Coloration: Skin is not ashen, cyanotic, jaundiced, mottled, pale or sallow.     Findings: No abrasion, bruising, burn, ecchymosis, erythema, laceration, lesion, petechiae or rash.     Nails: There is no clubbing.   Neurological:     General: No focal deficit present.     Mental Status: She is alert and oriented to person, place, and time. She is not disoriented.     GCS: GCS eye subscore is 4. GCS verbal subscore is 5. GCS motor subscore is 6.     Cranial Nerves: Cranial nerves are intact. No cranial nerve deficit, dysarthria or facial asymmetry.     Sensory: Sensation is intact. No sensory deficit.     Motor: Motor function is intact. No weakness, tremor, atrophy, abnormal muscle tone or seizure activity.     Coordination: Coordination normal.     Gait: Gait normal.     Comments: Gait sure and steady in hallway;  on/off exam table and in/out of chair without difficulty  Psychiatric:        Attention and Perception: Attention and perception normal.        Mood and Affect: Affect normal. Mood is anxious.        Speech: Speech normal.        Behavior: Behavior normal. Behavior is cooperative.        Thought Content: Thought content normal.        Cognition and Memory: Cognition and memory normal.        Judgment: Judgment normal.           Assessment & Plan:  A-encounter for pregnancy test, acute maxillary sinusitis  P-contraception failure last week.  Predicted ovulatory week.  Pregnancy serum test today.  May continue tums per manufacturer's instructions po prn heartburn.  exitcare handout on medications safe in pregnancy, contraception.  Discussed may continue tylenol, tums, flonase and saline Discussed pregnancy risk  Low but possible teratogenicity with augmentin, phenylephrine, pseudoephedrine/motrin, aspirin  Stop dayquil and nyquil Pregnancy test today and if negative repeat in 2 weeks if still no menses would be 1 week past expected menstruation start date this month. LMP 26 Jan 2018 had contraception failure 10 Dec;  Due to start menses next Friday 27 Dec  Patient verbalized understanding information/instructions, agreed with plan of care and had no further questions at this time.  Patient will follow up with RN Rolly Salter tomorrow for lab results.  Tylenol 1000mg  po QID prn pain/fever.   No evidence of invasive bacterial infection, non toxic and well hydrated.  This is most likely self limiting viral infection.  I do not see where any further testing or imaging is necessary at this time.   I will suggest supportive care, rest, good hygiene and encourage the patient to take adequate fluids.  The patient is to return to clinic or EMERGENCY ROOM if symptoms worsen or change significantly e.g. ear pain, fever, purulent discharge from ears or bleeding.  Patient verbalized agreement and understanding of  treatment plan.      Tylenol 1000mg  po QID prn pain.  Cough lozenges po q2h prn cough.  Discussed humidifier/air filtration unit and.discussed nasal saline initiation in the shower using light squeeze to dispense mist versus gush of saline and sniff up when exposed to dust..  Stop phenylephrine  10mg  po q6h prn rhinitis/pnd at home . Consider OTC antihistamine claritin 10mg  po daily.   saline 2 sprays each nostril q2h wa prn congestion given 1 bottle from clinic stock.  Consider starting flonase 1 spray each nostril BID OTC.   Denied personal or family history of ENT cancer.  Shower BID especially prior to bed. No evidence of systemic bacterial infection, non toxic and well hydrated.  I do not see where any further testing or imaging is necessary at this time.   I will suggest supportive care, rest, good hygiene and encourage the patient to take adequate fluids.  The patient is to return to clinic or EMERGENCY ROOM if symptoms worsen or change significantly.  Patient verbalized agreement and understanding of treatment plan and had no further questions at this time.   P2:  Hand washing and cover cough

## 2018-02-19 LAB — BETA HCG QUANT (REF LAB): hCG Quant: <1 m[IU]/mL

## 2018-02-19 NOTE — Progress Notes (Signed)
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 Advised pt of NP recommendations per chart review to retest in 2 weeks if no menstruation before that and to follow up with any questions. No response back from pt by end of day.

## 2018-02-19 NOTE — Progress Notes (Signed)
Results reviewed with pt in clinic 02/18/18. LDL elevated. No prior records available to compare to. All other labs unremarkable. Diet and exercise recommendations discussed re: LDL, wt management. Routine f/u with pcp. Copy of labs provided to pt. Results routed to pcp per pt request. No further questions/concerns.

## 2018-03-03 DIAGNOSIS — A4150 Gram-negative sepsis, unspecified: Secondary | ICD-10-CM

## 2018-03-03 DIAGNOSIS — A4151 Sepsis due to Escherichia coli [E. coli]: Secondary | ICD-10-CM

## 2018-03-03 DIAGNOSIS — N10 Acute tubulo-interstitial nephritis: Secondary | ICD-10-CM

## 2018-03-03 DIAGNOSIS — I429 Cardiomyopathy, unspecified: Secondary | ICD-10-CM

## 2018-03-03 DIAGNOSIS — R6521 Severe sepsis with septic shock: Secondary | ICD-10-CM

## 2018-03-03 HISTORY — DX: Severe sepsis with septic shock: R65.21

## 2018-03-03 HISTORY — DX: Gram-negative sepsis, unspecified: A41.50

## 2018-03-03 HISTORY — DX: Cardiomyopathy, unspecified: I42.9

## 2018-03-03 HISTORY — DX: Acute pyelonephritis: N10

## 2018-03-03 HISTORY — DX: Sepsis due to Escherichia coli (e. coli): A41.51

## 2018-03-09 ENCOUNTER — Encounter: Payer: Self-pay | Admitting: Registered Nurse

## 2018-03-09 ENCOUNTER — Ambulatory Visit: Payer: Self-pay | Admitting: Registered Nurse

## 2018-03-09 VITALS — BP 117/80 | HR 75 | Temp 98.9°F

## 2018-03-09 DIAGNOSIS — H6983 Other specified disorders of Eustachian tube, bilateral: Secondary | ICD-10-CM

## 2018-03-09 DIAGNOSIS — B349 Viral infection, unspecified: Secondary | ICD-10-CM

## 2018-03-09 MED ORDER — FLUTICASONE PROPIONATE 50 MCG/ACT NA SUSP: 1.0000 | Freq: Two times a day (BID) | NASAL | 0 refills | Status: DC

## 2018-03-09 NOTE — Patient Instructions (Addendum)
How to Perform a Sinus Rinse A sinus rinse is a home treatment that is used to rinse your sinuses with a sterile mixture of salt and water (saline solution). Sinuses are air-filled spaces in your skull behind the bones of your face and forehead that open into your nasal cavity. A sinus rinse can help to clear mucus, dirt, dust, or pollen from your nasal cavity. You may do a sinus rinse when you have a cold, a virus, nasal allergy symptoms, a sinus infection, or stuffiness in your nose or sinuses. Talk with your health care provider about whether a sinus rinse might help you. What are the risks? A sinus rinse is generally safe and effective. However, there are a few risks, which include:  A burning sensation in your sinuses. This may happen if you do not make the saline solution as directed. Be sure to follow all directions when making the saline solution.  Nasal irritation.  Infection from contaminated water. This is rare, but possible. Do not do a sinus rinse if you have had ear or nasal surgery, ear infection, or blocked ears. Supplies needed:  Saline solution or powder.  Distilled or sterile water may be needed to mix with saline powder. ? You may use boiled and cooled tap water. Boil tap water for 5 minutes; cool until it is lukewarm. Use within 24 hours. ? Do not use regular tap water to mix with the saline solution.  Neti pot or nasal rinse bottle. These supplies release the saline solution into your nose and through your sinuses. Neti pots and nasal rinse bottles can be purchased at [facility] local [facility], a health food store, or online. How to perform a sinus rinse  1. Wash your hands with soap and water. 2. Wash your device according to the directions that came with the product and then dry it. 3. Use the solution that comes with your product or one that is sold separately in stores. Follow the mixing directions on the package if you need to mix with sterile or distilled  water. 4. Fill the device with the amount of saline solution noted in the device instructions. 5. Stand over a sink and tilt your head sideways over the sink. 6. Place the spout of the device in your upper nostril (the one closer to the ceiling). 7. Gently pour or squeeze the saline solution into your nasal cavity. The liquid should drain out from the lower nostril if you are not too congested. 8. While rinsing, breathe through your open mouth. 9. Gently blow your nose to clear any mucus and rinse solution. Blowing too hard may cause ear pain. 10. Repeat in your other nostril. 11. Clean and rinse your device with clean water and then air-dry it. Talk with your health care provider or pharmacist if you have questions about how to do a sinus rinse. Summary  A sinus rinse is a home treatment that is used to rinse your sinuses with a sterile mixture of salt and water (saline solution).  A sinus rinse is generally safe and effective. Follow all instructions carefully.  Before doing a sinus rinse, talk with your health care provider about whether it would be helpful for you. This information is not intended to replace advice given to you by your health care provider. Make sure you discuss any questions you have with your health care provider. Document Released: 09/14/2013 Document Revised: 12/15/2016 Document Reviewed: 12/15/2016 Elsevier Interactive Patient Education  2019 [employer] Eustachian Tube Dysfunction  Eustachian  tube dysfunction refers to a condition in which a blockage develops in the narrow passage that connects the middle ear to the back of the nose (eustachian tube). The eustachian tube regulates air pressure in the middle ear by letting air move between the ear and nose. It also helps to drain fluid from the middle ear space. Eustachian tube dysfunction can affect one or both ears. When the eustachian tube does not function properly, air pressure, fluid, or both can build up in  the middle ear. What are the causes? This condition occurs when the eustachian tube becomes blocked or cannot open normally. Common causes of this condition include:  Ear infections.  Colds and other infections that affect the nose, mouth, and throat (upper respiratory tract).  Allergies.  Irritation from cigarette smoke.  Irritation from stomach acid coming up into the esophagus (gastroesophageal reflux). The esophagus is the tube that carries food from the mouth to the stomach.  Sudden changes in air pressure, such as from descending in an airplane or scuba diving.  Abnormal growths in the nose or throat, such as: ? Growths that line the nose (nasal polyps). ? Abnormal growth of cells (tumors). ? Enlarged tissue at the back of the throat (adenoids). What increases the risk? You are more likely to develop this condition if:  You smoke.  You are overweight.  You are a child who has: ? Certain birth defects of the mouth, such as cleft palate. ? Large tonsils or adenoids. What are the signs or symptoms? Common symptoms of this condition include:  A feeling of fullness in the ear.  Ear pain.  Clicking or popping noises in the ear.  Ringing in the ear.  Hearing loss.  Loss of balance.  Dizziness. Symptoms may get worse when the air pressure around you changes, such as when you travel to an area of high elevation, fly on an airplane, or go scuba diving. How is this diagnosed? This condition may be diagnosed based on:  Your symptoms.  A physical exam of your ears, nose, and throat.  Tests, such as those that measure: ? The movement of your eardrum (tympanogram). ? Your hearing (audiometry). How is this treated? Treatment depends on the cause and severity of your condition.  In mild cases, you may relieve your symptoms by moving air into your ears. This is called "popping the ears."  In more severe cases, or if you have symptoms of fluid in your ears, treatment  may include: ? Medicines to relieve congestion (decongestants). ? Medicines that treat allergies (antihistamines). ? Nasal sprays or ear drops that contain medicines that reduce swelling (steroids). ? A procedure to drain the fluid in your eardrum (myringotomy). In this procedure, a small tube is placed in the eardrum to:  Drain the fluid.  Restore the air in the middle ear space. ? A procedure to insert a balloon device through the nose to inflate the opening of the eustachian tube (balloon dilation). Follow these instructions at home: Lifestyle  Do not do any of the following until your health care provider approves: ? Travel to high altitudes. ? Fly in airplanes. ? Work in a Estate agentpressurized cabin or room. ? Scuba dive.  Do not use any products that contain nicotine or tobacco, such as cigarettes and e-cigarettes. If you need help quitting, ask your health care provider.  Keep your ears dry. Wear fitted earplugs during showering and bathing. Dry your ears completely after. General instructions  Take over-the-counter and prescription medicines only  as told by your health care provider.  Use techniques to help pop your ears as recommended by your health care provider. These may include: ? Chewing gum. ? Yawning. ? Frequent, forceful swallowing. ? Closing your mouth, holding your nose closed, and gently blowing as if you are trying to blow air out of your nose.  Keep all follow-up visits as told by your health care provider. This is important. Contact a health care provider if:  Your symptoms do not go away after treatment.  Your symptoms come back after treatment.  You are unable to pop your ears.  You have: ? A fever. ? Pain in your ear. ? Pain in your head or neck. ? Fluid draining from your ear.  Your hearing suddenly changes.  You become very dizzy.  You lose your balance. Summary  Eustachian tube dysfunction refers to a condition in which a blockage develops in  the eustachian tube.  It can be caused by ear infections, allergies, inhaled irritants, or abnormal growths in the nose or throat.  Symptoms include ear pain, hearing loss, or ringing in the ears.  Mild cases are treated with maneuvers to unblock the ears, such as yawning or ear popping.  Severe cases are treated with medicines. Surgery may also be done (rare). This information is not intended to replace advice given to you by your health care provider. Make sure you discuss any questions you have with your health care provider. Document Released: 03/16/2015 Document Revised: 06/09/2017 Document Reviewed: 06/09/2017 Elsevier Interactive Patient Education  2019 [employer]. Viral Illness, Adult Viruses are tiny germs that can get into a person's body and cause illness. There are many different types of viruses, and they cause many types of illness. Viral illnesses can range from mild to severe. They can affect various parts of the body. Common illnesses that are caused by a virus include colds and the flu. Viral illnesses also include serious conditions such as HIV/AIDS (human immunodeficiency virus/acquired immunodeficiency syndrome). A few viruses have been linked to certain cancers. What are the causes? Many types of viruses can cause illness. Viruses invade cells in your body, multiply, and cause the infected cells to malfunction or die. When the cell dies, it releases more of the virus. When this happens, you develop symptoms of the illness, and the virus continues to spread to other cells. If the virus takes over the function of the cell, it can cause the cell to divide and grow out of control, as is the case when a virus causes cancer. Different viruses get into the body in different ways. You can get a virus by:  Swallowing food or water that is contaminated with the virus.  Breathing in droplets that have been coughed or sneezed into the air by an infected person.  Touching a surface  that has been contaminated with the virus and then touching your eyes, nose, or mouth.  Being bitten by an insect or animal that carries the virus.  Having sexual contact with a person who is infected with the virus.  Being exposed to blood or fluids that contain the virus, either through an open cut or during a transfusion. If a virus enters your body, your body's defense system (immune system) will try to fight the virus. You may be at higher risk for a viral illness if your immune system is weak. What are the signs or symptoms? Symptoms vary depending on the type of virus and the location of the cells that it  invades. Common symptoms of the main types of viral illnesses include: Cold and flu viruses  Fever.  Headache.  Sore throat.  Muscle aches.  Nasal congestion.  Cough. Digestive system (gastrointestinal) viruses  Fever.  Abdominal pain.  Nausea.  Diarrhea. Liver viruses (hepatitis)  Loss of appetite.  Tiredness.  Yellowing of the skin (jaundice). Brain and spinal cord viruses  Fever.  Headache.  Stiff neck.  Nausea and vomiting.  Confusion or sleepiness. Skin viruses  Warts.  Itching.  Rash. Sexually transmitted viruses  Discharge.  Swelling.  Redness.  Rash. How is this treated? Viruses can be difficult to treat because they live within cells. Antibiotic medicines do not treat viruses because these drugs do not get inside cells. Treatment for a viral illness may include:  Resting and drinking plenty of fluids.  Medicines to relieve symptoms. These can include over-the-counter medicine for pain and fever, medicines for cough or congestion, and medicines to relieve diarrhea.  Antiviral medicines. These drugs are available only for certain types of viruses. They may help reduce flu symptoms if taken early. There are also many antiviral medicines for hepatitis and HIV/AIDS. Some viral illnesses can be prevented with vaccinations. A common  example is the flu shot. Follow these instructions at home: Medicines   Take over-the-counter and prescription medicines only as told by your health care provider.  If you were prescribed an antiviral medicine, take it as told by your health care provider. Do not stop taking the medicine even if you start to feel better.  Be aware of when antibiotics are needed and when they are not needed. Antibiotics do not treat viruses. If your health care provider thinks that you may have a bacterial infection as well as a viral infection, you may get an antibiotic. ? Do not ask for an antibiotic prescription if you have been diagnosed with a viral illness. That will not make your illness go away faster. ? Frequently taking antibiotics when they are not needed can lead to antibiotic resistance. When this develops, the medicine no longer works against the bacteria that it normally fights. General instructions  Drink enough fluids to keep your urine clear or pale yellow.  Rest as much as possible.  Return to your normal activities as told by your health care provider. Ask your health care provider what activities are safe for you.  Keep all follow-up visits as told by your health care provider. This is important. How is this prevented? Take these actions to reduce your risk of viral infection:  Eat a healthy diet and get enough rest.  Wash your hands often with soap and water. This is especially important when you are in public places. If soap and water are not available, use hand sanitizer.  Avoid close contact with friends and family who have a viral illness.  If you travel to areas where viral gastrointestinal infection is common, avoid drinking water or eating raw food.  Keep your immunizations up to date. Get a flu shot every year as told by your health care provider.  Do not share toothbrushes, nail clippers, razors, or needles with other people.  Always practice safe sex.  Contact a  health care provider if:  You have symptoms of a viral illness that do not go away.  Your symptoms come back after going away.  Your symptoms get worse. Get help right away if:  You have trouble breathing.  You have a severe headache or a stiff neck.  You have severe  vomiting or abdominal pain. This information is not intended to replace advice given to you by your health care provider. Make sure you discuss any questions you have with your health care provider. Document Released: 06/29/2015 Document Revised: 08/01/2015 Document Reviewed: 06/29/2015 Elsevier Interactive Patient Education  2019 [employer].

## 2018-03-09 NOTE — Progress Notes (Signed)
Subjective:    Patient ID: Stephanie Patrick, female    DOB: 1985-11-11, 33 y.o.   MRN: [id number]  32y/o established female pt c/o bilateral ear pain and pressure x2 days, no drainage. Also with sore throat and PND, hoarse voice. Only Ibuprofen at home. No other OTCs.  Menstrual cycle did start as planned but found out partner had other partners saw GYN for STI testing and home pregnancy test negative prior to start of menstruation. Has flonase but not using and needs another bottle nasal saline stopped using phenylephrine.  Finished all of her amoxicillin and it didn't seem to help sore throat + sick contacts at work viral URI/gastroenteritis     Review of Systems  Constitutional: Negative for activity change, appetite change, chills, diaphoresis, fatigue, fever and unexpected weight change.  HENT: Positive for congestion, ear pain, postnasal drip, rhinorrhea, sore throat and voice change. Negative for dental problem, drooling, ear discharge, facial swelling, hearing loss, mouth sores, nosebleeds, sinus pressure, sinus pain, sneezing, tinnitus and trouble swallowing.   Eyes: Negative for photophobia, pain, discharge, redness, itching and visual disturbance.  Respiratory: Negative for cough, choking, chest tightness, shortness of breath, wheezing and stridor.   Cardiovascular: Negative for chest pain, palpitations and leg swelling.  Gastrointestinal: Negative for abdominal distention, abdominal pain, diarrhea, nausea and vomiting.  Endocrine: Negative for cold intolerance and heat intolerance.  Genitourinary: Negative for difficulty urinating, dysuria and hematuria.  Musculoskeletal: Negative for arthralgias, back pain, gait problem, joint swelling, myalgias, neck pain and neck stiffness.  Skin: Negative for color change, pallor, rash and wound.  Allergic/Immunologic: Negative for environmental allergies and food allergies.  Neurological: Negative for dizziness, tremors, seizures, syncope,  facial asymmetry, speech difficulty, weakness, light-headedness, numbness and headaches.  Hematological: Negative for adenopathy. Does not bruise/bleed easily.  Psychiatric/Behavioral: Positive for sleep disturbance. Negative for agitation, behavioral problems and confusion.       Objective:   Physical Exam Vitals signs and nursing note reviewed.  Constitutional:      General: She is not in acute distress.    Appearance: She is well-developed and well-groomed. She is not ill-appearing, toxic-appearing or diaphoretic.  HENT:     Head: Normocephalic and atraumatic.     Jaw: There is normal jaw occlusion. No trismus.     Salivary Glands: Right salivary gland is not diffusely enlarged or tender. Left salivary gland is not diffusely enlarged or tender.     Right Ear: Hearing, ear canal and external ear normal. A middle ear effusion is present.     Left Ear: Hearing, ear canal and external ear normal. A middle ear effusion is present.     Nose: Mucosal edema, congestion and rhinorrhea present. No nasal deformity, septal deviation, signs of injury or laceration.     Right Nostril: No epistaxis or occlusion.     Left Nostril: No epistaxis or occlusion.     Right Turbinates: Enlarged and swollen. Not pale.     Left Turbinates: Enlarged and swollen. Not pale.     Right Sinus: No maxillary sinus tenderness or frontal sinus tenderness.     Left Sinus: No maxillary sinus tenderness or frontal sinus tenderness.     Mouth/Throat:     Lips: Pink. No lesions.     Mouth: Mucous membranes are moist. Mucous membranes are not pale, not dry and not cyanotic. No injury, lacerations, oral lesions or angioedema.     Dentition: Normal dentition. Does not have dentures. No dental caries or dental abscesses.  Tongue: No lesions.     Pharynx: Uvula midline. Pharyngeal swelling and posterior oropharyngeal erythema present. No oropharyngeal exudate or uvula swelling.     Tonsils: No tonsillar exudate or tonsillar  abscesses. Swelling: 0 on the right. 0 on the left.  Eyes:     General: Lids are normal. No scleral icterus.       Right eye: No foreign body, discharge or hordeolum.        Left eye: No foreign body, discharge or hordeolum.     Extraocular Movements:     Right eye: Normal extraocular motion and no nystagmus.     Left eye: Normal extraocular motion and no nystagmus.     Conjunctiva/sclera: Conjunctivae normal.     Right eye: Right conjunctiva is not injected. No chemosis, exudate or hemorrhage.    Left eye: Left conjunctiva is not injected. No chemosis, exudate or hemorrhage.    Pupils: Pupils are equal, round, and reactive to light. Pupils are equal.     Right eye: Pupil is round and reactive.     Left eye: Pupil is round and reactive.  Neck:     Musculoskeletal: Normal range of motion and neck supple. Normal range of motion. No edema, erythema, neck rigidity, injury, pain with movement, torticollis, spinous process tenderness or muscular tenderness.     Thyroid: No thyroid mass or thyromegaly.     Trachea: Trachea normal. No tracheal tenderness or tracheal deviation.  Cardiovascular:     Rate and Rhythm: Normal rate and regular rhythm.     Chest Wall: PMI is not displaced.     Heart sounds: Normal heart sounds, S1 normal and S2 normal. No murmur. No friction rub. No gallop.   Pulmonary:     Effort: Pulmonary effort is normal. No accessory muscle usage or respiratory distress.     Breath sounds: Normal breath sounds and air entry. No stridor, decreased air movement or transmitted upper airway sounds. No decreased breath sounds, wheezing, rhonchi or rales.     Comments: No cough observed in exam room; spoke full sentences without difficulty Chest:     Chest wall: No tenderness.  Abdominal:     General: Abdomen is flat. Bowel sounds are decreased. There is no distension.     Palpations: Abdomen is soft. There is no shifting dullness, fluid wave, hepatomegaly, splenomegaly, mass or  pulsatile mass.     Tenderness: There is no abdominal tenderness. There is no right CVA tenderness, left CVA tenderness, guarding or rebound. Negative signs include Murphy's sign.     Hernia: No hernia is present.     Comments: Dull to percussion x 4 quads; hypoactive bowel sounds x 4 quads; sitting to supine and reverse quickly  Musculoskeletal: Normal range of motion.        General: No tenderness.     Right shoulder: Normal.     Left shoulder: Normal.     Right elbow: Normal.    Left elbow: Normal.     Right hip: Normal.     Left hip: Normal.     Right knee: Normal.     Left knee: Normal.     Cervical back: Normal.     Thoracic back: Normal.     Lumbar back: Normal.     Right hand: Normal.     Left hand: Normal.  Lymphadenopathy:     Head:     Right side of head: No submental, submandibular, tonsillar, preauricular, posterior auricular or occipital adenopathy.  Left side of head: No submental, submandibular, tonsillar, preauricular, posterior auricular or occipital adenopathy.     Cervical: No cervical adenopathy.     Right cervical: No superficial, deep or posterior cervical adenopathy.    Left cervical: No superficial, deep or posterior cervical adenopathy.  Skin:    General: Skin is warm and dry.     Capillary Refill: Capillary refill takes less than 2 seconds.     Coloration: Skin is not ashen, cyanotic, jaundiced, mottled, pale or sallow.     Findings: No abrasion, bruising, burn, ecchymosis, erythema, laceration, lesion, petechiae or rash.     Nails: There is no clubbing.   Neurological:     General: No focal deficit present.     Mental Status: She is alert and oriented to person, place, and time. Mental status is at baseline. She is not disoriented.     GCS: GCS eye subscore is 4. GCS verbal subscore is 5. GCS motor subscore is 6.     Cranial Nerves: Cranial nerves are intact. No cranial nerve deficit, dysarthria or facial asymmetry.     Sensory: Sensation is  intact. No sensory deficit.     Motor: Motor function is intact. No weakness, tremor, atrophy, abnormal muscle tone or seizure activity.     Coordination: Coordination is intact. Coordination normal.     Gait: Gait is intact. Gait normal.     Comments: In/out of chair and on/off exam table without difficulty; gait sure and steady in hallway  Psychiatric:        Attention and Perception: Attention and perception normal.        Mood and Affect: Affect is tearful.        Speech: Speech normal.        Behavior: Behavior normal. Behavior is cooperative.        Thought Content: Thought content normal.        Cognition and Memory: Cognition and memory normal.        Judgment: Judgment normal.     Comments: Patient became tearful when I asked about her menstruation status and discussing she found out sexual partner cheated on her and she went for STI testing since last visit with me to her GYN           Assessment & Plan:  A-eustachian tube dysfunction bilaterally; viral illness  P-Supportive treatment.   No evidence of invasive bacterial infection, non toxic and well hydrated.  This is most likely self limiting viral infection.  I do not see where any further testing or imaging is necessary at this time.   I will suggest supportive care, rest, good hygiene and encourage the patient to take adequate fluids.  The patient is to return to clinic or EMERGENCY ROOM if symptoms worsen or change significantly e.g. ear pain, fever, purulent discharge from ears or bleeding.  Exitcare handout on eustachian tube dysfunction  Patient verbalized agreement and understanding of treatment plan.   Discussed with patient viral illness common in community/worksite.  Restart flonase 1 spray each nostril BID, continue nasal saline 2 sprays each nostril q2h wa prn congestion given 1 bottle from clinic stock.  Phenylephrine 10mg  po q6h prn rhinitis given 4 UD from clinic stock.  May use motrin 800mg  po TId prn pain or  tylenol 1000mg  po QID prn pain/fever.  Tylenol generally irritates GI tract less than motrin.  Ginger ale can help soothe upset stomach.  Avoid large portions meat/dairy/spicy/fried while stomach upset as harder to digest  or can further irritate digestive tract.  Hydrate with water or gatorade/powerade/broths.  Follow up scheduled 2pm 11 Mar 2018 for re-evaluation right ear as possible start of erythema noted.  Will take 30 days or more for middle ear effusion to resolve.  Exitcare handout viral illness.  Patient verbalized understanding information/instructions, agreed with plan of care and had no further questions at this time.

## 2018-03-11 ENCOUNTER — Encounter: Payer: Self-pay | Admitting: Registered Nurse

## 2018-03-11 ENCOUNTER — Telehealth: Payer: Self-pay | Admitting: Registered Nurse

## 2018-03-11 MED ORDER — PHENYLEPHRINE HCL 5 MG PO TABS: 5.0000 mg | ORAL_TABLET | Freq: Four times a day (QID) | ORAL | 0 refills | Status: DC | PRN

## 2018-03-11 MED ORDER — PREDNISONE 10 MG (21) PO TBPK: ORAL_TABLET | ORAL | 0 refills | Status: DC

## 2018-03-11 NOTE — Telephone Encounter (Addendum)
Ambulatory to [facility]  T99F otic spo2 98% RA 16RR Sore throat and cough; ears still bothering her. Here for recheck,  A&Ox3 NAD PEARL ears rechecked no erythema TMs air fluid level clear persists, maxillary sinuses TTP, mild cobblestoning posterior pharynx, no exudate mucous membranes moist clear discharge bilateral nasal turbinates edema erythema bilateral allergic shiners, frequent nasal clearing, no cough observed spoke full sentences without difficulty; no cervical/auricular/occipital/submental lymphadenopathy, skin warm dry and pink, gait sure and steady hallway on/off exam table and in/out of chair without difficulty;  will start prednisone 10mg  po taper x 6 days dispensed #21 RF0 from [facility] to patient continue flonase, saline, phenylephrine use per previous instructions.  Patient returned to clinic and notified me after prednisone dispense she restarted adderall 30mg  po qam today and had not disclosed to me.  She will take prednisone 30mg  po qamx3 days then 20mg  po x 2days then 10mg  po x 2 days instead of 60/50/40/30/20/10mg  po taper.  She will follow up for re-evaluation if fever greater than 100.5, body aches, worsening headache despite plan of care.  Discussed again this virus typically lasting 2 weeks and to avoid dehydration hydrate with water to keep urine clear yellow tinged.  Patient to increase use of nasal saline typically only using at home in the evening.  Patient verbalized understanding information/instructions, agreed with plan of care and had no further questions at this time.Marland Kitchen

## 2018-03-18 ENCOUNTER — Encounter (HOSPITAL_COMMUNITY): Payer: Self-pay | Admitting: Emergency Medicine

## 2018-03-18 ENCOUNTER — Encounter: Payer: Self-pay | Admitting: Registered Nurse

## 2018-03-18 ENCOUNTER — Other Ambulatory Visit: Payer: Self-pay

## 2018-03-18 ENCOUNTER — Ambulatory Visit: Payer: Self-pay | Admitting: Registered Nurse

## 2018-03-18 ENCOUNTER — Encounter (HOSPITAL_COMMUNITY)
Admission: EM | Disposition: A | Discharge status: Home / Self Care | Payer: Self-pay | Source: Home / Self Care | Attending: Internal Medicine

## 2018-03-18 ENCOUNTER — Emergency Department (HOSPITAL_COMMUNITY): Payer: Self-pay

## 2018-03-18 ENCOUNTER — Inpatient Hospital Stay (HOSPITAL_COMMUNITY)
Admission: EM | Admit: 2018-03-18 | Discharge: 2018-03-27 | DRG: 853 | Disposition: A | Discharge status: Home / Self Care | Payer: Self-pay | Attending: Internal Medicine | Admitting: Internal Medicine

## 2018-03-18 ENCOUNTER — Inpatient Hospital Stay (HOSPITAL_COMMUNITY): Payer: Self-pay | Admitting: Registered Nurse

## 2018-03-18 VITALS — BP 127/86 | HR 75 | Temp 99.5°F

## 2018-03-18 DIAGNOSIS — I428 Other cardiomyopathies: Secondary | ICD-10-CM | POA: Diagnosis not present

## 2018-03-18 DIAGNOSIS — A4151 Sepsis due to Escherichia coli [E. coli]: Principal | ICD-10-CM | POA: Diagnosis present

## 2018-03-18 DIAGNOSIS — Z789 Other specified health status: Secondary | ICD-10-CM

## 2018-03-18 DIAGNOSIS — Z6833 Body mass index (BMI) 33.0-33.9, adult: Secondary | ICD-10-CM

## 2018-03-18 DIAGNOSIS — N136 Pyonephrosis: Secondary | ICD-10-CM | POA: Diagnosis present

## 2018-03-18 DIAGNOSIS — Z4659 Encounter for fitting and adjustment of other gastrointestinal appliance and device: Secondary | ICD-10-CM

## 2018-03-18 DIAGNOSIS — R339 Retention of urine, unspecified: Secondary | ICD-10-CM | POA: Diagnosis present

## 2018-03-18 DIAGNOSIS — N390 Urinary tract infection, site not specified: Secondary | ICD-10-CM | POA: Diagnosis present

## 2018-03-18 DIAGNOSIS — Z87442 Personal history of urinary calculi: Secondary | ICD-10-CM

## 2018-03-18 DIAGNOSIS — R6521 Severe sepsis with septic shock: Secondary | ICD-10-CM | POA: Diagnosis present

## 2018-03-18 DIAGNOSIS — Z833 Family history of diabetes mellitus: Secondary | ICD-10-CM

## 2018-03-18 DIAGNOSIS — J811 Chronic pulmonary edema: Secondary | ICD-10-CM

## 2018-03-18 DIAGNOSIS — Z87440 Personal history of urinary (tract) infections: Secondary | ICD-10-CM

## 2018-03-18 DIAGNOSIS — Z79899 Other long term (current) drug therapy: Secondary | ICD-10-CM

## 2018-03-18 DIAGNOSIS — R109 Unspecified abdominal pain: Secondary | ICD-10-CM

## 2018-03-18 DIAGNOSIS — B001 Herpesviral vesicular dermatitis: Secondary | ICD-10-CM | POA: Diagnosis not present

## 2018-03-18 DIAGNOSIS — F329 Major depressive disorder, single episode, unspecified: Secondary | ICD-10-CM | POA: Diagnosis present

## 2018-03-18 DIAGNOSIS — J9690 Respiratory failure, unspecified, unspecified whether with hypoxia or hypercapnia: Secondary | ICD-10-CM

## 2018-03-18 DIAGNOSIS — E669 Obesity, unspecified: Secondary | ICD-10-CM | POA: Diagnosis present

## 2018-03-18 DIAGNOSIS — I5021 Acute systolic (congestive) heart failure: Secondary | ICD-10-CM | POA: Diagnosis not present

## 2018-03-18 DIAGNOSIS — N200 Calculus of kidney: Secondary | ICD-10-CM

## 2018-03-18 DIAGNOSIS — J9601 Acute respiratory failure with hypoxia: Secondary | ICD-10-CM | POA: Diagnosis not present

## 2018-03-18 DIAGNOSIS — A419 Sepsis, unspecified organism: Secondary | ICD-10-CM

## 2018-03-18 DIAGNOSIS — T502X5A Adverse effect of carbonic-anhydrase inhibitors, benzothiadiazides and other diuretics, initial encounter: Secondary | ICD-10-CM | POA: Diagnosis not present

## 2018-03-18 DIAGNOSIS — F909 Attention-deficit hyperactivity disorder, unspecified type: Secondary | ICD-10-CM | POA: Diagnosis present

## 2018-03-18 DIAGNOSIS — Z885 Allergy status to narcotic agent status: Secondary | ICD-10-CM

## 2018-03-18 DIAGNOSIS — G4701 Insomnia due to medical condition: Secondary | ICD-10-CM | POA: Diagnosis not present

## 2018-03-18 DIAGNOSIS — N898 Other specified noninflammatory disorders of vagina: Secondary | ICD-10-CM | POA: Diagnosis present

## 2018-03-18 DIAGNOSIS — Z7951 Long term (current) use of inhaled steroids: Secondary | ICD-10-CM

## 2018-03-18 DIAGNOSIS — K5900 Constipation, unspecified: Secondary | ICD-10-CM | POA: Diagnosis not present

## 2018-03-18 DIAGNOSIS — E876 Hypokalemia: Secondary | ICD-10-CM | POA: Diagnosis not present

## 2018-03-18 DIAGNOSIS — Z8249 Family history of ischemic heart disease and other diseases of the circulatory system: Secondary | ICD-10-CM

## 2018-03-18 DIAGNOSIS — D6959 Other secondary thrombocytopenia: Secondary | ICD-10-CM | POA: Diagnosis present

## 2018-03-18 DIAGNOSIS — J9602 Acute respiratory failure with hypercapnia: Secondary | ICD-10-CM

## 2018-03-18 DIAGNOSIS — R579 Shock, unspecified: Secondary | ICD-10-CM

## 2018-03-18 DIAGNOSIS — A4150 Gram-negative sepsis, unspecified: Secondary | ICD-10-CM | POA: Diagnosis present

## 2018-03-18 DIAGNOSIS — N179 Acute kidney failure, unspecified: Secondary | ICD-10-CM | POA: Diagnosis present

## 2018-03-18 DIAGNOSIS — Z803 Family history of malignant neoplasm of breast: Secondary | ICD-10-CM

## 2018-03-18 DIAGNOSIS — E874 Mixed disorder of acid-base balance: Secondary | ICD-10-CM | POA: Diagnosis not present

## 2018-03-18 DIAGNOSIS — N201 Calculus of ureter: Secondary | ICD-10-CM | POA: Diagnosis present

## 2018-03-18 DIAGNOSIS — R112 Nausea with vomiting, unspecified: Secondary | ICD-10-CM

## 2018-03-18 DIAGNOSIS — Z95828 Presence of other vascular implants and grafts: Secondary | ICD-10-CM

## 2018-03-18 DIAGNOSIS — I340 Nonrheumatic mitral (valve) insufficiency: Secondary | ICD-10-CM | POA: Diagnosis present

## 2018-03-18 DIAGNOSIS — F419 Anxiety disorder, unspecified: Secondary | ICD-10-CM | POA: Diagnosis present

## 2018-03-18 HISTORY — DX: Personal history of urinary calculi: Z87.442

## 2018-03-18 HISTORY — PX: CYSTOSCOPY W/ URETERAL STENT PLACEMENT: SHX1429

## 2018-03-18 LAB — BASIC METABOLIC PANEL
Anion gap: 11 (ref 5–15)
BUN: 14 mg/dL (ref 6–20)
CALCIUM: 9.3 mg/dL (ref 8.9–10.3)
CO2: 21 mmol/L — ABNORMAL LOW (ref 22–32)
Chloride: 103 mmol/L (ref 98–111)
Creatinine, Ser: 0.93 mg/dL (ref 0.44–1.00)
GFR calc Af Amer: >60 mL/min (ref >60)
GFR calc non Af Amer: >60 mL/min (ref >60)
Glucose, Bld: 85 mg/dL (ref 70–99)
Potassium: 4.1 mmol/L (ref 3.5–5.1)
Sodium: 135 mmol/L (ref 135–145)

## 2018-03-18 LAB — INFLUENZA PANEL BY PCR (TYPE A & B)
Influenza A By PCR: NEGATIVE
Influenza B By PCR: NEGATIVE

## 2018-03-18 LAB — URINALYSIS, ROUTINE W REFLEX MICROSCOPIC
Bilirubin Urine: NEGATIVE
GLUCOSE, UA: NEGATIVE mg/dL
Ketones, ur: NEGATIVE mg/dL
Leukocytes, UA: NEGATIVE
Nitrite: NEGATIVE
PROTEIN: NEGATIVE mg/dL
Specific Gravity, Urine: 1.01 (ref 1.005–1.030)
pH: 6 (ref 5.0–8.0)

## 2018-03-18 LAB — I-STAT CG4 LACTIC ACID, ED
Lactic Acid, Venous: 4.65 mmol/L (ref 0.5–1.9)
Lactic Acid, Venous: 4.55 mmol/L (ref 0.5–1.9)

## 2018-03-18 LAB — WET PREP, GENITAL
CLUE CELLS WET PREP: NONE SEEN
Sperm: NONE SEEN
Trich, Wet Prep: NONE SEEN
Yeast Wet Prep HPF POC: NONE SEEN

## 2018-03-18 LAB — CBC
HCT: 47.7 % — ABNORMAL HIGH (ref 36.0–46.0)
Hemoglobin: 15.5 g/dL — ABNORMAL HIGH (ref 12.0–15.0)
MCH: 29.5 pg (ref 26.0–34.0)
MCHC: 32.5 g/dL (ref 30.0–36.0)
MCV: 90.9 fL (ref 80.0–100.0)
NRBC: 0 % (ref 0.0–0.2)
Platelets: 253 10*3/uL (ref 150–400)
RBC: 5.25 MIL/uL — ABNORMAL HIGH (ref 3.87–5.11)
RDW: 12.5 % (ref 11.5–15.5)
WBC: 4.6 10*3/uL (ref 4.0–10.5)

## 2018-03-18 LAB — HEPATIC FUNCTION PANEL
ALBUMIN: 4.9 g/dL (ref 3.5–5.0)
ALK PHOS: 100 U/L (ref 38–126)
ALT: 78 U/L — ABNORMAL HIGH (ref 0–44)
AST: 74 U/L — ABNORMAL HIGH (ref 15–41)
Bilirubin, Direct: 0.3 mg/dL — ABNORMAL HIGH (ref 0.0–0.2)
Indirect Bilirubin: 1.1 mg/dL — ABNORMAL HIGH (ref 0.3–0.9)
Total Bilirubin: 1.4 mg/dL — ABNORMAL HIGH (ref 0.3–1.2)
Total Protein: 8.1 g/dL (ref 6.5–8.1)

## 2018-03-18 LAB — I-STAT BETA HCG BLOOD, ED (MC, WL, AP ONLY): I-stat hCG, quantitative: <5 m[IU]/mL (ref <5)

## 2018-03-18 LAB — LIPASE, BLOOD: Lipase: 29 U/L (ref 11–51)

## 2018-03-18 SURGERY — CYSTOSCOPY, WITH RETROGRADE PYELOGRAM AND URETERAL STENT INSERTION
Anesthesia: General | Site: Ureter | Laterality: Left

## 2018-03-18 MED ORDER — SODIUM CHLORIDE 0.9 % IR SOLN
Status: DC | PRN
  Administered 2018-03-18: 3000 mL

## 2018-03-18 MED ORDER — PROPOFOL 10 MG/ML IV BOLUS
INTRAVENOUS | Status: AC
  Filled 2018-03-18: qty 20

## 2018-03-18 MED ORDER — ACETAMINOPHEN 500 MG PO TABS
1000.0000 mg | ORAL_TABLET | Freq: Once | ORAL | Status: AC
  Administered 2018-03-18: 1000 mg via ORAL
  Filled 2018-03-18: qty 2

## 2018-03-18 MED ORDER — HYDROMORPHONE HCL 1 MG/ML IJ SOLN
0.5000 mg | Freq: Once | INTRAMUSCULAR | Status: AC
  Administered 2018-03-18: 0.5 mg via INTRAVENOUS
  Filled 2018-03-18: qty 1

## 2018-03-18 MED ORDER — MIDAZOLAM HCL 5 MG/5ML IJ SOLN
INTRAMUSCULAR | Status: DC | PRN
  Administered 2018-03-18: 2 mg via INTRAVENOUS

## 2018-03-18 MED ORDER — SODIUM CHLORIDE 0.9 % IV BOLUS
500.0000 mL | Freq: Once | INTRAVENOUS | Status: AC
  Administered 2018-03-18: 500 mL via INTRAVENOUS

## 2018-03-18 MED ORDER — SODIUM CHLORIDE 0.9 % IV SOLN
1.0000 g | Freq: Once | INTRAVENOUS | Status: AC
  Administered 2018-03-18: 1 g via INTRAVENOUS
  Filled 2018-03-18: qty 10

## 2018-03-18 MED ORDER — LORAZEPAM 2 MG/ML IJ SOLN: 0.2500 mg | Freq: Once | INTRAMUSCULAR | Status: DC | PRN

## 2018-03-18 MED ORDER — SODIUM CHLORIDE 0.9 % IV BOLUS
1000.0000 mL | Freq: Once | INTRAVENOUS | Status: AC
  Administered 2018-03-18: 1000 mL via INTRAVENOUS

## 2018-03-18 MED ORDER — ETOMIDATE 2 MG/ML IV SOLN
INTRAVENOUS | Status: DC | PRN
  Administered 2018-03-18: 14 mg via INTRAVENOUS
  Administered 2018-03-19: 16 mg

## 2018-03-18 MED ORDER — VANCOMYCIN HCL IN DEXTROSE 1-5 GM/200ML-% IV SOLN
1000.0000 mg | Freq: Once | INTRAVENOUS | Status: AC
  Administered 2018-03-18: 1000 mg via INTRAVENOUS
  Filled 2018-03-18: qty 200

## 2018-03-18 MED ORDER — SODIUM CHLORIDE (PF) 0.9 % IJ SOLN
INTRAMUSCULAR | Status: AC
  Filled 2018-03-18: qty 50

## 2018-03-18 MED ORDER — FENTANYL CITRATE (PF) 100 MCG/2ML IJ SOLN
25.0000 ug | Freq: Once | INTRAMUSCULAR | Status: AC
  Administered 2018-03-18: 25 ug via INTRAVENOUS
  Filled 2018-03-18: qty 2

## 2018-03-18 MED ORDER — LIDOCAINE 2% (20 MG/ML) 5 ML SYRINGE
INTRAMUSCULAR | Status: DC | PRN
  Administered 2018-03-18: 60 mg via INTRAVENOUS

## 2018-03-18 MED ORDER — FENTANYL CITRATE (PF) 250 MCG/5ML IJ SOLN
INTRAMUSCULAR | Status: AC
  Filled 2018-03-18: qty 5

## 2018-03-18 MED ORDER — ONDANSETRON HCL 4 MG/2ML IJ SOLN
4.0000 mg | Freq: Once | INTRAMUSCULAR | Status: AC
  Administered 2018-03-18: 4 mg via INTRAVENOUS
  Filled 2018-03-18: qty 2

## 2018-03-18 MED ORDER — IOPAMIDOL (ISOVUE-300) INJECTION 61%
100.0000 mL | Freq: Once | INTRAVENOUS | Status: AC | PRN
  Administered 2018-03-18: 100 mL via INTRAVENOUS

## 2018-03-18 MED ORDER — LORAZEPAM 2 MG/ML IJ SOLN
0.2500 mg | Freq: Once | INTRAMUSCULAR | Status: AC
  Administered 2018-03-18: 0.25 mg via INTRAVENOUS
  Filled 2018-03-18: qty 1

## 2018-03-18 MED ORDER — MORPHINE SULFATE (PF) 4 MG/ML IV SOLN
4.0000 mg | Freq: Once | INTRAVENOUS | Status: AC
  Administered 2018-03-18: 4 mg via INTRAVENOUS
  Filled 2018-03-18: qty 1

## 2018-03-18 MED ORDER — FENTANYL CITRATE (PF) 100 MCG/2ML IJ SOLN
INTRAMUSCULAR | Status: DC | PRN
  Administered 2018-03-18 (×2): 50 ug via INTRAVENOUS

## 2018-03-18 MED ORDER — MIDAZOLAM HCL 2 MG/2ML IJ SOLN
INTRAMUSCULAR | Status: AC
  Filled 2018-03-18: qty 2

## 2018-03-18 MED ORDER — ONDANSETRON HCL 4 MG/2ML IJ SOLN
INTRAMUSCULAR | Status: AC
  Filled 2018-03-18: qty 2

## 2018-03-18 MED ORDER — OXYCODONE-ACETAMINOPHEN 5-325 MG PO TABS
1.0000 | ORAL_TABLET | ORAL | Status: DC | PRN
  Administered 2018-03-18: 1 via ORAL
  Filled 2018-03-18: qty 1

## 2018-03-18 MED ORDER — LORAZEPAM 0.5 MG PO TABS: 0.5000 mg | ORAL_TABLET | ORAL | Status: DC | PRN

## 2018-03-18 MED ORDER — IOPAMIDOL (ISOVUE-300) INJECTION 61%
INTRAVENOUS | Status: AC
  Filled 2018-03-18: qty 100

## 2018-03-18 MED ORDER — IOHEXOL 300 MG/ML  SOLN
INTRAMUSCULAR | Status: DC | PRN
  Administered 2018-03-18: 3 mL

## 2018-03-18 MED ORDER — DEXAMETHASONE SODIUM PHOSPHATE 10 MG/ML IJ SOLN
INTRAMUSCULAR | Status: AC
  Filled 2018-03-18: qty 1

## 2018-03-18 MED ORDER — KETOROLAC TROMETHAMINE 15 MG/ML IJ SOLN
30.0000 mg | Freq: Once | INTRAMUSCULAR | Status: AC
  Administered 2018-03-18: 30 mg via INTRAVENOUS
  Filled 2018-03-18: qty 2

## 2018-03-18 MED ORDER — SODIUM CHLORIDE 0.9 % IV SOLN
1.0000 g | Freq: Once | INTRAVENOUS | Status: AC
  Administered 2018-03-18: 1 g via INTRAVENOUS

## 2018-03-18 MED ORDER — SODIUM CHLORIDE 0.9 % IV SOLN
INTRAVENOUS | Status: AC
  Filled 2018-03-18: qty 10

## 2018-03-18 MED ORDER — IBUPROFEN 800 MG PO TABS
800.0000 mg | ORAL_TABLET | Freq: Once | ORAL | Status: AC
  Administered 2018-03-18: 800 mg via ORAL
  Filled 2018-03-18: qty 1

## 2018-03-18 SURGICAL SUPPLY — 14 items
BAG URO CATCHER STRL LF (MISCELLANEOUS) ×2 IMPLANT
BASKET ZERO TIP NITINOL 2.4FR (BASKET) IMPLANT
CATH INTERMIT  6FR 70CM (CATHETERS) IMPLANT
CLOTH BEACON ORANGE TIMEOUT ST (SAFETY) ×2 IMPLANT
COVER WAND RF STERILE (DRAPES) IMPLANT
GLOVE BIOGEL M STRL SZ7.5 (GLOVE) ×2 IMPLANT
GOWN STRL REUS W/TWL LRG LVL3 (GOWN DISPOSABLE) ×2 IMPLANT
GOWN STRL REUS W/TWL XL LVL3 (GOWN DISPOSABLE) ×2 IMPLANT
GUIDEWIRE ANG ZIPWIRE 038X150 (WIRE) IMPLANT
GUIDEWIRE STR DUAL SENSOR (WIRE) ×2 IMPLANT
MANIFOLD NEPTUNE II (INSTRUMENTS) ×2 IMPLANT
PACK CYSTO (CUSTOM PROCEDURE TRAY) ×2 IMPLANT
STENT URET 6FRX24 CONTOUR (STENTS) ×1 IMPLANT
TUBING CONNECTING 10 (TUBING) ×2 IMPLANT

## 2018-03-18 NOTE — ED Triage Notes (Signed)
Patient complains of pain in the abdomin that radiates to the left flanks. Patient has had intense pain since this AM.

## 2018-03-18 NOTE — ED Provider Notes (Addendum)
[facility]-EMERGENCY DEPT Provider Note   CSN: [id number] Arrival date & time: 03/18/18  1128     History   Chief Complaint Chief Complaint  Patient presents with  . Abdominal Pain  . Flank Pain    HPI Stephanie Patrick is a 33 y.o. female.  HPI   Pt is a 33 y/o female with a history of ADHD, depression, migraines, urinary tract infection, who presents emergency department today for evaluation of abdominal pain and left flank pain that began this morning.  States pain started suddenly has increased throughout the day.  Pain is constant.  Pain located suprapubically and to the left side of the abdomen.  Associated with nausea and vomiting.  She has urine urinary frequency as well for the last several weeks.  No dysuria.  No abnormal vaginal discharge.  States she is sexually active with one partner.  She denies concern for STD.  Denies any diarrhea, constipation.  No fevers since 1 week ago when she thought she had an ear infection. States she has had recent cough, congestion, and uri sxs. These have persisted. She also reports that she has felt very anxious. She normally feels sob and has some chest pressure with her anxiety. She has had these sxs for some time and attributes them to this. Was on OCPs but stopped them 3 months ago. No recent surgeries, admission to the hospital, or hemoptysis. Does feel like her legs have been swollen for the last several months. No h/o CA or DVT/PE. Has h/o kidney stones but has never passed a stone before.   Past Medical History:  Diagnosis Date  . ADHD 02/13/2017  . Chicken pox   . Depression   . Migraines   . Urinary tract infection     Patient Active Problem List   Diagnosis Date Noted  . Sepsis, Gram negative (HCC) 03/18/2018  . Nephrolithiasis 03/18/2018  . Migraines 02/16/2017  . Vitamin D deficiency 02/16/2017  . Preventative health care 02/16/2017  . Insomnia 02/16/2017  . Recurrent UTI 02/13/2017  . Depression  02/13/2017  . ADHD 02/13/2017    History reviewed. No pertinent surgical history.   OB History    Gravida  1   Para      Term      Preterm      AB  1   Living        SAB  1   TAB      Ectopic      Multiple      Live Births               Home Medications    Prior to Admission medications   Medication Sig Start Date End Date Taking? Authorizing Provider  amphetamine-dextroamphetamine (ADDERALL) 5 MG tablet Take 5 mg by mouth daily.   Yes [provider]  diphenhydramine-acetaminophen (TYLENOL PM) 25-500 MG TABS tablet Take 1 tablet by mouth at bedtime as needed (sleep).   Yes [provider]  fluticasone (FLONASE) 50 MCG/ACT nasal spray Place 1 spray into both nostrils 2 (two) times daily. 03/09/18 05/08/18 Yes Betancourt, Jarold Song, NP  Phenylephrine HCl 5 MG TABS Take 1 tablet (5 mg total) by mouth 4 (four) times daily as needed for up to 7 days. 03/11/18 03/18/18 Yes Betancourt, Jarold Song, NP  predniSONE (STERAPRED UNI-PAK 21 TAB) 10 MG (21) TBPK tablet Taper take 6 pills by mouth day 1; 5 pills day 2; 4 pills day 3; 3 pills day 4;  2 pills day 5 and 1 pill day 6 with breakfast daily Patient not taking: Reported on 03/18/2018 03/11/18   Barbaraann BarthelBetancourt, Tina A, NP    Family History Family History  Problem Relation Age of Onset  . Cancer Maternal Grandmother   . Diabetes Maternal Grandmother   . Hypertension Maternal Grandmother     Social History Social History   Tobacco Use  . Smoking status: Never Smoker  . Smokeless tobacco: Never Used  Substance Use Topics  . Alcohol use: Yes  . Drug use: No     Allergies   Vicodin [hydrocodone-acetaminophen]   Review of Systems Review of Systems  Constitutional: Negative for fever.  HENT: Negative for ear pain and sore throat.   Eyes: Negative for pain and visual disturbance.  Respiratory: Negative for cough and shortness of breath.   Cardiovascular: Negative for chest pain.  Gastrointestinal: Positive  for abdominal pain, nausea and vomiting. Negative for constipation and diarrhea.  Genitourinary: Positive for flank pain, frequency and pelvic pain. Negative for dysuria, hematuria, vaginal bleeding and vaginal discharge.  Musculoskeletal: Negative for back pain.  Skin: Negative for rash.  Neurological: Negative for headaches.  All other systems reviewed and are negative.    Physical Exam Updated Vital Signs BP 96/61   Pulse (!) 156   Temp (!) 100.9 F (38.3 C) (Rectal)   Resp (!) 27   Ht 5\' 2"  (1.575 m)   Wt 79.4 kg   LMP 03/01/2018   SpO2 96%   BMI 32.01 kg/m   Physical Exam Vitals signs and nursing note reviewed.  Constitutional:      General: She is in acute distress.     Appearance: She is well-developed.  HENT:     Head: Normocephalic and atraumatic.  Eyes:     Conjunctiva/sclera: Conjunctivae normal.  Neck:     Musculoskeletal: Neck supple.  Cardiovascular:     Rate and Rhythm: Normal rate and regular rhythm.     Heart sounds: Normal heart sounds. No murmur.  Pulmonary:     Effort: Pulmonary effort is normal. No respiratory distress.     Breath sounds: Normal breath sounds. No wheezing or rhonchi.  Abdominal:     Palpations: Abdomen is soft.     Comments: Pt with soft abd. TTP to LUQ, LLQ and suprapubically. Bilat CVA TTP (L>R).  Genitourinary:    Comments: Exam performed by Karrie Meresortni S Hailynn Slovacek,  exam chaperoned Date: 03/19/2018 Pelvic exam: normal external genitalia without evidence of trauma. VULVA: normal appearing vulva with no masses, tenderness or lesion. VAGINA: normal appearing vagina with normal color, no lesions. White/yellow discharge present. CERVIX: normal appearing cervix without lesions, cervical motion tenderness absent, cervical os closed with out purulent discharge; vaginal discharge - present, yellow/white, Wet prep and DNA probe for chlamydia and GC obtained.   ADNEXA: normal adnexa in size, nontender and no masses UTERUS: uterus is normal  size, shape, consistency and nontender.   Skin:    General: Skin is warm and dry.  Neurological:     Mental Status: She is alert.  Psychiatric:        Mood and Affect: Mood is anxious.     Comments: tearful    ED Treatments / Results  Labs (all labs ordered are listed, but only abnormal results are displayed) Labs Reviewed  WET PREP, GENITAL - Abnormal; Notable for the following components:      Result Value   WBC, Wet Prep HPF POC RARE (*)    All other  components within normal limits  URINALYSIS, ROUTINE W REFLEX MICROSCOPIC - Abnormal; Notable for the following components:   Hgb urine dipstick MODERATE (*)    Bacteria, UA MANY (*)    All other components within normal limits  CBC - Abnormal; Notable for the following components:   RBC 5.25 (*)    Hemoglobin 15.5 (*)    HCT 47.7 (*)    All other components within normal limits  BASIC METABOLIC PANEL - Abnormal; Notable for the following components:   CO2 21 (*)    All other components within normal limits  HEPATIC FUNCTION PANEL - Abnormal; Notable for the following components:   AST 74 (*)    ALT 78 (*)    Total Bilirubin 1.4 (*)    Bilirubin, Direct 0.3 (*)    Indirect Bilirubin 1.1 (*)    All other components within normal limits  I-STAT CG4 LACTIC ACID, ED - Abnormal; Notable for the following components:   Lactic Acid, Venous 4.65 (*)    All other components within normal limits  I-STAT CG4 LACTIC ACID, ED - Abnormal; Notable for the following components:   Lactic Acid, Venous 4.55 (*)    All other components within normal limits  CULTURE, BLOOD (ROUTINE X 2)  CULTURE, BLOOD (ROUTINE X 2)  URINE CULTURE  SURGICAL PCR SCREEN  LIPASE, BLOOD  INFLUENZA PANEL BY PCR (TYPE A & B)  I-STAT BETA HCG BLOOD, ED ([facility], [facility], [facility] ONLY)  GC/CHLAMYDIA PROBE AMP ([place]) NOT AT [facility]    EKG EKG Interpretation  Date/Time:  Thursday March 18 2018 19:08:50 EST Ventricular Rate:  157 PR Interval:    QRS  Duration: 66 QT Interval:  242 QTC Calculation: 391 R Axis:   56 Text Interpretation:  Age not entered, assumed to be  33 years old for purpose of ECG interpretation Sinus tachycardia Atrial premature complex Consider right atrial enlargement Low voltage, precordial leads Abnormal ekg Confirmed by Gerhard MunchLockwood, Robert [phone]([id number]) on 03/18/2018 7:53:49 PM   Radiology Dg Chest 2 View  Result Date: 03/18/2018 CLINICAL DATA:  Abdominal and LEFT flank pain since this morning EXAM: CHEST - 2 VIEW COMPARISON:  None FINDINGS: Borderline enlargement of cardiac silhouette. Mediastinal contours and pulmonary vascularity normal. RIGHT basilar atelectasis and generally low lung volumes. Upper lungs clear. No pneumothorax. Bones unremarkable. IMPRESSION: Low lung volumes with accentuated heart size and RIGHT basilar atelectasis. Electronically Signed   By: Ulyses SouthwardMark  Boles M.D.   On: 03/18/2018 20:06   Ct Abdomen Pelvis W Contrast  Result Date: 03/18/2018 CLINICAL DATA:  Patient with left flank pain. EXAM: CT ABDOMEN AND PELVIS WITH CONTRAST TECHNIQUE: Multidetector CT imaging of the abdomen and pelvis was performed using the standard protocol following bolus administration of intravenous contrast. CONTRAST:  100mL ISOVUE-300 IOPAMIDOL (ISOVUE-300) INJECTION 61% COMPARISON:  None. FINDINGS: Lower chest: Normal heart size. Small hiatal hernia. Dependent atelectasis within the left lower lobe. No pleural effusion. Hepatobiliary: Liver is normal in size and contour. No focal hepatic lesions identified. Gallbladder is unremarkable. No intrahepatic or extrahepatic biliary ductal dilatation. Pancreas: Unremarkable Spleen: Unremarkable Adrenals/Urinary Tract: Normal adrenal glands. There is delayed enhancement of the left kidney and moderate left hydroureteronephrosis to the level of the distal left ureter were there is an obstructing 4 mm stone (image 77; series 2). Urinary bladder is unremarkable. Stomach/Bowel: Transverse and  descending colon is decompressed, limiting evaluation. Small bowel feces within the distal small bowel. No free fluid or free intraperitoneal air. Vascular/Lymphatic: Normal caliber abdominal aorta.  No retroperitoneal lymphadenopathy. Reproductive: Uterus is unremarkable. Adnexal structures unremarkable. Other: None. Musculoskeletal: Lumbar spine degenerative changes. No aggressive or acute appearing osseous lesions. IMPRESSION: There is an obstructing 4 mm stone within the distal left ureter resulting in mild to moderate left hydroureteronephrosis. Small bowel feces within the distal small bowel which may be secondary to slow transient. Electronically Signed   By: Annia Belt M.D.   On: 03/18/2018 18:23    Procedures Procedures (including critical care time)  CRITICAL CARE Performed by: Karrie Meres   Total critical care time: 40 minutes  Critical care time was exclusive of separately billable procedures and treating other patients.  Critical care was necessary to treat or prevent imminent or life-threatening deterioration.  Critical care was time spent personally by me on the following activities: development of treatment plan with patient and/or surrogate as well as nursing, discussions with consultants, evaluation of patient's response to treatment, examination of patient, obtaining history from patient or surrogate, ordering and performing treatments and interventions, ordering and review of laboratory studies, ordering and review of radiographic studies, pulse oximetry and re-evaluation of patient's condition.   Medications Ordered in ED Medications  iopamidol (ISOVUE-300) 61 % injection (has no administration in time range)  sodium chloride (PF) 0.9 % injection (has no administration in time range)  LORazepam (ATIVAN) injection 0.25 mg (has no administration in time range)  cefTRIAXone (ROCEPHIN) 1 g in sodium chloride 0.9 % 100 mL IVPB (has no administration in time range)   sodium chloride 0.9 % bolus 1,000 mL (0 mLs Intravenous Stopped 03/18/18 1920)  ondansetron (ZOFRAN) injection 4 mg (4 mg Intravenous Given 03/18/18 1511)  morphine 4 MG/ML injection 4 mg (4 mg Intravenous Given 03/18/18 1512)  HYDROmorphone (DILAUDID) injection 0.5 mg (0.5 mg Intravenous Given 03/18/18 1606)  iopamidol (ISOVUE-300) 61 % injection 100 mL (100 mLs Intravenous Contrast Given 03/18/18 1731)  acetaminophen (TYLENOL) tablet 1,000 mg (1,000 mg Oral Given 03/18/18 1839)  sodium chloride 0.9 % bolus 1,000 mL (0 mLs Intravenous Stopped 03/18/18 2132)  cefTRIAXone (ROCEPHIN) 1 g in sodium chloride 0.9 % 100 mL IVPB (0 g Intravenous Stopped 03/18/18 2133)  ketorolac (TORADOL) 15 MG/ML injection 30 mg (30 mg Intravenous Given 03/18/18 1919)  vancomycin (VANCOCIN) IVPB 1000 mg/200 mL premix (0 mg Intravenous Stopped 03/18/18 2133)  fentaNYL (SUBLIMAZE) injection 25 mcg (25 mcg Intravenous Given 03/18/18 2011)  sodium chloride 0.9 % bolus 500 mL (0 mLs Intravenous Stopped 03/18/18 2133)  sodium chloride 0.9 % bolus 500 mL (0 mLs Intravenous Stopped 03/18/18 2234)  ibuprofen (ADVIL,MOTRIN) tablet 800 mg (800 mg Oral Given 03/18/18 2036)  ondansetron (ZOFRAN) injection 4 mg (4 mg Intravenous Given 03/18/18 2036)  LORazepam (ATIVAN) injection 0.25 mg (0.25 mg Intravenous Given 03/18/18 2202)     Initial Impression / Assessment and Plan / ED Course  I have reviewed the triage vital signs and the nursing notes.  Pertinent labs & imaging results that were available during my care of the patient were reviewed by me and considered in my medical decision making (see chart for details).    Pt seen in conjunction with Dr. Jeraldine Loots who evaluated the pt and is in agreement with the plan.   Final Clinical Impressions(s) / ED Diagnoses   Final diagnoses:  Sepsis, due to unspecified organism, unspecified whether acute organ dysfunction present (HCC)   Pt presenting with left flank pain and urinary sxs. Has known  h/o frequent UTI and kidney stones but no ureteral stones. Tachycardic on arrival  but otherwise VSS and no fevers. Pt anxious and tearful on exam  Pt with soft abd. TTP to LUQ, LLQ and suprapubically. Bilat CVA TTP (L>R).  Pelvic exam without CMT, uterine or adnexal tenderness.  There is some discharge present on exam.  CBC w/o leukocytosis, elevated hgb. BMP with normal electrolytes. Normal kidney function Hepatic function panel lipase UA with hematuria and many bacteria. No leuks or nitrites. 0-5 wbc and rbc. CXR negative.  3:40 PM Re-eval, pt feels somewhat improved after medications. Repeat abdominal exam with continued left CVA tenderness and now with right lower quadrant tenderness.  Negative Rovsing's.  No rebound tenderness.  Will obtain CT abdomen pelvis to rule out intra-abdominal pathology.  5:30 PM Informed by nursing staff the pt is c/o continued pain. Re-evaluated pt. She is now rigoring and appears in more distress. Has continued left CVA TTP. Temperature taken by myself and was 105F orally. HR noted to be tachycardic in 160s. reviewed results of CT abd in room which showed obstructing left ureteral stone.  Code sepsis initiated, IVF and abx ordered. EKG ordered.  8:30 PM Re-evaluated pt. Abdomen soft and nontender. Left CVA TTP still present. HR 130s-140s on monitor. satting at 95% on RA. BP >100 systolic. States her pain is improving. Continues to have elevated temp at 100.49F, ibuprofen given. Is c/o nausea as well. Zofran ordered.  9:20 PM CONSULT with Dr. Mikeal Hawthorne with hospitalist service who will admit the patient to medicine for further tx of sepsis with suspected source of infected kidney stone.   Pt noted to be retaining 500cc urine. Dr. Mikeal Hawthorne request urology consult.  Discussed case with Dr. Mena Goes with urology who states that patient with likely need stented. He will see patient.   ED Discharge Orders    None       Rayne Du 03/18/18 2252     Gerhard Munch, MD 03/18/18 2349    Samson Frederic, Saurav Crumble S, PA-C 03/19/18 1015    Gerhard Munch, MD 03/26/18 1213

## 2018-03-18 NOTE — Consult Note (Addendum)
Consultation: Left distal ureteral stone, left hydronephrosis, sepsis Requested by: Dr. Earlie Lou  History of Present Illness: Stephanie Patrick is a 33 year old female who developed left flank pain this morning.  She was found to be septic with a temperature of 105 and tachycardia.  Her urine showed many bacteria, 0-5 whites and 0-5 reds.  White count and creatinine normal.  CT scan revealed a 5 mm distal left ureteral stone but there was proximal mild - moderate hydronephrosis and decreased enhancement from the left kidney consistent with high-grade obstruction.  The left kidney may also have some edema. A foley was placed for a bladder scan ~ 500 and pt couldn't void. She continues to have left flank pain and tachycardia. She denies prior stone or GU history.   Past Medical History:  Diagnosis Date  . ADHD 02/13/2017  . Chicken pox   . Depression   . Migraines   . Urinary tract infection    History reviewed. No pertinent surgical history.  Home Medications:  (Not in a hospital admission)  Allergies:  Allergies  Allergen Reactions  . Vicodin [Hydrocodone-Acetaminophen] Nausea And Vomiting    Family History  Problem Relation Age of Onset  . Cancer Maternal Grandmother   . Diabetes Maternal Grandmother   . Hypertension Maternal Grandmother    Social History:  reports that she has never smoked. She has never used smokeless tobacco. She reports current alcohol use. She reports that she does not use drugs.  ROS: A complete review of systems was performed.  All systems are negative except for pertinent findings as noted. Review of Systems  All other systems reviewed and are negative.    Physical Exam:  Vital signs in last 24 hours: Temp:  [98.1 F (36.7 C)-105 F (40.6 C)] 100.9 F (38.3 C) (01/16 2212) Pulse Rate:  [75-163] 156 (01/16 2145) Resp:  [20-27] 27 (01/16 2145) BP: (87-127)/(44-90) 96/61 (01/16 2145) SpO2:  [92 %-100 %] 96 % (01/16 2145) Weight:  [79.4 kg]  79.4 kg (01/16 1137) General:  Alert and oriented, No acute distress, tepid HEENT: Normocephalic, atraumatic Cardiovascular: Regular rate and rhythm Lungs: Regular rate and effort Abdomen: Soft, nontender, nondistended, no abdominal masses Back: No CVA tenderness Extremities: No edema Neurologic: Grossly intact  Laboratory Data:  Results for orders placed or performed during the hospital encounter of 03/18/18 (from the past 24 hour(s))  I-Stat beta hCG blood, ED     Status: None   Collection Time: 03/18/18  1:39 PM  Result Value Ref Range   I-stat hCG, quantitative <5.0 <5 mIU/mL   Comment 3          CBC     Status: Abnormal   Collection Time: 03/18/18  1:43 PM  Result Value Ref Range   WBC 4.6 4.0 - 10.5 K/uL   RBC 5.25 (H) 3.87 - 5.11 MIL/uL   Hemoglobin 15.5 (H) 12.0 - 15.0 g/dL   HCT 01.6 (H) 55.3 - 74.8 %   MCV 90.9 80.0 - 100.0 fL   MCH 29.5 26.0 - 34.0 pg   MCHC 32.5 30.0 - 36.0 g/dL   RDW 27.0 78.6 - 75.4 %   Platelets 253 150 - 400 K/uL   nRBC 0.0 0.0 - 0.2 %  Basic metabolic panel     Status: Abnormal   Collection Time: 03/18/18  1:43 PM  Result Value Ref Range   Sodium 135 135 - 145 mmol/L   Potassium 4.1 3.5 - 5.1 mmol/L   Chloride 103 98 -  111 mmol/L   CO2 21 (L) 22 - 32 mmol/L   Glucose, Bld 85 70 - 99 mg/dL   BUN 14 6 - 20 mg/dL   Creatinine, Ser 1.74 0.44 - 1.00 mg/dL   Calcium 9.3 8.9 - 94.4 mg/dL   GFR calc non Af Amer >60 >60 mL/min   GFR calc Af Amer >60 >60 mL/min   Anion gap 11 5 - 15  Hepatic function panel     Status: Abnormal   Collection Time: 03/18/18  1:43 PM  Result Value Ref Range   Total Protein 8.1 6.5 - 8.1 g/dL   Albumin 4.9 3.5 - 5.0 g/dL   AST 74 (H) 15 - 41 U/L   ALT 78 (H) 0 - 44 U/L   Alkaline Phosphatase 100 38 - 126 U/L   Total Bilirubin 1.4 (H) 0.3 - 1.2 mg/dL   Bilirubin, Direct 0.3 (H) 0.0 - 0.2 mg/dL   Indirect Bilirubin 1.1 (H) 0.3 - 0.9 mg/dL  Lipase, blood     Status: None   Collection Time: 03/18/18  1:43 PM   Result Value Ref Range   Lipase 29 11 - 51 U/L  Urinalysis, Routine w reflex microscopic- may I&O cath if menses     Status: Abnormal   Collection Time: 03/18/18  1:45 PM  Result Value Ref Range   Color, Urine YELLOW YELLOW   APPearance CLEAR CLEAR   Specific Gravity, Urine 1.010 1.005 - 1.030   pH 6.0 5.0 - 8.0   Glucose, UA NEGATIVE NEGATIVE mg/dL   Hgb urine dipstick MODERATE (A) NEGATIVE   Bilirubin Urine NEGATIVE NEGATIVE   Ketones, ur NEGATIVE NEGATIVE mg/dL   Protein, ur NEGATIVE NEGATIVE mg/dL   Nitrite NEGATIVE NEGATIVE   Leukocytes, UA NEGATIVE NEGATIVE   RBC / HPF 0-5 0 - 5 RBC/hpf   WBC, UA 0-5 0 - 5 WBC/hpf   Bacteria, UA MANY (A) NONE SEEN   Squamous Epithelial / LPF 0-5 0 - 5  Wet prep, genital     Status: Abnormal   Collection Time: 03/18/18  3:36 PM  Result Value Ref Range   Yeast Wet Prep HPF POC NONE SEEN NONE SEEN   Trich, Wet Prep NONE SEEN NONE SEEN   Clue Cells Wet Prep HPF POC NONE SEEN NONE SEEN   WBC, Wet Prep HPF POC RARE (A) NONE SEEN   Sperm NONE SEEN   I-Stat CG4 Lactic Acid, ED     Status: Abnormal   Collection Time: 03/18/18  7:13 PM  Result Value Ref Range   Lactic Acid, Venous 4.65 (HH) 0.5 - 1.9 mmol/L   Comment NOTIFIED PHYSICIAN   Influenza panel by PCR (type A & B)     Status: None   Collection Time: 03/18/18  7:29 PM  Result Value Ref Range   Influenza A By PCR NEGATIVE NEGATIVE   Influenza B By PCR NEGATIVE NEGATIVE  I-Stat CG4 Lactic Acid, ED     Status: Abnormal   Collection Time: 03/18/18 10:08 PM  Result Value Ref Range   Lactic Acid, Venous 4.55 (HH) 0.5 - 1.9 mmol/L   Comment NOTIFIED PHYSICIAN    Recent Results (from the past 240 hour(s))  Wet prep, genital     Status: Abnormal   Collection Time: 03/18/18  3:36 PM  Result Value Ref Range Status   Yeast Wet Prep HPF POC NONE SEEN NONE SEEN Final   Trich, Wet Prep NONE SEEN NONE SEEN Final   Clue Cells Wet Prep  HPF POC NONE SEEN NONE SEEN Final   WBC, Wet Prep HPF POC  RARE (A) NONE SEEN Final    Comment: Specimen diluted due to transport tube containing more than 1 ml of saline, interpret results with caution.   Sperm NONE SEEN  Final    Comment: Performed at [facility], [address], [place], [place] [address]   Creatinine: Recent Labs    03/18/18 1343  CREATININE 0.93    Impression/Assessment/plan:  I discussed with the patient and her boy firend the nature, potential benefits, risks and alternatives to cystoscopy, left retrograde pyelogram, left ureteral stent placement, including side effects of the proposed treatment, the likelihood of the patient achieving the goals of the procedure, and any potential problems that might occur during the procedure or recuperation. We discussed non-operative management and continued stone passage. I wanted to add we discussed the rationale for a staged procedure and that she may have passed the stone. All questions answered. Patient elects to proceed.    Jerilee Field 03/18/2018, 10:27 PM

## 2018-03-18 NOTE — ED Notes (Signed)
Notified EDP,Lockwood,MD. Pt. I-stat CG4 Lactic acid results 4.55 and RN,Kiristin made aware.

## 2018-03-18 NOTE — Anesthesia Preprocedure Evaluation (Addendum)
Anesthesia Evaluation  Patient identified by MRN, date of birth, ID band  Reviewed: Allergy & Precautions, NPO status , Patient's Chart, lab work & pertinent test results  Airway Mallampati: II  TM Distance: >3 FB Neck ROM: Full    Dental  (+) Teeth Intact, Dental Advisory Given   Pulmonary    breath sounds clear to auscultation       Cardiovascular  Rhythm:Regular Rate:Tachycardia     Neuro/Psych    GI/Hepatic   Endo/Other    Renal/GU      Musculoskeletal   Abdominal (+) + obese,   Peds  Hematology   Anesthesia Other Findings   Reproductive/Obstetrics                             Anesthesia Physical Anesthesia Plan  ASA: III and emergent  Anesthesia Plan: General   Post-op Pain Management:    Induction: Intravenous, Rapid sequence and Cricoid pressure planned  PONV Risk Score and Plan: Ondansetron and Dexamethasone  Airway Management Planned: Oral ETT  Additional Equipment:   Intra-op Plan:   Post-operative Plan: Extubation in OR  Informed Consent: I have reviewed the patients History and Physical, chart, labs and discussed the procedure including the risks, benefits and alternatives for the proposed anesthesia with the patient or authorized representative who has indicated his/her understanding and acceptance.     Dental advisory given  Plan Discussed with: CRNA and Anesthesiologist  Anesthesia Plan Comments:         Anesthesia Quick Evaluation

## 2018-03-18 NOTE — Progress Notes (Signed)
Subjective:    Patient ID: Stephanie Patrick, female    DOB: 11-19-1985, 33 y.o.   MRN: [id number]  Pt in to see RN reporting pain with urination and L flank pain since yesterday. Believes she has UTI or passing kidney stone. Has not passed stones before but has been told they are present after a CT scan before. Denies family Hx kidney stones. She was able to ambulate easily into the clinic, but became more restless over 15-73min. Unable to sit or stand still. Describes pain as colicky in nature. Had given Ibuprofen at pt request prior to c/o nausea which developed while in clinic. Had been drinking 1.5L bottle of water in clinic and vomited bile and water x2. Pain worsening while in clinic. Ice applied to L flank pain. No fever, chills. Pt was considering UC when she presented but pain intensified quickly and she now requests ED. Reports she does not have any coworkers she would feel comfortable asking to drive her to ED and she has no family in the area to call. She is going by [employer]. Pt escorted outside and assisted into vehicle. RN will f/u with pt tomorrow.  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  Unsure of father's medical history if he had kidney stones, mother and grandparents did not.  Denied hematuria.  Tried to urinate today this am prior to NP arrival in clinic and was not able to do so.  Had stopped drinking water due to nausea and recent vomiting.  Had some dizzyness with sitting up in clinic after vomiting prior to my arrival.  Patient upset no family living close by and recent romantic relationship had ended in the past 30 days.  Recent viral URI and exposure to sick coworkers.  Recently restarted adderall 5mg .  Had beer and hamburger last night no breakfast this am.     Review of Systems  Constitutional: Negative for activity change, appetite change, chills, diaphoresis, fatigue and fever.  HENT: Positive for congestion, postnasal drip,  rhinorrhea and sore throat. Negative for ear discharge, ear pain, facial swelling, hearing loss, mouth sores, nosebleeds, trouble swallowing and voice change.   Eyes: Negative for photophobia and visual disturbance.  Respiratory: Negative for cough, choking, chest tightness, shortness of breath, wheezing and stridor.   Cardiovascular: Negative for chest pain, palpitations and leg swelling.  Gastrointestinal: Positive for nausea and vomiting. Negative for abdominal distention and diarrhea.  Endocrine: Negative for cold intolerance and heat intolerance.  Genitourinary: Positive for difficulty urinating, flank pain and urgency. Negative for menstrual problem.  Musculoskeletal: Positive for back pain and myalgias. Negative for arthralgias, gait problem, joint swelling, neck pain and neck stiffness.  Allergic/Immunologic: Negative for food allergies.  Neurological: Positive for dizziness. Negative for tremors, seizures, syncope, facial asymmetry, speech difficulty, weakness, light-headedness, numbness and headaches.  Hematological: Negative for adenopathy. Does not bruise/bleed easily.  Psychiatric/Behavioral: Positive for agitation. Negative for behavioral problems, confusion, decreased concentration, hallucinations, self-injury, sleep disturbance and suicidal ideas. The patient is nervous/anxious.        Objective:   Physical Exam Vitals signs and nursing note reviewed.  Constitutional:      General: She is in acute distress.     Appearance: She is well-developed, well-groomed and overweight. She is not ill-appearing, toxic-appearing or diaphoretic.  HENT:     Head: Normocephalic and atraumatic.     Jaw: There is normal jaw occlusion. No trismus.     Salivary Glands: Right salivary gland  is not diffusely enlarged or tender. Left salivary gland is not diffusely enlarged or tender.     Right Ear: Hearing, ear canal and external ear normal. A middle ear effusion is present.     Left Ear: Hearing,  ear canal and external ear normal. A middle ear effusion is present.     Nose: Mucosal edema, congestion and rhinorrhea present. No nasal deformity, septal deviation or laceration.     Right Sinus: Maxillary sinus tenderness and frontal sinus tenderness present.     Left Sinus: No maxillary sinus tenderness or frontal sinus tenderness.     Mouth/Throat:     Mouth: Mucous membranes are moist. Mucous membranes are not pale, not dry and not cyanotic. No lacerations or oral lesions.     Dentition: Normal dentition. Does not have dentures. No dental caries or dental abscesses.     Pharynx: Uvula midline. Posterior oropharyngeal erythema present. No oropharyngeal exudate or uvula swelling.     Tonsils: No tonsillar abscesses.  Eyes:     General: Lids are normal. Allergic shiner present. No visual field deficit or scleral icterus.       Right eye: No foreign body, discharge or hordeolum.        Left eye: No foreign body, discharge or hordeolum.     Extraocular Movements: Extraocular movements intact.     Right eye: Normal extraocular motion and no nystagmus.     Left eye: Normal extraocular motion and no nystagmus.     Conjunctiva/sclera: Conjunctivae normal.     Right eye: Right conjunctiva is not injected. No chemosis, exudate or hemorrhage.    Left eye: Left conjunctiva is not injected. No chemosis, exudate or hemorrhage.    Pupils: Pupils are equal, round, and reactive to light. Pupils are equal.     Right eye: Pupil is round and reactive.     Left eye: Pupil is round and reactive.  Neck:     Musculoskeletal: Normal range of motion and neck supple. Normal range of motion. No edema, erythema, neck rigidity, spinous process tenderness or muscular tenderness.     Thyroid: No thyroid mass or thyromegaly.     Trachea: Trachea and phonation normal. No tracheal tenderness or tracheal deviation.  Cardiovascular:     Rate and Rhythm: Normal rate and regular rhythm.     Chest Wall: PMI is not  displaced.     Pulses:          Radial pulses are 2+ on the right side and 2+ on the left side.     Heart sounds: Normal heart sounds, S1 normal and S2 normal. No murmur. No friction rub. No gallop.   Pulmonary:     Effort: Pulmonary effort is normal. No accessory muscle usage or respiratory distress.     Breath sounds: Normal breath sounds and air entry. No stridor, decreased air movement or transmitted upper airway sounds. No decreased breath sounds, wheezing, rhonchi or rales.     Comments: No cough observed in exam room; spoke full sentences without difficulty Chest:     Chest wall: No tenderness.  Abdominal:     General: Bowel sounds are decreased. There is no distension.     Palpations: Abdomen is soft. There is no shifting dullness, fluid wave, hepatomegaly, mass or pulsatile mass.     Tenderness: There is no abdominal tenderness. There is left CVA tenderness. There is no right CVA tenderness, guarding or rebound. Negative signs include Murphy's sign.     Hernia: No  hernia is present.     Comments: Dull to percussion x 4 quads; hypoactive bowel sounds x 4 quads  Musculoskeletal: Normal range of motion.        General: No swelling or tenderness.     Right shoulder: Normal.     Left shoulder: Normal.     Right elbow: Normal.    Left elbow: Normal.     Right hip: Normal.     Left hip: Normal.     Right knee: Normal.     Left knee: Normal.     Cervical back: Normal.     Thoracic back: She exhibits pain. She exhibits normal range of motion, no swelling, no edema, no deformity, no laceration and no spasm.     Lumbar back: She exhibits pain. She exhibits no bony tenderness, no swelling, no edema, no deformity and no laceration.       Back:     Right hand: Normal.     Left hand: Normal.     Right lower leg: No edema.     Left lower leg: No edema.     Comments: Patient having severe flank pain left when I arrived in clinic post vomiting lying on floor and trying to stretch find  position of comfort alternating from back to stomach/side and on hands/knees or sitting, standing holding onto back of chair; mild left CVA tenderness with percussion  Lymphadenopathy:     Head:     Right side of head: No submental, submandibular, tonsillar, preauricular, posterior auricular or occipital adenopathy.     Left side of head: No submental, submandibular, tonsillar, preauricular, posterior auricular or occipital adenopathy.     Cervical: No cervical adenopathy.     Right cervical: No superficial, deep or posterior cervical adenopathy.    Left cervical: No superficial, deep or posterior cervical adenopathy.  Skin:    General: Skin is warm and dry.     Capillary Refill: Capillary refill takes less than 2 seconds.     Coloration: Skin is not ashen, cyanotic, jaundiced, mottled, pale or sallow.     Findings: No abrasion, bruising, burn, ecchymosis, erythema, laceration, lesion, petechiae or rash.     Nails: There is no clubbing.   Neurological:     General: No focal deficit present.     Mental Status: She is alert and oriented to person, place, and time. She is not disoriented.     GCS: GCS eye subscore is 4. GCS verbal subscore is 5. GCS motor subscore is 6.     Cranial Nerves: Cranial nerves are intact. No cranial nerve deficit, dysarthria or facial asymmetry.     Sensory: Sensation is intact. No sensory deficit.     Motor: Motor function is intact. No weakness, tremor, atrophy, abnormal muscle tone or seizure activity.     Coordination: Coordination is intact. Coordination normal.     Gait: Gait is intact. Gait normal.     Comments: Gait sure and steady in hallway; bilateral al hand grasp equal 5/5 bilatreally  Psychiatric:        Attention and Perception: Attention and perception normal.        Mood and Affect: Mood is anxious. Affect is tearful.        Speech: Speech is rapid and pressured.        Behavior: Behavior normal. Behavior is cooperative.        Thought Content:  Thought content normal.        Cognition and Memory: Cognition  and memory normal.        Judgment: Judgment normal.           Assessment & Plan:  A-left flank pain, situational anxiety  P-patient refused ambulance transport to ED.  Wanted coworker to drive her but they could not leave at this time without drawing attention to her and she did not want coworkers in her business.  Patient teary unable to find position of comfort.  Could not provide urine sample for urinalysis in clinic. Patient was able to sip water prior to discharge without throwing up.  Some nausea and given emesis bag for her drive to hospital.  Stable ambulatory on discharge from clinic with continued flank pain but vomiting had stopped for 30 minutes.   Discussed with patient her symptoms similar to kidney stone presentation recommended further evaluation and treatment at ER.  [facility]  Report called by RN Rolly SalterHaley to charge nurse Misty StanleyStacey.  Discussed with patient ambulance would have injectable/IV meds and she refused.  We only have po tylenol/motrin in clinic and patient vomited motrin tabs within 5 minutes of ingestion from [employer] prior to my arrival.  We also do not have antiemetics in our clinic which could be given by ambulance or ER also.  I recommended further testing lab and imaging at ED patient agreed and RN Rolly SalterHaley will follow up with patient tomorrow.  Exitcare handout kidney stones, flank pain.  Patient verbalized understanding information/instructions, agreed with plan of care and had no further questions at this time.  Patient anxious no family nearby to go with her to ED and concerned about paying bill/caring for herself at home.  And having people talking about her at work/having to leave early today.  Patient called her friend and spoke to her prior to leaving and friend going to visit her after work today at hospital or home.  Patient felt better knowing someone would check on her  later today.  Denied HI/SI.

## 2018-03-18 NOTE — Patient Instructions (Signed)
Vomiting, Adult Vomiting occurs when stomach contents are thrown up and out of the mouth. Many people notice nausea before vomiting. Vomiting can make you feel weak and cause you to become dehydrated. Dehydration can make you feel tired and thirsty, cause you to have a dry mouth, and decrease how often you urinate. Older adults and people who have other diseases or a weak body defense system (immune system) are at higher risk for dehydration. It is important to treat vomiting as told by your health care provider. Follow these instructions at home:  Eating and drinking     Follow these recommendations as told by your health care provider:  Take an oral rehydration solution (ORS). This is a drink that is sold at pharmacies and retail stores.  Eat bland, easy-to-digest foods in small amounts as you are able. These foods include bananas, applesauce, rice, lean meats, toast, and crackers.  Drink clear fluids slowly and in small amounts as you are able. Clear fluids include water, ice chips, low-calorie sports drinks, and fruit juice that has water added (diluted fruit juice).  Avoid drinking fluids that contain a lot of sugar or caffeine, such as energy drinks, sports drinks, and soda.  Avoid alcohol.  Avoid spicy or fatty foods.  General instructions  Wash your hands often using soap and water. If soap and water are not available, use hand sanitizer. Make sure that everyone in your household washes their hands frequently.  Take over-the-counter and prescription medicines only as told by your health care provider.  Rest at home while you recover.  Watch your condition for any changes.  Keep all follow-up visits as told by your health care provider. This is important. Contact a health care provider if:  Your vomiting gets worse.  You have new symptoms.  You have a fever.  You cannot drink fluids without vomiting.  You feel light-headed or dizzy.  You have a headache.  You  have muscle cramps.  You have a rash.  You have pain while urinating. Get help right away if:  You have pain in your chest, neck, arm, or jaw.  You feel extremely weak or you faint.  You have persistent vomiting.  You have vomit that is bright red or looks like black coffee grounds.  You have stools that are bloody or black, or stools that look like tar.  You have a severe headache, a stiff neck, or both.  You have severe pain, cramping, or bloating in your abdomen.  You have trouble breathing or you are breathing very quickly.  Your heart is beating very quickly.  Your skin feels cold and clammy.  You feel confused.  You have signs of dehydration, such as: ? Dark urine, very little urine, or no urine. ? Cracked lips. ? Dry mouth. ? Sunken eyes. ? Sleepiness. ? Weakness. These symptoms may represent a serious problem that is an emergency. Do not wait to see if the symptoms will go away. Get medical help right away. Call your local emergency services (911 in the [place] Do not drive yourself to the hospital. Summary  Vomiting occurs when stomach contents are thrown up and out of the mouth. Vomiting can cause you to become dehydrated. Older adults and people who have other diseases or a weak immune system are at higher risk for dehydration.  It is important to treat vomiting as told by your health care provider. Follow your health care provider's instructions about eating and drinking.  Wash your hands often using   soap and water. If soap and water are not available, use hand sanitizer. Make sure that everyone in your household washes their hands frequently.  Watch your condition for any changes and for signs of dehydration.  Keep all follow-up visits as told by your health care provider. This is important. This information is not intended to replace advice given to you by your health care provider. Make sure you discuss any questions you have with your health care  provider. Document Released: 03/16/2015 Document Revised: 07/28/2017 Document Reviewed: 07/28/2017 Elsevier Interactive Patient Education  2019 [employer]. Flank Pain, Adult Flank pain is pain that is located on the side of the body between the upper abdomen and the back. This area is called the flank. The pain may occur over a short period of time (acute), or it may be long-term or recurring (chronic). It may be mild or severe. Flank pain can be caused by many things, including:  Muscle soreness or injury.  Kidney stones or kidney disease.  Stress.  A disease of the spine (vertebral disk disease).  A lung infection (pneumonia).  Fluid around the lungs (pulmonary edema).  A skin rash caused by the chickenpox virus (shingles).  Tumors that affect the back of the abdomen.  Gallbladder disease. Follow these instructions at home:   Drink enough fluid to keep your urine clear or pale yellow.  Rest as told by your health care provider.  Take over-the-counter and prescription medicines only as told by your health care provider.  Keep a journal to track what has caused your flank pain and what has made it feel better.  Keep all follow-up visits as told by your health care provider. This is important. Contact a health care provider if:  Your pain is not controlled with medicine.  You have new symptoms.  Your pain gets worse.  You have a fever.  Your symptoms last longer than 2-3 days.  You have trouble urinating or you are urinating very frequently. Get help right away if:  You have trouble breathing or you are short of breath.  Your abdomen hurts or it is swollen or red.  You have nausea or vomiting.  You feel faint or you pass out.  You have blood in your urine. Summary  Flank pain is pain that is located on the side of the body between the upper abdomen and the back.  The pain may occur over a short period of time (acute), or it may be long-term or  recurring (chronic). It may be mild or severe.  Flank pain can be caused by many things.  Contact your health care provider if your symptoms get worse or they last longer than 2-3 days. This information is not intended to replace advice given to you by your health care provider. Make sure you discuss any questions you have with your health care provider. Document Released: 04/10/2005 Document Revised: 05/02/2016 Document Reviewed: 05/02/2016 Elsevier Interactive Patient Education  2019 [employer] Inc. Kidney Stones  Kidney stones (urolithiasis) are solid, rock-like deposits that form inside of the organs that make urine (kidneys). A kidney stone may form in a kidney and move into the bladder, where it can cause intense pain and block the flow of urine. Kidney stones are created when high levels of certain minerals are found in the urine. They are usually passed through urination, but in some cases, medical treatment may be needed to remove them. What are the causes? Kidney stones may be caused by:  A condition  in which certain glands produce too much parathyroid hormone (primary hyperparathyroidism), which causes too much calcium buildup in the blood.  Buildup of uric acid crystals in the bladder (hyperuricosuria). Uric acid is a chemical that the body produces when you eat certain foods. It usually exits the body in the urine.  Narrowing (stricture) of one or both of the tubes that drain urine from the kidneys to the bladder (ureters).  A kidney blockage that is present at birth (congenital obstruction).  Past surgery on the kidney or the ureters, such as gastric bypass surgery. What increases the risk? The following factors make you more likely to develop kidney stones:  Having had a kidney stone in the past.  Having a family history of kidney stones.  Not drinking enough water.  Eating a diet that is high in protein, salt (sodium), or sugar.  Being overweight or obese. What are  the signs or symptoms? Symptoms of a kidney stone may include:  Nausea.  Vomiting.  Blood in the urine (hematuria).  Pain in the side of the abdomen, right below the ribs (flank pain). Pain usually spreads (radiates) to the groin.  Needing to urinate frequently or urgently. How is this diagnosed? This condition may be diagnosed based on:  Your medical history.  A physical exam.  Blood tests.  Urine tests.  CT scan.  Abdominal X-ray.  A procedure to examine the inside of the bladder (cystoscopy). How is this treated? Treatment for kidney stones depends on the size, location, and makeup of the stones. Treatment may involve:  Analyzing your urine before and after you pass the stone through urination.  Being monitored at the hospital until you pass the stone through urination.  Increasing your fluid intake and decreasing the amount of calcium and protein in your diet.  A procedure to break up kidney stones in the bladder using: ? A focused beam of light (laser therapy). ? Shock waves (extracorporeal shock wave lithotripsy).  Surgery to remove kidney stones. This may be needed if you have severe pain or have stones that block your urinary tract. Follow these instructions at home: Eating and drinking  Drink enough fluid to keep your urine clear or pale yellow. This will help you to pass the kidney stone.  If directed, change your diet. This may include: ? Limiting how much sodium you eat. ? Eating more fruits and vegetables. ? Limiting how much meat, poultry, fish, and eggs you eat.  Follow instructions from your health care provider about eating or drinking restrictions. General instructions  Collect urine samples as told by your health care provider. You may need to collect a urine sample: ? 24 hours after you pass the stone. ? 8-12 weeks after passing the kidney stone, and every 6-12 months after that.  Strain your urine every time you urinate, for as long as  directed. Use the strainer that your health care provider recommends.  Do not throw out the kidney stone after passing it. Keep the stone so it can be tested by your health care provider. Testing the makeup of your kidney stone may help prevent you from getting kidney stones in the future.  Take over-the-counter and prescription medicines only as told by your health care provider.  Keep all follow-up visits as told by your health care provider. This is important. You may need follow-up X-rays or ultrasounds to make sure that your stone has passed. How is this prevented? To prevent another kidney stone:  Drink enough fluid to  keep your urine clear or pale yellow. This is the best way to prevent kidney stones.  Eat a healthy diet and follow recommendations from your health care provider about foods to avoid. You may be instructed to eat a low-protein diet. Recommendations vary depending on the type of kidney stone that you have.  Maintain a healthy weight. Contact a health care provider if:  You have pain that gets worse or does not get better with medicine. Get help right away if:  You have a fever or chills.  You develop severe pain.  You develop new abdominal pain.  You faint.  You are unable to urinate. This information is not intended to replace advice given to you by your health care provider. Make sure you discuss any questions you have with your health care provider. Document Released: 02/17/2005 Document Revised: 07/31/2016 Document Reviewed: 08/03/2015 Elsevier Interactive Patient Education  2019 [employer]

## 2018-03-18 NOTE — ED Notes (Addendum)
Notified EDP,Lockwood,MD. Pt. I-stat CG4 Lactic acid results 4.65 and RN,Kiristin made aware.

## 2018-03-18 NOTE — ED Notes (Signed)
Date and time results received: 03/18/18 2213  Test: Lactic Acid Critical Value: 4.55  Name of Provider Notified: Dr. Mikeal Hawthorne messaged via [employer] Orders Received? Or Actions Taken?: see orders

## 2018-03-18 NOTE — ED Notes (Signed)
Bladder scanned patient. shown. New order for temp foley. Placed 69F temp foley. Will continue to monitor.

## 2018-03-18 NOTE — H&P (Signed)
History and Physical   Stephanie Banglicia Cumpton WUJ:[id number] DOB: 04/13/1985 DOA: 03/18/2018  Referring MD/NP/PA: Dr Jeraldine LootsLockwood  PCP: Sandford Craze'Sullivan, Melissa, NP   Outpatient Specialists: None  Patient coming from: Home  Chief Complaint: Left flank pain  HPI: Stephanie Patrick is a 33 y.o. female with medical history significant of recurrent UTI, migraine headaches, depression, ADHD, who presents with fever chills abdominal pain and left flank pain.  Pain is rated as 8 out of 10.  Persistent and has been present for 2 days.  She also has suprapubic pain associated with nausea and vomiting.  Patient has had urinary frequency and dysuria for the last 3 to 4 days.  She denied any vaginal discharge.  She denied any previous STDs.  Patient is sexually active.  Patient has noted the fever going on and off for the last 1 week.  No previous history of kidney stones.  She was seen in the ER and found to have sepsis features including left-sided hydronephrosis with 4 mm stone.  Her urinalysis is however not impressive.  Patient is therefore being admitted with sepsis most likely secondary to infected kidney stone.  ED Course: Temperature is 105 blood pressure 87/44 pulse 163 respiratory 27 oxygen sats 92% on room air.  White count is 4.6 hemoglobin 15.5 platelets 253 sodium 135 potassium 4.1 chloride 103 CO2 21 BUN 14 creatinine 0.93 calcium 9.3 and glucose 85 lactic acid of 4.65, urinalysis showed many bacteria with moderate blood but WBC only 0-5.  She has negative chest x-ray.  CT abdomen pelvis showed 4 mm stone on the left with hydronephrosis.  Patient initiated on Rocephin and being admitted.  Review of Systems: As per HPI otherwise 10 point review of systems negative.    Past Medical History:  Diagnosis Date  . ADHD 02/13/2017  . Chicken pox   . Depression   . Migraines   . Urinary tract infection     History reviewed. No pertinent surgical history.   reports that she has never smoked. She has never  used smokeless tobacco. She reports current alcohol use. She reports that she does not use drugs.  Allergies  Allergen Reactions  . Vicodin [Hydrocodone-Acetaminophen] Nausea And Vomiting    Family History  Problem Relation Age of Onset  . Cancer Maternal Grandmother   . Diabetes Maternal Grandmother   . Hypertension Maternal Grandmother      Prior to Admission medications   Medication Sig Start Date End Date Taking? Authorizing Provider  amphetamine-dextroamphetamine (ADDERALL) 5 MG tablet Take 5 mg by mouth daily.   Yes [provider]  diphenhydramine-acetaminophen (TYLENOL PM) 25-500 MG TABS tablet Take 1 tablet by mouth at bedtime as needed (sleep).   Yes [provider]  fluticasone (FLONASE) 50 MCG/ACT nasal spray Place 1 spray into both nostrils 2 (two) times daily. 03/09/18 05/08/18 Yes Betancourt, Jarold Songina A, NP  Phenylephrine HCl 5 MG TABS Take 1 tablet (5 mg total) by mouth 4 (four) times daily as needed for up to 7 days. 03/11/18 03/18/18 Yes Betancourt, Jarold Songina A, NP  predniSONE (STERAPRED UNI-PAK 21 TAB) 10 MG (21) TBPK tablet Taper take 6 pills by mouth day 1; 5 pills day 2; 4 pills day 3; 3 pills day 4; 2 pills day 5 and 1 pill day 6 with breakfast daily Patient not taking: Reported on 03/18/2018 03/11/18   Barbaraann BarthelBetancourt, Tina A, NP    Physical Exam: Vitals:   03/18/18 2030 03/18/18 2040 03/18/18 2045 03/18/18 2132  BP: 92/60  (!) 93/56 Marland Kitchen(!)  87/44  Pulse: (!) 136  (!) 133 (!) 139  Resp: (!) 22  20 (!) 23  Temp:  100.3 F (37.9 C)    TempSrc:  Axillary    SpO2: 95%  94% 93%  Weight:      Height:          Constitutional: NAD, anxious, flushed, mild distress Vitals:   03/18/18 2030 03/18/18 2040 03/18/18 2045 03/18/18 2132  BP: 92/60  (!) 93/56 (!) 87/44  Pulse: (!) 136  (!) 133 (!) 139  Resp: (!) 22  20 (!) 23  Temp:  100.3 F (37.9 C)    TempSrc:  Axillary    SpO2: 95%  94% 93%  Weight:      Height:       Eyes: PERRL, lids and conjunctivae  normal ENMT: Mucous membranes are moist. Posterior pharynx clear of any exudate or lesions.Normal dentition.  Neck: normal, supple, no masses, no thyromegaly Respiratory: clear to auscultation bilaterally, no wheezing, no crackles. Normal respiratory effort. No accessory muscle use.  Cardiovascular: Regular rate and rhythm, no murmurs / rubs / gallops. No extremity edema. 2+ pedal pulses. No carotid bruits.  Abdomen: Left flank tenderness, no masses palpated. No hepatosplenomegaly. Bowel sounds positive.  Musculoskeletal: no clubbing / cyanosis. No joint deformity upper and lower extremities. Good ROM, no contractures. Normal muscle tone.  Skin: no rashes, lesions, ulcers. No induration Neurologic: CN 2-12 grossly intact. Sensation intact, DTR normal. Strength 5/5 in all 4.  Psychiatric: Normal judgment and insight. Alert and oriented x 3. Normal mood.     Labs on Admission: I have personally reviewed following labs and imaging studies  CBC: Recent Labs  Lab 03/18/18 1343  WBC 4.6  HGB 15.5*  HCT 47.7*  MCV 90.9  PLT 253   Basic Metabolic Panel: Recent Labs  Lab 03/18/18 1343  NA 135  K 4.1  CL 103  CO2 21*  GLUCOSE 85  BUN 14  CREATININE 0.93  CALCIUM 9.3   GFR: Estimated Creatinine Clearance: 84.7 mL/min (by C-G formula based on SCr of 0.93 mg/dL). Liver Function Tests: Recent Labs  Lab 03/18/18 1343  AST 74*  ALT 78*  ALKPHOS 100  BILITOT 1.4*  PROT 8.1  ALBUMIN 4.9   Recent Labs  Lab 03/18/18 1343  LIPASE 29   No results for input(s): AMMONIA in the last 168 hours. Coagulation Profile: No results for input(s): INR, PROTIME in the last 168 hours. Cardiac Enzymes: No results for input(s): CKTOTAL, CKMB, CKMBINDEX, TROPONINI in the last 168 hours. BNP (last 3 results) No results for input(s): PROBNP in the last 8760 hours. HbA1C: No results for input(s): HGBA1C in the last 72 hours. CBG: No results for input(s): GLUCAP in the last 168 hours. Lipid  Profile: No results for input(s): CHOL, HDL, LDLCALC, TRIG, CHOLHDL, LDLDIRECT in the last 72 hours. Thyroid Function Tests: No results for input(s): TSH, T4TOTAL, FREET4, T3FREE, THYROIDAB in the last 72 hours. Anemia Panel: No results for input(s): VITAMINB12, FOLATE, FERRITIN, TIBC, IRON, RETICCTPCT in the last 72 hours. Urine analysis:    Component Value Date/Time   COLORURINE YELLOW 03/18/2018 1345   APPEARANCEUR CLEAR 03/18/2018 1345   LABSPEC 1.010 03/18/2018 1345   PHURINE 6.0 03/18/2018 1345   GLUCOSEU NEGATIVE 03/18/2018 1345   HGBUR MODERATE (A) 03/18/2018 1345   BILIRUBINUR NEGATIVE 03/18/2018 1345   BILIRUBINUR negative 01/19/2018 0930   BILIRUBINUR negative 06/12/2017 1056   KETONESUR NEGATIVE 03/18/2018 1345   PROTEINUR NEGATIVE 03/18/2018 1345  UROBILINOGEN 0.2 01/19/2018 0930   NITRITE NEGATIVE 03/18/2018 1345   LEUKOCYTESUR NEGATIVE 03/18/2018 1345   Sepsis Labs: @LABRCNTIP (procalcitonin:4,lacticidven:4) ) Recent Results (from the past 240 hour(s))  Wet prep, genital     Status: Abnormal   Collection Time: 03/18/18  3:36 PM  Result Value Ref Range Status   Yeast Wet Prep HPF POC NONE SEEN NONE SEEN Final   Trich, Wet Prep NONE SEEN NONE SEEN Final   Clue Cells Wet Prep HPF POC NONE SEEN NONE SEEN Final   WBC, Wet Prep HPF POC RARE (A) NONE SEEN Final    Comment: Specimen diluted due to transport tube containing more than 1 ml of saline, interpret results with caution.   Sperm NONE SEEN  Final    Comment: Performed at [facility], [address] [place], [place] [address]     Radiological Exams on Admission: Dg Chest 2 View  Result Date: 03/18/2018 CLINICAL DATA:  Abdominal and LEFT flank pain since this morning EXAM: CHEST - 2 VIEW COMPARISON:  None FINDINGS: Borderline enlargement of cardiac silhouette. Mediastinal contours and pulmonary vascularity normal. RIGHT basilar atelectasis and generally low lung volumes. Upper lungs clear.  No pneumothorax. Bones unremarkable. IMPRESSION: Low lung volumes with accentuated heart size and RIGHT basilar atelectasis. Electronically Signed   By: Ulyses Southward M.D.   On: 03/18/2018 20:06   Ct Abdomen Pelvis W Contrast  Result Date: 03/18/2018 CLINICAL DATA:  Patient with left flank pain. EXAM: CT ABDOMEN AND PELVIS WITH CONTRAST TECHNIQUE: Multidetector CT imaging of the abdomen and pelvis was performed using the standard protocol following bolus administration of intravenous contrast. CONTRAST:  ISOVUE-300 IOPAMIDOL (ISOVUE-300) INJECTION 61% COMPARISON:  None. FINDINGS: Lower chest: Normal heart size. Small hiatal hernia. Dependent atelectasis within the left lower lobe. No pleural effusion. Hepatobiliary: Liver is normal in size and contour. No focal hepatic lesions identified. Gallbladder is unremarkable. No intrahepatic or extrahepatic biliary ductal dilatation. Pancreas: Unremarkable Spleen: Unremarkable Adrenals/Urinary Tract: Normal adrenal glands. There is delayed enhancement of the left kidney and moderate left hydroureteronephrosis to the level of the distal left ureter were there is an obstructing 4 mm stone (image 77; series 2). Urinary bladder is unremarkable. Stomach/Bowel: Transverse and descending colon is decompressed, limiting evaluation. Small bowel feces within the distal small bowel. No free fluid or free intraperitoneal air. Vascular/Lymphatic: Normal caliber abdominal aorta. No retroperitoneal lymphadenopathy. Reproductive: Uterus is unremarkable. Adnexal structures unremarkable. Other: None. Musculoskeletal: Lumbar spine degenerative changes. No aggressive or acute appearing osseous lesions. IMPRESSION: There is an obstructing 4 mm stone within the distal left ureter resulting in mild to moderate left hydroureteronephrosis. Small bowel feces within the distal small bowel which may be secondary to slow transient. Electronically Signed   By: Annia Belt M.D.   On: 03/18/2018  18:23      Assessment/Plan Principal Problem:   Sepsis, Gram negative (HCC) Active Problems:   Recurrent UTI   Depression   Nephrolithiasis     #1 sepsis: Most likely infected kidney stones.  No other source has been found.  Patient will be admitted to stepdown unit.  Follow the sepsis protocol.  IV Rocephin.  Blood and urine cultures to be obtained and antibiotics to be adjusted.  #2 nephrolithiasis: Patient has a 4 mm stone which is obstructing with mild hydronephrosis.  We will get urology consultation.  Continue pain management.  #3 depression: Continue home regimen.  #4 recurrent UTI: Patient has had previous UTIs.  She has not had  any recently.  Urology consultation.  May have had previous stones as well.  #5 urinary retention: Patient's bladder scan showed about 500 cc of urine.  We will insert Foley catheter for now.  Await urology consultation   DVT prophylaxis: Lovenox Code Status: Full code Family Communication: Boyfriend at bedside Disposition Plan: Home Consults called: Urology  Admission status: Inpatient  Severity of Illness: The appropriate patient status for this patient is INPATIENT. Inpatient status is judged to be reasonable and necessary in order to provide the required intensity of service to ensure the patient's safety. The patient's presenting symptoms, physical exam findings, and initial radiographic and laboratory data in the context of their chronic comorbidities is felt to place them at high risk for further clinical deterioration. Furthermore, it is not anticipated that the patient will be medically stable for discharge from the hospital within 2 midnights of admission. The following factors support the patient status of inpatient.   " The patient's presenting symptoms include left flank pain and fever. " The worrisome physical exam findings include CVA tenderness on the left. " The initial radiographic and laboratory data are worrisome because of  evidence of 4 mm stones as well as sepsis. " The chronic co-morbidities include recurrent UTI.   * I certify that at the point of admission it is my clinical judgment that the patient will require inpatient hospital care spanning beyond 2 midnights from the point of admission due to high intensity of service, high risk for further deterioration and high frequency of surveillance required.Lonia Blood MD [facility] Pager [phone]  If 7PM-7AM, please contact night-coverage [email or web address] Password [facility]  03/18/2018, 9:39 PM

## 2018-03-19 ENCOUNTER — Inpatient Hospital Stay (HOSPITAL_COMMUNITY): Payer: Self-pay

## 2018-03-19 ENCOUNTER — Encounter (HOSPITAL_COMMUNITY): Payer: Self-pay | Admitting: Urology

## 2018-03-19 DIAGNOSIS — R7881 Bacteremia: Secondary | ICD-10-CM

## 2018-03-19 DIAGNOSIS — J9601 Acute respiratory failure with hypoxia: Secondary | ICD-10-CM | POA: Diagnosis not present

## 2018-03-19 DIAGNOSIS — A4150 Gram-negative sepsis, unspecified: Secondary | ICD-10-CM

## 2018-03-19 DIAGNOSIS — J9602 Acute respiratory failure with hypercapnia: Secondary | ICD-10-CM

## 2018-03-19 DIAGNOSIS — N1330 Unspecified hydronephrosis: Secondary | ICD-10-CM

## 2018-03-19 HISTORY — DX: Unspecified hydronephrosis: N13.30

## 2018-03-19 LAB — BLOOD CULTURE ID PANEL (REFLEXED)
Acinetobacter baumannii: NOT DETECTED
CANDIDA GLABRATA: NOT DETECTED
Candida albicans: NOT DETECTED
Candida krusei: NOT DETECTED
Candida parapsilosis: NOT DETECTED
Candida tropicalis: NOT DETECTED
Carbapenem resistance: NOT DETECTED
ENTEROBACTERIACEAE SPECIES: DETECTED — AB
Enterobacter cloacae complex: NOT DETECTED
Enterococcus species: NOT DETECTED
Escherichia coli: DETECTED — AB
Haemophilus influenzae: NOT DETECTED
Klebsiella oxytoca: NOT DETECTED
Klebsiella pneumoniae: NOT DETECTED
Listeria monocytogenes: NOT DETECTED
Neisseria meningitidis: NOT DETECTED
Proteus species: NOT DETECTED
Pseudomonas aeruginosa: NOT DETECTED
STREPTOCOCCUS PYOGENES: NOT DETECTED
Serratia marcescens: NOT DETECTED
Staphylococcus aureus (BCID): NOT DETECTED
Staphylococcus species: NOT DETECTED
Streptococcus agalactiae: NOT DETECTED
Streptococcus pneumoniae: NOT DETECTED
Streptococcus species: NOT DETECTED

## 2018-03-19 LAB — BLOOD GAS, ARTERIAL
Acid-base deficit: 10.9 mmol/L — ABNORMAL HIGH (ref 0.0–2.0)
Acid-base deficit: 10.1 mmol/L — ABNORMAL HIGH (ref 0.0–2.0)
Acid-base deficit: 6.3 mmol/L — ABNORMAL HIGH (ref 0.0–2.0)
Acid-base deficit: 7.1 mmol/L — ABNORMAL HIGH (ref 0.0–2.0)
Acid-base deficit: 6.1 mmol/L — ABNORMAL HIGH (ref 0.0–2.0)
Bicarbonate: 14.7 mmol/L — ABNORMAL LOW (ref 20.0–28.0)
Bicarbonate: 16.9 mmol/L — ABNORMAL LOW (ref 20.0–28.0)
Bicarbonate: 19.3 mmol/L — ABNORMAL LOW (ref 20.0–28.0)
Bicarbonate: 18.9 mmol/L — ABNORMAL LOW (ref 20.0–28.0)
Bicarbonate: 19.3 mmol/L — ABNORMAL LOW (ref 20.0–28.0)
DRAWN BY: 422461
Drawn by: 232811
Drawn by: 422461
Drawn by: 331471
Drawn by: 331471
FIO2: 100
FIO2: 100
FIO2: 70
FIO2: 70
MECHVT: 320 mL
MECHVT: 400 mL
MECHVT: 400 mL
MECHVT: 400 mL
O2 CONTENT: 15 L/min
O2 Saturation: 82.9 %
O2 Saturation: 91.9 %
O2 Saturation: 95.1 %
O2 Saturation: 97.4 %
O2 Saturation: 98.5 %
PATIENT TEMPERATURE: 99.3
PATIENT TEMPERATURE: 37
PEEP: 12 cmH2O
PEEP: 10 cmH2O
PEEP: 5 cmH2O
PEEP: 5 cmH2O
Patient temperature: 37.8
Patient temperature: 37.7
Patient temperature: 37
RATE: 24 resp/min
RATE: 20 resp/min
RATE: 20 resp/min
RATE: 24 resp/min
pCO2 arterial: 34.1 mmHg (ref 32.0–48.0)
pCO2 arterial: 45.6 mmHg (ref 32.0–48.0)
pCO2 arterial: 42.3 mmHg (ref 32.0–48.0)
pCO2 arterial: 42.2 mmHg (ref 32.0–48.0)
pCO2 arterial: 40.1 mmHg (ref 32.0–48.0)
pH, Arterial: 7.26 — ABNORMAL LOW (ref 7.350–7.450)
pH, Arterial: 7.2 — ABNORMAL LOW (ref 7.350–7.450)
pH, Arterial: 7.286 — ABNORMAL LOW (ref 7.350–7.450)
pH, Arterial: 7.274 — ABNORMAL LOW (ref 7.350–7.450)
pH, Arterial: 7.304 — ABNORMAL LOW (ref 7.350–7.450)
pO2, Arterial: 56.4 mmHg — ABNORMAL LOW (ref 83.0–108.0)
pO2, Arterial: 79.7 mmHg — ABNORMAL LOW (ref 83.0–108.0)
pO2, Arterial: 86.8 mmHg (ref 83.0–108.0)
pO2, Arterial: 107 mmHg (ref 83.0–108.0)
pO2, Arterial: 135 mmHg — ABNORMAL HIGH (ref 83.0–108.0)

## 2018-03-19 LAB — GLUCOSE, CAPILLARY
GLUCOSE-CAPILLARY: 114 mg/dL — AB (ref 70–99)
Glucose-Capillary: 122 mg/dL — ABNORMAL HIGH (ref 70–99)
Glucose-Capillary: 127 mg/dL — ABNORMAL HIGH (ref 70–99)
Glucose-Capillary: 132 mg/dL — ABNORMAL HIGH (ref 70–99)

## 2018-03-19 LAB — ECHOCARDIOGRAM COMPLETE
Height: 62 in
Weight: 2800 oz

## 2018-03-19 LAB — CBC
HCT: 36.1 % (ref 36.0–46.0)
Hemoglobin: 11.7 g/dL — ABNORMAL LOW (ref 12.0–15.0)
MCH: 29.9 pg (ref 26.0–34.0)
MCHC: 32.4 g/dL (ref 30.0–36.0)
MCV: 92.3 fL (ref 80.0–100.0)
Platelets: 95 10*3/uL — ABNORMAL LOW (ref 150–400)
RBC: 3.91 MIL/uL (ref 3.87–5.11)
RDW: 13.4 % (ref 11.5–15.5)
WBC: 27.2 10*3/uL — AB (ref 4.0–10.5)
nRBC: 0 % (ref 0.0–0.2)

## 2018-03-19 LAB — COMPREHENSIVE METABOLIC PANEL
ALT: 39 U/L (ref 0–44)
AST: 43 U/L — ABNORMAL HIGH (ref 15–41)
Albumin: 2.7 g/dL — ABNORMAL LOW (ref 3.5–5.0)
Alkaline Phosphatase: 52 U/L (ref 38–126)
Anion gap: 11 (ref 5–15)
BUN: 12 mg/dL (ref 6–20)
CO2: 15 mmol/L — ABNORMAL LOW (ref 22–32)
CREATININE: 1.07 mg/dL — AB (ref 0.44–1.00)
Calcium: 5.5 mg/dL — CL (ref 8.9–10.3)
Chloride: 113 mmol/L — ABNORMAL HIGH (ref 98–111)
GFR calc Af Amer: >60 mL/min (ref >60)
GFR calc non Af Amer: >60 mL/min (ref >60)
Glucose, Bld: 143 mg/dL — ABNORMAL HIGH (ref 70–99)
Potassium: 3.8 mmol/L (ref 3.5–5.1)
Sodium: 139 mmol/L (ref 135–145)
TOTAL PROTEIN: 4.5 g/dL — AB (ref 6.5–8.1)
Total Bilirubin: 1.9 mg/dL — ABNORMAL HIGH (ref 0.3–1.2)

## 2018-03-19 LAB — MAGNESIUM: Magnesium: 1.4 mg/dL — ABNORMAL LOW (ref 1.7–2.4)

## 2018-03-19 LAB — LACTIC ACID, PLASMA
LACTIC ACID, VENOUS: 4.2 mmol/L — AB (ref 0.5–1.9)
Lactic Acid, Venous: 2.3 mmol/L (ref 0.5–1.9)
Lactic Acid, Venous: 3.1 mmol/L (ref 0.5–1.9)

## 2018-03-19 LAB — PROCALCITONIN: Procalcitonin: 88.13 ng/mL

## 2018-03-19 LAB — RAPID URINE DRUG SCREEN, HOSP PERFORMED
Amphetamines: POSITIVE — AB
Barbiturates: NOT DETECTED
Benzodiazepines: POSITIVE — AB
Cocaine: NOT DETECTED
Opiates: POSITIVE — AB
TETRAHYDROCANNABINOL: NOT DETECTED

## 2018-03-19 LAB — PROTIME-INR
INR: 1.59
Prothrombin Time: 18.8 seconds — ABNORMAL HIGH (ref 11.4–15.2)

## 2018-03-19 LAB — COOXEMETRY PANEL
Carboxyhemoglobin: 0.6 % (ref 0.5–1.5)
Methemoglobin: 0.8 % (ref 0.0–1.5)
O2 SAT: 74.9 %
Total hemoglobin: 10.6 g/dL — ABNORMAL LOW (ref 12.0–16.0)

## 2018-03-19 LAB — SURGICAL PCR SCREEN
MRSA, PCR: POSITIVE — AB
Staphylococcus aureus: POSITIVE — AB

## 2018-03-19 LAB — PHOSPHORUS: Phosphorus: 3.5 mg/dL (ref 2.5–4.6)

## 2018-03-19 LAB — CORTISOL-AM, BLOOD: Cortisol - AM: 17.5 ug/dL (ref 6.7–22.6)

## 2018-03-19 LAB — GC/CHLAMYDIA PROBE AMP (~~LOC~~) NOT AT ARMC
Chlamydia: NEGATIVE
Neisseria Gonorrhea: NEGATIVE

## 2018-03-19 LAB — CORTISOL: Cortisol, Plasma: 17.5 ug/dL

## 2018-03-19 MED ORDER — ROCURONIUM BROMIDE 100 MG/10ML IV SOLN
INTRAVENOUS | Status: AC
  Filled 2018-03-19: qty 1

## 2018-03-19 MED ORDER — VITAL HIGH PROTEIN PO LIQD
1000.0000 mL | ORAL | Status: DC
  Administered 2018-03-19: 1000 mL

## 2018-03-19 MED ORDER — SODIUM BICARBONATE 8.4 % IV SOLN
INTRAVENOUS | Status: AC
  Filled 2018-03-19: qty 50

## 2018-03-19 MED ORDER — SODIUM CHLORIDE 0.9 % IV SOLN
2.0000 g | Freq: Once | INTRAVENOUS | Status: AC
  Administered 2018-03-19: 2 g via INTRAVENOUS
  Filled 2018-03-19: qty 20

## 2018-03-19 MED ORDER — DEXMEDETOMIDINE BOLUS VIA INFUSION
0.7000 ug/kg | Freq: Once | INTRAVENOUS | Status: DC
  Filled 2018-03-19: qty 56

## 2018-03-19 MED ORDER — PHENYLEPHRINE 40 MCG/ML (10ML) SYRINGE FOR IV PUSH (FOR BLOOD PRESSURE SUPPORT)
PREFILLED_SYRINGE | INTRAVENOUS | Status: AC
  Filled 2018-03-19: qty 10

## 2018-03-19 MED ORDER — FENTANYL 2500MCG IN NS 250ML (10MCG/ML) PREMIX INFUSION
25.0000 ug/h | INTRAVENOUS | Status: DC
  Administered 2018-03-19: 50 ug/h via INTRAVENOUS
  Administered 2018-03-19 – 2018-03-20 (×2): 200 ug/h via INTRAVENOUS
  Filled 2018-03-19 (×3): qty 250

## 2018-03-19 MED ORDER — PHENYLEPHRINE HCL 10 MG/ML IJ SOLN
INTRAMUSCULAR | Status: DC | PRN
  Administered 2018-03-18 (×3): 120 ug via INTRAVENOUS
  Administered 2018-03-18: 40 ug via INTRAVENOUS

## 2018-03-19 MED ORDER — GENTAMICIN IN SALINE 1.6-0.9 MG/ML-% IV SOLN
80.0000 mg | Freq: Once | INTRAVENOUS | Status: DC
  Filled 2018-03-19: qty 50

## 2018-03-19 MED ORDER — FLUTICASONE PROPIONATE 50 MCG/ACT NA SUSP
1.0000 | Freq: Two times a day (BID) | NASAL | Status: DC
  Administered 2018-03-21 – 2018-03-24 (×7): 1 via NASAL
  Filled 2018-03-19 (×2): qty 16

## 2018-03-19 MED ORDER — ETOMIDATE 2 MG/ML IV SOLN
INTRAVENOUS | Status: AC
  Filled 2018-03-19: qty 10

## 2018-03-19 MED ORDER — HYDROCORTISONE NA SUCCINATE PF 100 MG IJ SOLR
50.0000 mg | Freq: Four times a day (QID) | INTRAMUSCULAR | Status: DC
  Administered 2018-03-19 – 2018-03-21 (×8): 50 mg via INTRAVENOUS
  Filled 2018-03-19 (×8): qty 2

## 2018-03-19 MED ORDER — LIDOCAINE 2% (20 MG/ML) 5 ML SYRINGE
INTRAMUSCULAR | Status: AC
  Filled 2018-03-19: qty 5

## 2018-03-19 MED ORDER — FENTANYL CITRATE (PF) 100 MCG/2ML IJ SOLN: 25.0000 ug | INTRAMUSCULAR | Status: DC | PRN

## 2018-03-19 MED ORDER — SUCCINYLCHOLINE CHLORIDE 200 MG/10ML IV SOSY
PREFILLED_SYRINGE | INTRAVENOUS | Status: DC | PRN
  Administered 2018-03-18: 120 mg via INTRAVENOUS

## 2018-03-19 MED ORDER — SODIUM CHLORIDE 0.9 % IV SOLN
2.0000 g | INTRAVENOUS | Status: DC
  Administered 2018-03-19 – 2018-03-27 (×9): 2 g via INTRAVENOUS
  Filled 2018-03-19: qty 2
  Filled 2018-03-19: qty 20
  Filled 2018-03-19: qty 2
  Filled 2018-03-19: qty 20
  Filled 2018-03-19 (×5): qty 2

## 2018-03-19 MED ORDER — SODIUM BICARBONATE 8.4 % IV SOLN
100.0000 meq | Freq: Once | INTRAVENOUS | Status: AC
  Administered 2018-03-19: 100 meq via INTRAVENOUS

## 2018-03-19 MED ORDER — ONDANSETRON HCL 4 MG/2ML IJ SOLN
4.0000 mg | Freq: Four times a day (QID) | INTRAMUSCULAR | Status: DC | PRN
  Administered 2018-03-20 – 2018-03-25 (×13): 4 mg via INTRAVENOUS
  Filled 2018-03-19 (×13): qty 2

## 2018-03-19 MED ORDER — SUCCINYLCHOLINE CHLORIDE 200 MG/10ML IV SOSY
PREFILLED_SYRINGE | INTRAVENOUS | Status: AC
  Filled 2018-03-19: qty 10

## 2018-03-19 MED ORDER — SODIUM CHLORIDE 0.9 % IV SOLN
INTRAVENOUS | Status: DC | PRN
  Administered 2018-03-18: 75 ug/min via INTRAVENOUS

## 2018-03-19 MED ORDER — SODIUM CHLORIDE 0.9 % IV SOLN
INTRAVENOUS | Status: DC | PRN
  Administered 2018-03-19: 250 mL via INTRAVENOUS
  Administered 2018-03-20: 1000 mL via INTRAVENOUS
  Administered 2018-03-25: 250 mL via INTRAVENOUS

## 2018-03-19 MED ORDER — ACETAMINOPHEN 160 MG/5ML PO SOLN
650.0000 mg | Freq: Four times a day (QID) | ORAL | Status: DC | PRN
  Administered 2018-03-21: 650 mg
  Filled 2018-03-19: qty 20.3

## 2018-03-19 MED ORDER — ONDANSETRON HCL 4 MG/2ML IJ SOLN: 4.0000 mg | Freq: Once | INTRAMUSCULAR | Status: DC | PRN

## 2018-03-19 MED ORDER — NOREPINEPHRINE-SODIUM CHLORIDE 4-0.9 MG/250ML-% IV SOLN
0.0000 ug/min | INTRAVENOUS | Status: DC
  Administered 2018-03-19: 10 ug/min via INTRAVENOUS
  Administered 2018-03-19: 30 ug/min via INTRAVENOUS
  Administered 2018-03-19: 20 ug/min via INTRAVENOUS
  Administered 2018-03-19: 30 ug/min via INTRAVENOUS
  Administered 2018-03-19: 20 ug/min via INTRAVENOUS
  Administered 2018-03-20: 2 ug/min via INTRAVENOUS
  Administered 2018-03-20: 10 ug/min via INTRAVENOUS
  Filled 2018-03-19 (×3): qty 250
  Filled 2018-03-19: qty 500
  Filled 2018-03-19: qty 250

## 2018-03-19 MED ORDER — CHLORHEXIDINE GLUCONATE CLOTH 2 % EX PADS
6.0000 | MEDICATED_PAD | Freq: Every day | CUTANEOUS | Status: DC
  Administered 2018-03-19 – 2018-03-21 (×3): 6 via TOPICAL

## 2018-03-19 MED ORDER — PHENYLEPHRINE HCL 10 MG/ML IJ SOLN
INTRAMUSCULAR | Status: AC
  Filled 2018-03-19: qty 1

## 2018-03-19 MED ORDER — PHENYLEPHRINE HCL-NACL 10-0.9 MG/250ML-% IV SOLN
INTRAVENOUS | Status: AC
  Administered 2018-03-19: 66.67 ug/min via INTRAVENOUS
  Filled 2018-03-19: qty 250

## 2018-03-19 MED ORDER — SODIUM CHLORIDE 0.9 % IV SOLN
INTRAVENOUS | Status: DC
  Administered 2018-03-19: 04:00:00 via INTRAVENOUS

## 2018-03-19 MED ORDER — SODIUM CHLORIDE 0.9 % IV SOLN
INTRAVENOUS | Status: DC | PRN
  Administered 2018-03-18: via INTRAVENOUS

## 2018-03-19 MED ORDER — ONDANSETRON HCL 4 MG/2ML IJ SOLN
INTRAMUSCULAR | Status: DC | PRN
  Administered 2018-03-19: 4 mg via INTRAVENOUS

## 2018-03-19 MED ORDER — FUROSEMIDE 10 MG/ML IJ SOLN
INTRAMUSCULAR | Status: AC
  Administered 2018-03-19: 20 mg
  Filled 2018-03-19: qty 2

## 2018-03-19 MED ORDER — AMPHETAMINE-DEXTROAMPHETAMINE 10 MG PO TABS
5.0000 mg | ORAL_TABLET | Freq: Every day | ORAL | Status: DC
  Filled 2018-03-19 (×5): qty 1

## 2018-03-19 MED ORDER — PRO-STAT SUGAR FREE PO LIQD
30.0000 mL | Freq: Two times a day (BID) | ORAL | Status: DC
  Administered 2018-03-19: 30 mL
  Filled 2018-03-19: qty 30

## 2018-03-19 MED ORDER — DEXAMETHASONE SODIUM PHOSPHATE 10 MG/ML IJ SOLN
INTRAMUSCULAR | Status: DC | PRN
  Administered 2018-03-19: 5 mg via INTRAVENOUS

## 2018-03-19 MED ORDER — ALBUMIN HUMAN 5 % IV SOLN
INTRAVENOUS | Status: DC | PRN
  Administered 2018-03-18 (×2): via INTRAVENOUS

## 2018-03-19 MED ORDER — ETOMIDATE 2 MG/ML IV SOLN
16.0000 mg | Freq: Once | INTRAVENOUS | Status: AC
  Administered 2018-03-19: 16 mg via INTRAVENOUS
  Filled 2018-03-19: qty 8

## 2018-03-19 MED ORDER — SODIUM CHLORIDE 0.9 % IV SOLN: 1.0000 g | INTRAVENOUS | Status: DC

## 2018-03-19 MED ORDER — DEXMEDETOMIDINE HCL IN NACL 400 MCG/100ML IV SOLN
0.4000 ug/kg/h | INTRAVENOUS | Status: DC
  Administered 2018-03-19: 0.7 ug/kg/h via INTRAVENOUS
  Filled 2018-03-19: qty 100

## 2018-03-19 MED ORDER — MIDAZOLAM HCL 2 MG/2ML IJ SOLN
INTRAMUSCULAR | Status: AC
  Filled 2018-03-19: qty 2

## 2018-03-19 MED ORDER — MAGNESIUM SULFATE 2 GM/50ML IV SOLN
2.0000 g | Freq: Once | INTRAVENOUS | Status: AC
  Administered 2018-03-19: 2 g via INTRAVENOUS
  Filled 2018-03-19: qty 50

## 2018-03-19 MED ORDER — FENTANYL BOLUS VIA INFUSION
50.0000 ug | INTRAVENOUS | Status: DC | PRN
  Administered 2018-03-19 (×2): 50 ug via INTRAVENOUS
  Filled 2018-03-19: qty 50

## 2018-03-19 MED ORDER — FENTANYL CITRATE (PF) 100 MCG/2ML IJ SOLN
100.0000 ug | Freq: Once | INTRAMUSCULAR | Status: AC
  Administered 2018-03-19: 100 ug via INTRAVENOUS

## 2018-03-19 MED ORDER — SODIUM BICARBONATE 8.4 % IV SOLN
INTRAVENOUS | Status: DC
  Administered 2018-03-19 – 2018-03-20 (×3): via INTRAVENOUS
  Filled 2018-03-19 (×3): qty 150

## 2018-03-19 MED ORDER — NOREPINEPHRINE BITARTRATE 1 MG/ML IV SOLN
0.0000 ug/min | INTRAVENOUS | Status: DC
  Administered 2018-03-19: 11 ug/min via INTRAVENOUS
  Filled 2018-03-19 (×2): qty 4

## 2018-03-19 MED ORDER — MORPHINE SULFATE (PF) 2 MG/ML IV SOLN
2.0000 mg | INTRAVENOUS | Status: DC | PRN
  Administered 2018-03-20: 2 mg via INTRAVENOUS
  Filled 2018-03-19: qty 1

## 2018-03-19 MED ORDER — SODIUM CHLORIDE 0.9 % IV BOLUS (SEPSIS): 500.0000 mL | Freq: Once | INTRAVENOUS | Status: DC

## 2018-03-19 MED ORDER — MIDAZOLAM HCL 2 MG/2ML IJ SOLN
2.0000 mg | Freq: Once | INTRAMUSCULAR | Status: AC
  Administered 2018-03-19: 2 mg via INTRAVENOUS

## 2018-03-19 MED ORDER — PERFLUTREN LIPID MICROSPHERE
1.0000 mL | INTRAVENOUS | Status: AC | PRN
  Administered 2018-03-19: 2 mL via INTRAVENOUS
  Filled 2018-03-19: qty 10

## 2018-03-19 MED ORDER — VASOPRESSIN 20 UNIT/ML IV SOLN
0.0300 [IU]/min | INTRAVENOUS | Status: DC
  Administered 2018-03-19: 0.03 [IU]/min via INTRAVENOUS
  Filled 2018-03-19: qty 2

## 2018-03-19 MED ORDER — SODIUM CHLORIDE 0.9 % IV SOLN
INTRAVENOUS | Status: DC | PRN
  Administered 2018-03-18 – 2018-03-19 (×2): via INTRAVENOUS

## 2018-03-19 MED ORDER — ENOXAPARIN SODIUM 40 MG/0.4ML ~~LOC~~ SOLN
40.0000 mg | Freq: Every day | SUBCUTANEOUS | Status: DC
  Administered 2018-03-19 – 2018-03-27 (×6): 40 mg via SUBCUTANEOUS
  Filled 2018-03-19 (×8): qty 0.4

## 2018-03-19 MED ORDER — PHENYLEPHRINE HCL-NACL 10-0.9 MG/250ML-% IV SOLN
0.0000 ug/min | INTRAVENOUS | Status: DC
  Administered 2018-03-19: 66.67 ug/min via INTRAVENOUS

## 2018-03-19 MED ORDER — MIDAZOLAM HCL 2 MG/2ML IJ SOLN
2.0000 mg | INTRAMUSCULAR | Status: DC | PRN
  Administered 2018-03-19: 2 mg via INTRAVENOUS
  Filled 2018-03-19 (×2): qty 2

## 2018-03-19 MED ORDER — FENTANYL CITRATE (PF) 100 MCG/2ML IJ SOLN: 50.0000 ug | Freq: Once | INTRAMUSCULAR | Status: DC

## 2018-03-19 MED ORDER — FUROSEMIDE 10 MG/ML IJ SOLN
20.0000 mg | Freq: Once | INTRAMUSCULAR | Status: AC
  Administered 2018-03-19: 20 mg via INTRAVENOUS

## 2018-03-19 MED ORDER — PHENYLEPHRINE HCL 10 MG/ML IJ SOLN
66.6700 ug/kg | Freq: Once | INTRAMUSCULAR | Status: DC
  Filled 2018-03-19: qty 0.53

## 2018-03-19 MED ORDER — ORAL CARE MOUTH RINSE
15.0000 mL | OROMUCOSAL | Status: DC
  Administered 2018-03-19 – 2018-03-20 (×14): 15 mL via OROMUCOSAL

## 2018-03-19 MED ORDER — MIDAZOLAM HCL 2 MG/2ML IJ SOLN
2.0000 mg | INTRAMUSCULAR | Status: DC | PRN
  Administered 2018-03-19 – 2018-03-20 (×4): 2 mg via INTRAVENOUS
  Filled 2018-03-19 (×3): qty 2

## 2018-03-19 MED ORDER — SODIUM CHLORIDE 0.9 % IV BOLUS (SEPSIS): 1000.0000 mL | Freq: Once | INTRAVENOUS | Status: DC

## 2018-03-19 MED ORDER — MUPIROCIN 2 % EX OINT
1.0000 "application " | TOPICAL_OINTMENT | Freq: Two times a day (BID) | CUTANEOUS | Status: AC
  Administered 2018-03-19 – 2018-03-23 (×10): 1 via NASAL
  Filled 2018-03-19: qty 22

## 2018-03-19 MED ORDER — DIPHENHYDRAMINE HCL 25 MG PO CAPS: 25.0000 mg | ORAL_CAPSULE | Freq: Every evening | ORAL | Status: DC | PRN

## 2018-03-19 MED ORDER — SODIUM BICARBONATE 8.4 % IV SOLN
100.0000 meq | Freq: Once | INTRAVENOUS | Status: AC
  Administered 2018-03-19: 100 meq via INTRAVENOUS
  Filled 2018-03-19: qty 100

## 2018-03-19 MED ORDER — CHLORHEXIDINE GLUCONATE 0.12% ORAL RINSE (MEDLINE KIT)
15.0000 mL | Freq: Two times a day (BID) | OROMUCOSAL | Status: DC
  Administered 2018-03-19 – 2018-03-20 (×3): 15 mL via OROMUCOSAL

## 2018-03-19 MED ORDER — SODIUM BICARBONATE 8.4 % IV SOLN
INTRAVENOUS | Status: AC
  Filled 2018-03-19: qty 100

## 2018-03-19 MED ORDER — GENTAMICIN SULFATE 40 MG/ML IJ SOLN
400.0000 mg | INTRAVENOUS | Status: DC
  Administered 2018-03-19 – 2018-03-20 (×2): 400 mg via INTRAVENOUS
  Filled 2018-03-19 (×3): qty 10

## 2018-03-19 MED ORDER — ONDANSETRON HCL 4 MG PO TABS
4.0000 mg | ORAL_TABLET | Freq: Four times a day (QID) | ORAL | Status: DC | PRN
  Administered 2018-03-25 – 2018-03-27 (×4): 4 mg via ORAL
  Filled 2018-03-19 (×5): qty 1

## 2018-03-19 MED ORDER — MIDAZOLAM HCL 2 MG/2ML IJ SOLN
INTRAMUSCULAR | Status: AC
  Administered 2018-03-19: 2 mg
  Filled 2018-03-19: qty 2

## 2018-03-19 MED ORDER — NOREPINEPHRINE BITARTRATE 1 MG/ML IV SOLN
2.0000 ug/min | INTRAVENOUS | Status: DC
  Administered 2018-03-19: 5.333 ug/min via INTRAVENOUS
  Administered 2018-03-19: 8 ug/min via INTRAVENOUS
  Administered 2018-03-19: 10.667 ug/min via INTRAVENOUS
  Filled 2018-03-19: qty 4

## 2018-03-19 MED ORDER — FENTANYL CITRATE (PF) 100 MCG/2ML IJ SOLN
INTRAMUSCULAR | Status: AC
  Filled 2018-03-19: qty 2

## 2018-03-19 MED ORDER — DEXMEDETOMIDINE HCL IN NACL 200 MCG/50ML IV SOLN
INTRAVENOUS | Status: AC
  Filled 2018-03-19: qty 50

## 2018-03-19 MED ORDER — DOCUSATE SODIUM 50 MG/5ML PO LIQD
100.0000 mg | Freq: Two times a day (BID) | ORAL | Status: DC | PRN
  Filled 2018-03-19: qty 10

## 2018-03-19 MED ORDER — POTASSIUM CHLORIDE 20 MEQ PO PACK
40.0000 meq | PACK | Freq: Two times a day (BID) | ORAL | Status: DC
  Administered 2018-03-19: 40 meq via ORAL
  Filled 2018-03-19: qty 2

## 2018-03-19 MED ORDER — PANTOPRAZOLE SODIUM 40 MG IV SOLR
40.0000 mg | INTRAVENOUS | Status: DC
  Administered 2018-03-19 – 2018-03-20 (×2): 40 mg via INTRAVENOUS
  Filled 2018-03-19 (×2): qty 40

## 2018-03-19 MED ORDER — SODIUM BICARBONATE 4 % IV SOLN
INTRAVENOUS | Status: AC
  Filled 2018-03-19: qty 5

## 2018-03-19 NOTE — Anesthesia Procedure Notes (Signed)
Central Venous Catheter Insertion Performed by: Kipp Brood, MD, anesthesiologist Start/End1/17/2020 12:30 AM, 03/19/2018 12:35 AM Patient location: PACU. Preanesthetic checklist: patient identified, IV checked, site marked, risks and benefits discussed, surgical consent, monitors and equipment checked, pre-op evaluation, timeout performed and anesthesia consent Position: Trendelenburg Lidocaine 1% used for infiltration and patient sedated Hand hygiene performed , maximum sterile barriers used  and Seldinger technique used Catheter size: 8 Fr Total catheter length 16. Central line was placed.Double lumen Procedure performed using ultrasound guided technique. Ultrasound Notes:anatomy identified, needle tip was noted to be adjacent to the nerve/plexus identified and no ultrasound evidence of intravascular and/or intraneural injection Attempts: 1 Following insertion, dressing applied, line sutured and Biopatch. Post procedure assessment: blood return through all ports, free fluid flow and no air  Patient tolerated the procedure well with no immediate complications. Additional procedure comments: No image printed.

## 2018-03-19 NOTE — Progress Notes (Signed)
Preparing to intubate at this time. Bagging a this time. Will continue to monitor and tx pt according to MD orders. S/O notified in the ICU waiting room.

## 2018-03-19 NOTE — Progress Notes (Addendum)
..   NAME:  Stephanie Patrick, MRN:  [id number], DOB:  1986-03-03, LOS: 1 ADMISSION DATE:  03/18/2018, CONSULTATION DATE:  03/19/2018 REFERRING MD:  , CHIEF COMPLAINT:  Acute hypoxic resp failure   Brief History   33 yr old female with PMHx sig for vit D def, recurrent UTI presents to [facility] on 1/16 with left flank pain found to have a Left ureteral stone with obstruction. PCCM consulted for ICU mgmt post Urological intervention when pt was hypoxic and hypotensive.   History of present illness   (Obtained from EMR review, reports from Rn and acct of other provider)  33 yr old female with PMHx for Depression, Vitamin D Deficiency, recurrent  UTIs resents to [facility] ED for left flank pain. 1/16  She was found to be septic with a temperature of 105 and tachycardia.  Her urine showed many bacteria, 0-5 whites and 0-5 reds.  White count and creatinine normal.  CT scan revealed a 5 mm distal left ureteral stone but there was proximal mild - moderate hydronephrosis and decreased enhancement from the left kidney consistent with high-grade obstruction.  The left kidney may also have some edema. A foley was placed for a bladder scan ~ 500 and pt couldn't void with left flank pain found to have a Left ureteral stone with obstruction. Pt received a Cystoscopy with Retrograde Pyelogramm/ Left Uretral Stent Placement post op pt required pressors and increased PEEP due to hypotension and hypoxia.   Past Medical History  .Marland Kitchen Active Ambulatory Problems    Diagnosis Date Noted  . Recurrent UTI 02/13/2017  . Depression 02/13/2017  . ADHD 02/13/2017  . Migraines 02/16/2017  . Vitamin D deficiency 02/16/2017  . Preventative health care 02/16/2017  . Insomnia 02/16/2017   Resolved Ambulatory Problems    Diagnosis Date Noted  . No Resolved Ambulatory Problems   Past Medical History:  Diagnosis Date  . Chicken pox   . Urinary tract infection     Overnight changes: FiO2 is weaned down to 70%.  She was 100%.  Awake  alert following commands.  Currently on norepinephrine at 30 mics.  Stents in place.  Consults:  Urology 03/18/2018 PCCM 03/19/2018  Procedures:  Endotracheally intubated 03/19/2018 Arterial line right brachial 03/19/2018 Cystoscopy with Retrograde Pyelogram/ Left Ureteral Stent Placement 03/19/2018 1/17 2 d >> Significant Diagnostic Tests:    Micro Data:  MRSA PCR positive1/16/2020 Blood cx x 2 gram-negative rods, E. coli  Antimicrobials:  Vancomycin- 03/18/2018 - 1 dose Rocephin 03/18/2018, 117 increased to 2 g IV every 24 hours>>    Objective   Blood pressure (!) 86/66, pulse (!) 112, temperature 100.2 F (37.9 C), resp. rate 18, height 5\' 2"  (1.575 m), weight 79.4 kg, last menstrual period 03/01/2018, SpO2 100 %. CVP:  [18 mmHg-26 mmHg] 18 mmHg  Vent Mode: PRVC FiO2 (%):  [80 %-100 %] 80 % Set Rate:  [20 bmp-24 bmp] 20 bmp Vt Set:  [320 mL-400 mL] 400 mL PEEP:  [8 cmH20-12 cmH20] 8 cmH20 Plateau Pressure:  [22 cmH20-27 cmH20] 22 cmH20   Intake/Output Summary (Last 24 hours) at 03/19/2018 1107 Last data filed at 03/19/2018 1000 Gross per 24 hour  Intake 7789.64 ml  Output 2100 ml  Net 5689.64 ml   Filed Weights   03/18/18 1137  Weight: 79.4 kg    Examination: General: Well-nourished well-developed female who is currently awake follows commands despite being sedated HEENT: Endotracheal tube to ventilator, orogastric tube in place Neuro: Awake alert follows commands moves all  extremities.  Mild agitation.  Currently on fentanyl drip at 175 mcg CV: s irregular regular rate and rhythm PULM: Bibasilar crackles, decreased breath sounds throughout GI: Soft, nontender to palpation.  Faint bowel sounds Extremities:  warm and dry Skin: no rashes or lesions GU: Urine with faint blood-tinged    Assessment & Plan:  1. Acute hypoxic Respiratory Failure Pulmonary Edema Metabolic acidosis  Plan: Vent bundle Wean FiO2 as able for sats greater than 94% Recheck ABGs as  needed Monitor serial chest x-rays while intubated Once hemodynamically stable will most likely need diuresis.  Note CVP is running from 18-20   2. Septic Shock  from Left uretral stone with obstruction MAP goal >89mmHg Currently on Vasopressors ( Levophed gtt) 1/17 added vasopressin.  Has a left brachial A line placed by Anesthesia Cortisol level 17.5 Procalcitonin 88.13 Plan: Norepinephrine titrate to keep systolic blood pressure greater than 90.  Currently on 30 mcg, wean as tolerated 03/19/2018 add vasopressin, meets criteria for angiotension 2, if not responsive to vasopressin, then start angiotension 2 drip per protocol.  Currently on stress steroids hydrocortisone 50 mg IV every 6 Suspect now that stent is been in place, he is adequately oxygenated, her pressor support should be decreased near future. Check 2 d for completeness   3. Left ureteral stone with obstruction and urosepsis POD 1 Cystoscopy with Retrograde Pyelogramm/ Left Uretral Stent Placement Renal insufficiency creatinine 0.93-1.07 Plan: Per urology Avoid nephrotoxic drugs Monitor creatinine  4. Anion Gap Metabolic Acidosis 2.3 increased to 3.1 -Most likely secondary lactic acidosis and hypoperfusion -Continue to monitor lactic acid -Currently on bicarbonate drip  5. Hypocalcemia Daily labs to follow for calcium level   Best practice:  Diet: NPO Pain/Anxiety/Delirium protocol (if indicated): fentanyl w/ prn versed VAP protocol (if indicated):  yes DVT prophylaxis: lovenox  GI prophylaxis: PPi Glucose control: BG goal <136m Mobility: bedrest Code Status: FULL Family Communication: 03/19/2018 although patient is intubated she was updated at bedside and nods understanding. Disposition: ICU  Labs   CBC: Recent Labs  Lab 03/18/18 1343 03/19/18 0625  WBC 4.6 27.2*  HGB 15.5* 11.7*  HCT 47.7* 36.1  MCV 90.9 92.3  PLT 253 95*    Basic Metabolic Panel: Recent Labs  Lab 03/18/18 1343  03/19/18 0348  NA 135 139  K 4.1 3.8  CL 103 113*  CO2 21* 15*  GLUCOSE 85 143*  BUN 14 12  CREATININE 0.93 1.07*  CALCIUM 9.3 5.5*   GFR: Estimated Creatinine Clearance: 73.6 mL/min (A) (by C-G formula based on SCr of 1.07 mg/dL (H)). Recent Labs  Lab 03/18/18 1343 03/18/18 1913 03/18/18 2208 03/19/18 0325 03/19/18 0348 03/19/18 0625  PROCALCITON  --   --   --   --  88.13  --   WBC 4.6  --   --   --   --  27.2*  LATICACIDVEN  --  4.65* 4.55* 2.3*  --  3.1*    Liver Function Tests: Recent Labs  Lab 03/18/18 1343 03/19/18 0348  AST 74* 43*  ALT 78* 39  ALKPHOS 100 52  BILITOT 1.4* 1.9*  PROT 8.1 4.5*  ALBUMIN 4.9 2.7*   Recent Labs  Lab 03/18/18 1343  LIPASE 29   No results for input(s): AMMONIA in the last 168 hours.  ABG    Component Value Date/Time   PHART 7.286 (L) 03/19/2018 0531   PCO2ART 42.3 03/19/2018 0531   PO2ART 86.8 03/19/2018 0531   HCO3 19.3 (L) 03/19/2018 0531  ACIDBASEDEF 6.3 (H) 03/19/2018 0531   O2SAT 95.1 03/19/2018 0531     Coagulation Profile: Recent Labs  Lab 03/19/18 0348  INR 1.59    Cardiac Enzymes: No results for input(s): CKTOTAL, CKMB, CKMBINDEX, TROPONINI in the last 168 hours.  HbA1C: Hgb A1c MFr Bld  Date/Time Value Ref Range Status  02/11/2018 09:00 AM 5.4 4.8 - 5.6 % Final    Comment:             Prediabetes: 5.7 - 6.4          Diabetes: >6.4          Glycemic control for adults with diabetes: <7.0     CBG: Recent Labs  Lab 03/19/18 0430  GLUCAP 114*     CODE STATUS:FULL DISPOSITION: ICU PROGNOSIS:Guarded       Critical care time: App cct 45 min      Brett Canales Dmiyah Liscano ACNP [facility] PCCM Pager [phone] till 1 pm If no answer page [phone] 03/19/2018, 11:07 AM

## 2018-03-19 NOTE — Transfer of Care (Signed)
Immediate Anesthesia Transfer of Care Note  Patient: Stephanie Patrick  Procedure(s) Performed: CYSTOSCOPY WITH RETROGRADE PYELOGRAM/URETERAL STENT PLACEMENT (Left Ureter)  Patient Location: PACU  Anesthesia Type:General  Level of Consciousness: awake, alert  and patient cooperative  Airway & Oxygen Therapy: Patient Spontanous Breathing and Patient connected to face mask oxygen  Post-op Assessment: Report given to RN, Post -op Vital signs reviewed and stable and Patient moving all extremities X 4  Post vital signs: unstable  Last Vitals:  Vitals Value Taken Time  BP 102/75 03/19/2018  2:35 AM  Temp    Pulse 119 03/19/2018  2:39 AM  Resp 24 03/19/2018  2:39 AM  SpO2 85 % 03/19/2018  2:39 AM  Vitals shown include unvalidated device data.  Last Pain:  Vitals:   03/19/18 0115  TempSrc:   PainSc: Asleep      Patients Stated Pain Goal: 3 (03/18/18 2040)  Complications: Patient re-intubated  @ 1:52 am .Ptdyspnic. Chest Xray done showing  Wet lungs.O2 sats 80's. Hypotensive and started on levophed. And neosynephrine gtt increased. Lasix  IV given . Dr. Noreene Larsson  Her entire time. Inserted a-line and RIJ central line.  See Dr. Noreene Larsson note

## 2018-03-19 NOTE — Progress Notes (Signed)
CRITICAL VALUE ALERT  Critical Value:  Lactic Acid  Date & Time Notied:  0734  Provider Notified: Dr. Sherene Sires during rounds  Orders Received/Actions taken: Continue current treatment plan

## 2018-03-19 NOTE — Progress Notes (Signed)
CRITICAL VALUE ALERT  Critical Value:  Lactic Acid 2.3  Date & Time Notied: 0450 Provider Notified: Dr. Carlota Raspberry  Orders Received/Actions taken: Continue to monitor

## 2018-03-19 NOTE — Progress Notes (Signed)
Pt transported from PACU to ICU 1232 on vent 100% fio2.  Pt tolerated well without incident.

## 2018-03-19 NOTE — Anesthesia Procedure Notes (Signed)
Arterial Line Insertion Start/End1/17/2020 12:40 AM, 03/19/2018 12:45 AM Performed by: Kipp Brood, MD  Left, brachial was placed Catheter size: 20 G Hand hygiene performed , maximum sterile barriers used  and Seldinger technique used Allen's test indicative of satisfactory collateral circulation Attempts: 1 Procedure performed without using ultrasound guided technique. Following insertion, dressing applied and Biopatch. Patient tolerated the procedure well with no immediate complications.

## 2018-03-19 NOTE — Progress Notes (Signed)
1 Day Post-Op Subjective: Patient Is sedated and on the ventilator.  She decompensated in the PACU last night.  She has received excellent care from anesthesia and critical care. In the operating room she was still obstructed by the LEFT 4 mm distal stone and had a standing column of contrast in the ureter from her prior CT.  Stent was placed successfully.  Objective: Vital signs in last 24 hours: Temp:  [98.1 F (36.7 C)-105 F (40.6 C)] 99.9 F (37.7 C) (01/17 0900) Pulse Rate:  [75-163] 111 (01/17 0900) Resp:  [13-39] 13 (01/17 0900) BP: (75-127)/(41-90) 86/66 (01/17 0800) SpO2:  [75 %-100 %] 100 % (01/17 0900) Arterial Line BP: (44-104)/(4-65) 83/62 (01/17 0245) FiO2 (%):  [80 %-100 %] 80 % (01/17 0813) Weight:  [79.4 kg] 79.4 kg (01/16 1137)  Intake/Output from previous day: 01/16 0701 - 01/17 0700 In: 6884.1 [I.V.:2559.1; IV Piggyback:3800] Out: 1600 [Urine:1600] Intake/Output this shift: Total I/O In: 678 [I.V.:587.4; IV Piggyback:90.7] Out: 500 [Urine:500]  Physical Exam:  Sedated and intubated, she is arousable Abd soft, NT Foley in place - urine clear   Lab Results: Recent Labs    03/18/18 1343 03/19/18 0625  HGB 15.5* 11.7*  HCT 47.7* 36.1   BMET Recent Labs    03/18/18 1343 03/19/18 0348  NA 135 139  K 4.1 3.8  CL 103 113*  CO2 21* 15*  GLUCOSE 85 143*  BUN 14 12  CREATININE 0.93 1.07*  CALCIUM 9.3 5.5*   Recent Labs    03/19/18 0348  INR 1.59   No results for input(s): LABURIN in the last 72 hours. Results for orders placed or performed during the hospital encounter of 03/18/18  Wet prep, genital     Status: Abnormal   Collection Time: 03/18/18  3:36 PM  Result Value Ref Range Status   Yeast Wet Prep HPF POC NONE SEEN NONE SEEN Final   Trich, Wet Prep NONE SEEN NONE SEEN Final   Clue Cells Wet Prep HPF POC NONE SEEN NONE SEEN Final   WBC, Wet Prep HPF POC RARE (A) NONE SEEN Final    Comment: Specimen diluted due to transport tube  containing more than 1 ml of saline, interpret results with caution.   Sperm NONE SEEN  Final    Comment: Performed at [facility], [address], [place], [place] [address]  Blood Culture (routine x 2)     Status: None (Preliminary result)   Collection Time: 03/18/18  7:13 PM  Result Value Ref Range Status   Specimen Description   Final    BLOOD RIGHT FOREARM Performed at [facility], [address] [place], [place] [address]    Special Requests   Final    BOTTLES DRAWN AEROBIC AND ANAEROBIC Blood Culture adequate volume Performed at [facility], [address] [place], [place] [address]    Culture  Setup Time   Final    GRAM NEGATIVE RODS IN BOTH AEROBIC AND ANAEROBIC BOTTLES    Culture GRAM NEGATIVE RODS  Final   Report Status PENDING  Incomplete  Blood Culture (routine x 2)     Status: None (Preliminary result)   Collection Time: 03/18/18  7:13 PM  Result Value Ref Range Status   Specimen Description BLOOD LEFT HAND  Final   Special Requests   Final    BOTTLES DRAWN AEROBIC AND ANAEROBIC Blood Culture results may not be optimal due to an inadequate volume of blood received in  culture bottles   Culture  Setup Time   Final    GRAM NEGATIVE RODS IN BOTH AEROBIC AND ANAEROBIC BOTTLES Performed at [facility], [address], [place], [place] [address]    Culture GRAM NEGATIVE RODS  Final   Report Status PENDING  Incomplete  Surgical pcr screen     Status: Abnormal   Collection Time: 03/18/18 10:51 PM  Result Value Ref Range Status   MRSA, PCR POSITIVE (A) NEGATIVE Final    Comment: RESULT CALLED TO, READ BACK BY AND VERIFIED WITH: STEWART,S RN @0045  ON 03/19/2018 JACKSON,K    Staphylococcus aureus POSITIVE (A) NEGATIVE Final    Comment: (NOTE) The Xpert SA Assay (FDA approved for NASAL specimens in patients 41 years of age and older), is one component of a comprehensive surveillance program. It is not intended to  diagnose infection nor to guide or monitor treatment. Performed at [facility], [address] [place], [place] [address]     Studies/Results: Dg Chest 2 View  Result Date: 03/18/2018 CLINICAL DATA:  Abdominal and LEFT flank pain since this morning EXAM: CHEST - 2 VIEW COMPARISON:  None FINDINGS: Borderline enlargement of cardiac silhouette. Mediastinal contours and pulmonary vascularity normal. RIGHT basilar atelectasis and generally low lung volumes. Upper lungs clear. No pneumothorax. Bones unremarkable. IMPRESSION: Low lung volumes with accentuated heart size and RIGHT basilar atelectasis. Electronically Signed   By: Ulyses Southward M.D.   On: 03/18/2018 20:06   Ct Abdomen Pelvis W Contrast  Result Date: 03/18/2018 CLINICAL DATA:  Patient with left flank pain. EXAM: CT ABDOMEN AND PELVIS WITH CONTRAST TECHNIQUE: Multidetector CT imaging of the abdomen and pelvis was performed using the standard protocol following bolus administration of intravenous contrast. CONTRAST:  ISOVUE-300 IOPAMIDOL (ISOVUE-300) INJECTION 61% COMPARISON:  None. FINDINGS: Lower chest: Normal heart size. Small hiatal hernia. Dependent atelectasis within the left lower lobe. No pleural effusion. Hepatobiliary: Liver is normal in size and contour. No focal hepatic lesions identified. Gallbladder is unremarkable. No intrahepatic or extrahepatic biliary ductal dilatation. Pancreas: Unremarkable Spleen: Unremarkable Adrenals/Urinary Tract: Normal adrenal glands. There is delayed enhancement of the left kidney and moderate left hydroureteronephrosis to the level of the distal left ureter were there is an obstructing 4 mm stone (image 77; series 2). Urinary bladder is unremarkable. Stomach/Bowel: Transverse and descending colon is decompressed, limiting evaluation. Small bowel feces within the distal small bowel. No free fluid or free intraperitoneal air. Vascular/Lymphatic: Normal caliber abdominal aorta. No  retroperitoneal lymphadenopathy. Reproductive: Uterus is unremarkable. Adnexal structures unremarkable. Other: None. Musculoskeletal: Lumbar spine degenerative changes. No aggressive or acute appearing osseous lesions. IMPRESSION: There is an obstructing 4 mm stone within the distal left ureter resulting in mild to moderate left hydroureteronephrosis. Small bowel feces within the distal small bowel which may be secondary to slow transient. Electronically Signed   By: Annia Belt M.D.   On: 03/18/2018 18:23   Dg Chest Port 1 View  Result Date: 03/19/2018 CLINICAL DATA:  Post intubation, ETT position EXAM: PORTABLE CHEST 1 VIEW COMPARISON:  03/19/2018 at 0103 hours FINDINGS: Endotracheal tube terminates 18 mm above the carina. Stable right IJ venous catheter terminating at the cavoatrial junction. Mild interstitial edema. Right infrahilar opacity, favoring edema, less likely infection. Suspected small bilateral pleural effusions, right greater than left. No pneumothorax. The heart is top-normal in size. IMPRESSION: Endotracheal tube terminates 18 mm above the carina. Mild interstitial edema with small bilateral pleural effusions, right greater than left. Right infrahilar  opacity, favoring edema, less likely infection. Electronically Signed   By: Charline Bills M.D.   On: 03/19/2018 02:51   Dg Chest Port 1 View  Result Date: 03/19/2018 CLINICAL DATA:  Postop EXAM: PORTABLE CHEST 1 VIEW COMPARISON:  03/18/2018 FINDINGS: Left upper lobe and right lower lobe opacities, favoring perihilar edema, less likely mild infection. No definite pleural effusions. The heart is normal in size. Right IJ venous catheter terminates at the cavoatrial junction. IMPRESSION: Right IJ venous catheter terminates at the cavoatrial junction. Electronically Signed   By: Charline Bills M.D.   On: 03/19/2018 02:03   Dg C-arm 1-60 Min-no Report  Result Date: 03/19/2018 Fluoroscopy was utilized by the requesting physician.  No  radiographic interpretation.    Assessment/Plan: --Left distal ureteral stone with hydronephrosis-status post left ureteral stent.  She'll ultimately need left ureteroscopy. --Melene Muller continues to receive care from critical care. I discussed the patient with Dr. Sherene Sires.  When I slid the wire up past the stone, the obstruction was relieved and purulent urine did drain.    LOS: 1 day   Jerilee Field 03/19/2018, 9:44 AM

## 2018-03-19 NOTE — Progress Notes (Signed)
RN asked patient if RN could call patient's mother to notify about patient's status. Patient refused at this time but RN will continue to attempt when patient becomes more alert.

## 2018-03-19 NOTE — Progress Notes (Signed)
Patient communicated through writing that tube feeds started at 6pm were making her nauseous. Tube feeds on hold for now. Will notify MD.  Will continue to monitor.

## 2018-03-19 NOTE — Progress Notes (Addendum)
Initial Nutrition Assessment  DOCUMENTATION CODES:   Obesity unspecified  INTERVENTION:  Recommend   Vital High Protein @ 27ml/hr Pro-stat BID via tube  Provides 1160 kcals, 114 grams protein, 803 ml free water   NUTRITION DIAGNOSIS:   Inadequate oral intake related to inability to eat as evidenced by NPO status.   GOAL:   Provide needs based on ASPEN/SCCM guidelines   MONITOR:   Vent status  REASON FOR ASSESSMENT:   Ventilator    ASSESSMENT:  33 year old female with medical history significant of recurrent UTIs and admitted with left ureteral stone with obstruction. Transferred to ICU s/p urological intervention when pt was hypoxic and hypotensive   1/17 Intubated  Patient intubated for left ureteral retrograde pyelogram and stent placement on 1/16, extubated and brought to PACU where she suffered persistent hypotension, tachypnea, and tachycardia; chest xray showed bilateral perihilar edema. Patient sedated with Precedex, 4mg  of Versed, fentanyl.   NP note from this morning reports: on norepinephrine, stress steroids, metabolic acidosis, added vasopressin meeting criteria for angiotension 2 if not responding to vasopressin.   Patient awake and responding to yes/no questions and requested paper to write. Patient reports good PO prior to admission eating 2 meals/day, writing that she eats all organic foods. She does endorse some recent wt loss and has noticed a difference in her abdominal area. Patient recalls UBW of 175 lbs.   Patient is currently intubated on ventilator support MV: 8.2 L/min  Temp (24hrs), Avg:100.2 F (37.9 C), Min:98.1 F (36.7 C), Max:105 F (40.6 C)   Medications reviewed and include: Fentanyl 175mcg/hr Levophed, Versed 2mg  every 15 min sodium bicarbonate 150 in D5 @75mL /hr  BP: 103/76 (A-line) MAP: 88  I/O: + 5284 ml since admit UOP: 1600 ml x 24 hrs   NUTRITION - FOCUSED PHYSICAL EXAM:    Most Recent Value  Orbital  Region  No depletion  Upper Arm Region  No depletion  Thoracic and Lumbar Region  No depletion  Buccal Region  Unable to assess  Temple Region  No depletion  Clavicle Bone Region  No depletion  Clavicle and Acromion Bone Region  No depletion  Scapular Bone Region  No depletion  Dorsal Hand  No depletion  Patellar Region  No depletion  Anterior Thigh Region  No depletion  Posterior Calf Region  No depletion  Edema (RD Assessment)  Mild [generalized,  non-pitting]  Hair  Reviewed  Eyes  Reviewed  Mouth  Unable to assess  Skin  Reviewed [clammy]  Nails  Unable to assess [painted with dark polish]       Diet Order:   Diet Order            Diet regular Room service appropriate? Yes; Fluid consistency: Thin  Diet effective now              EDUCATION NEEDS:   Not appropriate for education at this time  Skin:  Skin Assessment: Reviewed RN Assessment  Last BM:  unknown  Height:   Ht Readings from Last 1 Encounters:  03/18/18 5\' 2"  (1.575 m)    Weight:   Wt Readings from Last 1 Encounters:  03/18/18 79.4 kg    Ideal Body Weight:  50 kg  BMI:  Body mass index is 32.01 kg/m.  Estimated Nutritional Needs:   Kcal:  [phone] (11-14 kcal/kg)  Protein:  100 grams (2g/kg/IBW)  Fluid:  </= 2L/day    Lars Masson, RD, LDN  After Hours/Weekend Pager: [phone]

## 2018-03-19 NOTE — Progress Notes (Signed)
RT at the bedside at this time for Blood Gas. Pt is verbal and asks for water. Will continue to monitor and tx pt according to MD orders.

## 2018-03-19 NOTE — Progress Notes (Signed)
  Echocardiogram 2D Echocardiogram has been performed.  Stephanie Patrick 03/19/2018, 2:22 PM

## 2018-03-19 NOTE — Anesthesia Procedure Notes (Signed)
Procedure Name: Intubation Date/Time: 03/18/2018 11:21 PM Performed by: Illene Silver, CRNA Pre-anesthesia Checklist: Patient identified, Emergency Drugs available, Suction available and Patient being monitored Patient Re-evaluated:Patient Re-evaluated prior to induction Oxygen Delivery Method: Circle system utilized Preoxygenation: Pre-oxygenation with 100% oxygen Induction Type: IV induction, Rapid sequence and Cricoid Pressure applied Ventilation: Mask ventilation without difficulty Laryngoscope Size: Glidescope and 4 Tube type: Oral Tube size: 7.5 mm Number of attempts: 1 Airway Equipment and Method: Stylet,  Oral airway and Video-laryngoscopy Placement Confirmation: ETT inserted through vocal cords under direct vision,  positive ETCO2 and breath sounds checked- equal and bilateral Secured at: 21 cm Tube secured with: Tape Dental Injury: Teeth and Oropharynx as per pre-operative assessment

## 2018-03-19 NOTE — Progress Notes (Signed)
Anesthesiology Follow-up:  Awake on vent, appears alert, following commands, moving all extremities.  Oxygenation improving,  FiO2- 0.6 with Peep- 8, O2 Sat 99%  Still requiring Norepi  14 mcg/min and vasopressin 0.03 units/min, Bicarbonate drip. On fentanyl infusion for sedation  VS: T-38.4 BP- 111/79 HR- 89   Septic shock and respiratory failure due to gram (-) sepsis from ureteral obstruction by stone. Looks better today. Continue supportive care per CCM.   Kipp Brood

## 2018-03-19 NOTE — Progress Notes (Signed)
CRITICAL VALUE ALERT  Critical Value:  Lactic Acid  Date & Time Notied:  0734  Provider Notified: Oncoming RN Donnal Debar will notify rounding CCM doctor  Orders Received/Actions taken: TBD; will continue to monitor

## 2018-03-19 NOTE — Progress Notes (Signed)
CRITICAL VALUE ALERT  Critical Value:  Calcium 5.5  Date & Time Notied:  0505  Provider Notified: Dr. Carlota Raspberry  Orders Received/Actions taken: Continue to monitor

## 2018-03-19 NOTE — Progress Notes (Signed)
Anesthesiology note:  Stephanie Patrick is a 33 year old female who developed a left ureteral stone with obstruction and urosepsis.  She was brought to the [facility] operating room at 2330 on January 16 and underwent left ureteral retrograde pyelogram and ureteral stent placement by Dr. Lennox Laity.    Upon examination, she was awake and alert, but anxious and tachypneic. Her initial vital signs upon arrival into the operating room were heart rate of 150 blood pressure 76/50.  She was intubated for the procedure and given a liter of crystalloid and 500 cc of 5% albumin.  She was then extubated and brought to the PACU but suffered persistent hypotension, tachypnea and tachycardia with oxygen saturations were in the 70s to 80s on 100% nonrebreather mask.   ABG PH 7.26 PCO2 34 PO2 56 Base deficit-11  The chest x-ray showed bilateral perihilar edema.  She was intubated.  A right internal jugular central line and a left brachial art line were placed.  She is now being sedated with a Precedex infusion and 4 mg of Versed and 100 mcg of fentanyl.  She was given 100 mg of rocuronium as well.  She is presently on Levophed 10 mcg/kg/min and Neo-Synephrine 66 mcg/min for blood pressure support.  Vital signs: Blood pressure 88/62 heart rate 134 with sinus tachycardia oxygen saturation 87% on 100% oxygen and full ventilatory support.  Impression 33 year old female with septic shock and respiratory failure from urinary sepsis.  Plan: 1.  Consult CCM 2. Continue full ventilator ventilatory and hemodynamic support. 3.  Her boyfriend was notified of her condition and he is aware that she will remain intubated for least 1-2 days.  Kipp Brood, MD

## 2018-03-19 NOTE — Consult Note (Addendum)
..   NAME:  Stephanie Patrick, MRN:  [id number], DOB:  Jul 20, 1985, LOS: 1 ADMISSION DATE:  03/18/2018, CONSULTATION DATE:  03/19/2018 REFERRING MD:  , CHIEF COMPLAINT:  Acute hypoxic resp failure   Brief History   33 yr old female with PMHx sig for vit D def, recurrent [facility] with left flank pain found to have a Left ureteral stone with obstruction. PCCM consulted for ICU mgmt post Urological intervention when pt was hypoxic and hypotensive.   History of present illness   (Obtained from EMR review, reports from Rn and acct of other provider)  33 yr old female with PMHx for Depression, Vitamin D Deficiency, recurrent  [facility] for [facility] flank pain. 1/16  She was found to be septic with a temperature of 105 and tachycardia.  Her urine showed many bacteria, 0-5 whites and 0-5 reds.  White count and creatinine normal.  CT scan revealed a 5 mm distal left ureteral stone but there was proximal mild - moderate hydronephrosis and decreased enhancement from the left kidney consistent with high-grade obstruction.  The left kidney may also have some edema. A foley was placed for a bladder scan ~ 500 and pt couldn't void with left flank pain found to have a Left ureteral stone with obstruction. Pt received a Cystoscopy with Retrograde Pyelogramm/ Left Uretral Stent Placement post op pt required pressors and increased PEEP due to hypotension and hypoxia.   Past Medical History  .Marland Kitchen. Active Ambulatory Problems    Diagnosis Date Noted  . Recurrent UTI 02/13/2017  . Depression 02/13/2017  . ADHD 02/13/2017  . Migraines 02/16/2017  . Vitamin D deficiency 02/16/2017  . Preventative health care 02/16/2017  . Insomnia 02/16/2017   Resolved Ambulatory Problems    Diagnosis Date Noted  . No Resolved Ambulatory Problems   Past Medical History:  Diagnosis Date  . Chicken pox   . Urinary tract infection       Consults:  Urology 03/18/2018 PCCM 03/19/2018  Procedures:    Endotracheally intubated 03/19/2018 Arterial line 03/19/2018 Cystoscopy with Retrograde Pyelogram/ Left Ureteral Stent Placement 03/19/2018 Significant Diagnostic Tests:    Micro Data:  MRSA PCR positive1/16/2020 Blood cx x 2 NGTD 03/18/2018  Antimicrobials:  Vancomycin- 03/18/2018 - 1 dose Rocephin 03/18/2018     Objective   Blood pressure 97/69, pulse (!) 119, temperature 99.3 F (37.4 C), resp. rate (!) 24, height 5\' 2"  (1.575 m), weight 79.4 kg, last menstrual period 03/01/2018, SpO2 (!) 87 %.    Vent Mode: PRVC FiO2 (%):  [100 %] 100 % Set Rate:  [24 bmp] 24 bmp Vt Set:  [400 mL] 400 mL PEEP:  [12 cmH20] 12 cmH20 Plateau Pressure:  [27 cmH20] 27 cmH20   Intake/Output Summary (Last 24 hours) at 03/19/2018 [id number] Last data filed at 03/19/2018 0300 Gross per 24 hour  Intake 6625 ml  Output 1600 ml  Net 5025 ml   Filed Weights   03/18/18 1137  Weight: 79.4 kg    Examination: General: intubated HENT: NCAT ETT in position  Lungs: +crackles bilaterally at bases Cardiovascular: S1 and S2 increased rate Abdomen: obese soft abdomen not distended no bruising or striae Extremities: lower ext no pitting edema Neuro: held sedation for exam , opens eyes follows commands   Assessment & Plan:  1. Acute hypoxic Respiratory Failure Pulmonary Edema Endotracheally intubated- ETT pulled back to optimal position Reviewed CXR which shows Mild interstitial edema with small bilateral pleural effusions, right greater  than left.  Right infrahilar opacity, favoring edema, less likely infection. Requiring increased PEEP post op ABG on PEEP 12 FiO2 100% TV 6 cc/kg RR24 - 7.2/45/79/16 Respiratory Acidosis with Anion Gap Metabolic Acidosis and Non Gap Acidosis Plan:TV 8cc/kg RR 20 PEEP decreased to 10 cmH20 to help with hemodynamics FiO2 will wean as we diurese and oxygenation improves  2. Septic Shock  from Left uretral stone with obstruction MAP goal >49mmHg Currently on Vasopressors  (Neo gtt and Levophed gtt) Has a left brachial A line placed by Anesthesia Plan: Cox 74.9 which fits sepsis CVP 21- room for diuresis when MAP allows Wean off neo gtt and titrate Levo to keep MAP >30mmHg Will try to decrease PEEP on vent to help with hemodynamics w/o compromising oxygenation Pt has a h/o being on Prednisone per EMR- check cortisol pt may benefit from stress dose steroids   3. Left ureteral stone with obstruction and urosepsis POD 0 Cystoscopy with Retrograde Pyelogramm/ Left Uretral Stent Placement Plan: Urology following Pt has an indwelling Temp foley continue to trend UOP Avoid nephrotoxic meds  4. Anion Gap Metabolic Acidosis - secondary to lactic acidosis from hypoperfusion - LA improving currently 2.3 - on bicarb gtt due ot Base deficit  5. Hypocalcemia Corrected level for albumin Replaced Calcium   Best practice:  Diet: NPO Pain/Anxiety/Delirium protocol (if indicated): fentanyl w/ prn versed VAP protocol (if indicated):  yes DVT prophylaxis: lovenox  GI prophylaxis: PPi Glucose control: BG goal <159m Mobility: bedrest Code Status: FULL Family Communication: none present at time of my evaluation Disposition: ICU  Labs   CBC: Recent Labs  Lab 03/18/18 1343  WBC 4.6  HGB 15.5*  HCT 47.7*  MCV 90.9  PLT 253    Basic Metabolic Panel: Recent Labs  Lab 03/18/18 1343  NA 135  K 4.1  CL 103  CO2 21*  GLUCOSE 85  BUN 14  CREATININE 0.93  CALCIUM 9.3   GFR: Estimated Creatinine Clearance: 84.7 mL/min (by C-G formula based on SCr of 0.93 mg/dL). Recent Labs  Lab 03/18/18 1343 03/18/18 1913 03/18/18 2208  WBC 4.6  --   --   LATICACIDVEN  --  4.65* 4.55*    Liver Function Tests: Recent Labs  Lab 03/18/18 1343  AST 74*  ALT 78*  ALKPHOS 100  BILITOT 1.4*  PROT 8.1  ALBUMIN 4.9   Recent Labs  Lab 03/18/18 1343  LIPASE 29   No results for input(s): AMMONIA in the last 168 hours.  ABG    Component Value Date/Time    PHART 7.260 (L) 03/19/2018 0120   PCO2ART 34.1 03/19/2018 0120   PO2ART 56.4 (L) 03/19/2018 0120   HCO3 14.7 (L) 03/19/2018 0120   ACIDBASEDEF 10.9 (H) 03/19/2018 0120   O2SAT 82.9 03/19/2018 0120     Coagulation Profile: No results for input(s): INR, PROTIME in the last 168 hours.  Cardiac Enzymes: No results for input(s): CKTOTAL, CKMB, CKMBINDEX, TROPONINI in the last 168 hours.  HbA1C: Hgb A1c MFr Bld  Date/Time Value Ref Range Status  02/11/2018 09:00 AM 5.4 4.8 - 5.6 % Final    Comment:             Prediabetes: 5.7 - 6.4          Diabetes: >6.4          Glycemic control for adults with diabetes: <7.0     CBG: No results for input(s): GLUCAP in the last 168 hours.  Review of Systems:  Unable to obtain due to medical condition, pt is endotracheally intubated   Past Medical History  She,  has a past medical history of ADHD (02/13/2017), Chicken pox, Depression, Migraines, and Urinary tract infection.   Surgical History   History reviewed. No pertinent surgical history.   Social History   reports that she has never smoked. She has never used smokeless tobacco. She reports current alcohol use. She reports that she does not use drugs.   Family History   Her family history includes Cancer in her maternal grandmother; Diabetes in her maternal grandmother; Hypertension in her maternal grandmother.   Allergies Allergies  Allergen Reactions  . Vicodin [Hydrocodone-Acetaminophen] Nausea And Vomiting     Home Medications  Prior to Admission medications   Medication Sig Start Date End Date Taking? Authorizing Provider  amphetamine-dextroamphetamine (ADDERALL) 5 MG tablet Take 5 mg by mouth daily.   Yes [provider]  diphenhydramine-acetaminophen (TYLENOL PM) 25-500 MG TABS tablet Take 1 tablet by mouth at bedtime as needed (sleep).   Yes [provider]  fluticasone (FLONASE) 50 MCG/ACT nasal spray Place 1 spray into both nostrils 2 (two) times  daily. 03/09/18 05/08/18 Yes Betancourt, Jarold Songina A, NP  predniSONE (STERAPRED UNI-PAK 21 TAB) 10 MG (21) TBPK tablet Taper take 6 pills by mouth day 1; 5 pills day 2; 4 pills day 3; 3 pills day 4; 2 pills day 5 and 1 pill day 6 with breakfast daily Patient not taking: Reported on 03/18/2018 03/11/18   Barbaraann BarthelBetancourt, Tina A, NP     I, Dr Newell CoralKristen Evanne Matsunaga have personally reviewed patient's available data, including medical history, events of note, physical examination and test results as part of my evaluation. I have discussed with Care team regarding plan for the patient.   The patient is critically ill with multiple organ systems failure and requires high complexity decision making for assessment and support, frequent evaluation and titration of therapies, application of advanced monitoring technologies and extensive interpretation of multiple databases.   Critical Care Time devoted to patient care services described in this note is 65 Minutes. This time reflects time of care of this signee Dr Newell CoralKristen Andretta Ergle. This critical care time does not reflect procedure time, or teaching time or supervisory time of NP but could involve care discussion time   CC TIME: 65 minutes CODE STATUS:FULL DISPOSITION: ICU PROGNOSIS:Guarded   Dr. Newell CoralKristen Marzell [facility]  03/19/2018 5:23 AM      Critical care time: 65 mins

## 2018-03-19 NOTE — Progress Notes (Signed)
Central Line is being placed at this time. Will continue to monitor and tx pt according to Md orders. Pt is calm and has no s/s of distress at this time. [place] at the bedside. CRNA remains at  the bedside.

## 2018-03-19 NOTE — Progress Notes (Signed)
Intubating at this time.

## 2018-03-19 NOTE — Progress Notes (Signed)
RT at the bedside at this time with Vent. Will continue to monitor and tx pt according to MD orders. MD remains at the bedside. CRNA present as well.
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
## 2018-03-19 NOTE — Progress Notes (Signed)
CRITICAL VALUE ALERT  Critical Value: Lactic acid elevated but trending down  Date & Time Notied: 03/20/18  Provider Notified: CCM  Orders Received/Actions taken: pending

## 2018-03-19 NOTE — Progress Notes (Signed)
Vital High Protein to remain off per Deterding, MD due to patient reporting nausea.

## 2018-03-19 NOTE — Anesthesia Postprocedure Evaluation (Signed)
Anesthesia Post Note  Patient: Stephanie Patrick  Procedure(s) Performed: CYSTOSCOPY WITH RETROGRADE PYELOGRAM/URETERAL STENT PLACEMENT (Left Ureter)     Patient location during evaluation: PACU Anesthesia Type: General Level of consciousness: sedated and patient remains intubated per anesthesia plan Pain management: pain level controlled Vital Signs Assessment: post-procedure vital signs reviewed and stable Respiratory status: patient remains intubated per anesthesia plan, respiratory function unstable and patient on ventilator - see flowsheet for VS Cardiovascular status: tachycardic Postop Assessment: no apparent nausea or vomiting Anesthetic complications: no    Last Vitals:  Vitals:   03/19/18 0230 03/19/18 0245  BP: 93/70 97/69  Pulse: (!) 121 (!) 119  Resp: (!) 24 (!) 24  Temp:    SpO2: (!) 85% (!) 87%    Last Pain:  Vitals:   03/19/18 0200  TempSrc:   PainSc: Asleep                 Daxen Lanum COKER

## 2018-03-19 NOTE — Care Management Note (Signed)
Case Management Note  Patient Details  Name: Stephanie Patrick MRN: [id number] Date of Birth: 16-Jan-1986  Subjective/Objective:                  In icu on vent-chart note+Patient Is sedated and on the ventilator.  She decompensated in the PACU last night.  She has received excellent care from anesthesia and critical care. In the operating room she was still obstructed by the LEFT 4 mm distal stone and had a standing column of contrast in the ureter from her prior CT.  Stent was placed successfully.  Action/Plan: Following for progression of care and condition. Following for cm needs none present at this time.  Expected Discharge Date:  (unknown)               Expected Discharge Plan:     In-House Referral:     Discharge planning Services     Post Acute Care Choice:    Choice offered to:     DME Arranged:    DME Agency:     HH Arranged:    HH Agency:     Status of Service:     If discussed at [employer], dates discussed:    Additional Comments:  Golda Acre, RN 03/19/2018, 9:53 AM

## 2018-03-19 NOTE — Progress Notes (Signed)
Levophed increasing to 30 ml/hr per MD orders at this time.

## 2018-03-19 NOTE — Progress Notes (Signed)
Art Line inserted at this time.

## 2018-03-19 NOTE — Progress Notes (Signed)
PHARMACY - PHYSICIAN COMMUNICATION CRITICAL VALUE ALERT - BLOOD CULTURE IDENTIFICATION (BCID)  Stephanie Patrick is an 33 y.o. female who presented to [facility] on 03/18/2018 with a chief complaint of abd/flank pain  Assessment:  urosepsis  Name of physician (or Provider) Contacted: Wert  Current antibiotics: gent 400 mg IV x 1, CTX 1 gm q24  Changes to prescribed antibiotics recommended:  Recommendations accepted by provider-  Increase Rocephin to 2 gm q24  Results for orders placed or performed during the hospital encounter of 03/18/18  Blood Culture ID Panel (Reflexed) (Collected: 03/18/2018  7:13 PM)  Result Value Ref Range   Enterococcus species NOT DETECTED NOT DETECTED   Listeria monocytogenes NOT DETECTED NOT DETECTED   Staphylococcus species NOT DETECTED NOT DETECTED   Staphylococcus aureus (BCID) NOT DETECTED NOT DETECTED   Streptococcus species NOT DETECTED NOT DETECTED   Streptococcus agalactiae NOT DETECTED NOT DETECTED   Streptococcus pneumoniae NOT DETECTED NOT DETECTED   Streptococcus pyogenes NOT DETECTED NOT DETECTED   Acinetobacter baumannii NOT DETECTED NOT DETECTED   Enterobacteriaceae species DETECTED (A) NOT DETECTED   Enterobacter cloacae complex NOT DETECTED NOT DETECTED   Escherichia coli DETECTED (A) NOT DETECTED   Klebsiella oxytoca NOT DETECTED NOT DETECTED   Klebsiella pneumoniae NOT DETECTED NOT DETECTED   Proteus species NOT DETECTED NOT DETECTED   Serratia marcescens NOT DETECTED NOT DETECTED   Carbapenem resistance NOT DETECTED NOT DETECTED   Haemophilus influenzae NOT DETECTED NOT DETECTED   Neisseria meningitidis NOT DETECTED NOT DETECTED   Pseudomonas aeruginosa NOT DETECTED NOT DETECTED   Candida albicans NOT DETECTED NOT DETECTED   Candida glabrata NOT DETECTED NOT DETECTED   Candida krusei NOT DETECTED NOT DETECTED   Candida parapsilosis NOT DETECTED NOT DETECTED   Candida tropicalis NOT DETECTED NOT DETECTED    Herby Abraham,  Pharm.D [phone] 03/19/2018 10:05 AM

## 2018-03-19 NOTE — Op Note (Signed)
Preoperative diagnosis: Left hydronephrosis, left ureteral stone, sepsis Postoperative diagnosis: Same  Procedure: Cystoscopy, left retrograde pyelogram, left ureteral stent placement  Surgeon: Mena Goes  Anesthesia: General  Indication for procedure: 33 year old female with sepsis and a 4 mm distal stone and hydro-on CT.  She was brought for urgent stenting.  Findings: No stone or foreign body in the bladder.  On scout imaging there was a standing nephrogram outlining the collecting system and ureter all the way down to the distal ureter likely from continued obstruction.  After the wire was passed copious purulent urine drained.  Left retrograde pyelogram-this outlined a filling defect at the ureterovesical junction consistent with the stone in the proximal hydroureteronephrosis.  Description of procedure: After consent was obtained the patient brought to the operating room.  After adequate anesthesia she was placed in lithotomy position and prepped and draped in usual sterile fashion.  A timeout was performed to confirm the patient and procedure.  The cystoscope was passed per urethra and the bladder inspected.  The left ureteral orifice cannulated with a 6 [place] open-ended catheter and left retrograde injection of contrast was performed.  I just injected and of contrast to connect with the proximal ureter to better outline the anatomy.  A sensor wire was then advanced and coiled in the collecting system and a 624 stent was passed.  The wire was removed with a good coil seen in the kidney/renal pelvis and a good coil in the bladder.  The scope was removed.  A 16 French temperature probe Foley catheter was placed in left to gravity drainage.  She was awakened and taken to the recovery room in stable condition.  Complications: None  Blood loss: Minimal  Specimens: None  Drains: 6 x 24 cm left ureteral stent  Disposition: Patient stable to PACU

## 2018-03-19 NOTE — Progress Notes (Signed)
Levophed 20 ml/hr infusing at this time.  Neo 100 ml/ her infusing at this time.Pt reports that she has nausea and dry mouth at this time. Pt also reports that she has chest pressure at this time. MD remains at the bedside. Will continue to monitor and tx pt according to MD orders.   Preparing to insert an Art Line at this time. Pt is resting calming and verbal. Will continue to monitor and tx according to MD orders.

## 2018-03-19 NOTE — Discharge Instructions (Signed)
Ureteral Stent Implantation, Care After Refer to this sheet in the next few weeks. These instructions provide you with information about caring for yourself after your procedure. Your health care provider may also give you more specific instructions. Your treatment has been planned according to current medical practices, but problems sometimes occur. Call your health care provider if you have any problems or questions after your procedure.  Stent/stone removal-be sure to follow-up with Dr. Mena Goes for stent/stone removal.  What can I expect after the procedure? After the procedure, it is common to have:  Nausea.  Mild pain when you urinate. You may feel this pain in your lower back or lower abdomen. Pain should stop within a few minutes after you urinate. This may last for up to 1 week.  A small amount of blood in your urine for several days. Follow these instructions at home:  Medicines  Take over-the-counter and prescription medicines only as told by your health care provider.  If you were prescribed an antibiotic medicine, take it as told by your health care provider. Do not stop taking the antibiotic even if you start to feel better.  Do not drive for 24 hours if you received a sedative.  Do not drive or operate heavy machinery while taking prescription pain medicines. Activity  Return to your normal activities as told by your health care provider. Ask your health care provider what activities are safe for you.  Do not lift anything that is heavier than 10 lb (4.5 kg). Follow this limit for 1 week after your procedure, or for as long as told by your health care provider. General instructions  Watch for any blood in your urine. Call your health care provider if the amount of blood in your urine increases.  If you have a catheter: ? Follow instructions from your health care provider about taking care of your catheter and collection bag. ? Do not take baths, swim, or use a hot  tub until your health care provider approves.  Drink enough fluid to keep your urine clear or pale yellow.  Keep all follow-up visits as told by your health care provider. This is important. Contact a health care provider if:  You have pain that gets worse or does not get better with medicine, especially pain when you urinate.  You have difficulty urinating.  You feel nauseous or you vomit repeatedly during a period of more than 2 days after the procedure. Get help right away if:  Your urine is dark red or has blood clots in it.  You are leaking urine (have incontinence).  The end of the stent comes out of your urethra.  You cannot urinate.  You have sudden, sharp, or severe pain in your abdomen or lower back.  You have a fever. This information is not intended to replace advice given to you by your health care provider. Make sure you discuss any questions you have with your health care provider. Document Released: 10/20/2012 Document Revised: 07/26/2015 Document Reviewed: 09/01/2014 Elsevier Interactive Patient Education  2019 [employer].

## 2018-03-19 NOTE — Progress Notes (Signed)
Radiology is at the bedside at this time. Pt reports that her face hurts. Will continue to monitor and tx pt according to MD orders.

## 2018-03-19 NOTE — Progress Notes (Signed)
Dr. Noreene Larsson preparing to insert a central line at this time. CRNA is at the bedside as well. Will continue to monitor and tx pt according to MD orders.

## 2018-03-20 ENCOUNTER — Inpatient Hospital Stay (HOSPITAL_COMMUNITY): Payer: Self-pay

## 2018-03-20 DIAGNOSIS — J9602 Acute respiratory failure with hypercapnia: Secondary | ICD-10-CM

## 2018-03-20 DIAGNOSIS — J9601 Acute respiratory failure with hypoxia: Secondary | ICD-10-CM

## 2018-03-20 DIAGNOSIS — J811 Chronic pulmonary edema: Secondary | ICD-10-CM

## 2018-03-20 DIAGNOSIS — N390 Urinary tract infection, site not specified: Secondary | ICD-10-CM

## 2018-03-20 HISTORY — DX: Chronic pulmonary edema: J81.1

## 2018-03-20 LAB — BASIC METABOLIC PANEL
Anion gap: 13 (ref 5–15)
BUN: 19 mg/dL (ref 6–20)
CO2: 24 mmol/L (ref 22–32)
CREATININE: 1.07 mg/dL — AB (ref 0.44–1.00)
Calcium: 7.2 mg/dL — ABNORMAL LOW (ref 8.9–10.3)
Chloride: 108 mmol/L (ref 98–111)
GFR calc Af Amer: >60 mL/min (ref >60)
GFR calc non Af Amer: >60 mL/min (ref >60)
Glucose, Bld: 153 mg/dL — ABNORMAL HIGH (ref 70–99)
Potassium: 4.5 mmol/L (ref 3.5–5.1)
SODIUM: 145 mmol/L (ref 135–145)

## 2018-03-20 LAB — BLOOD GAS, ARTERIAL
ACID-BASE EXCESS: 0 mmol/L (ref 0.0–2.0)
Bicarbonate: 23.1 mmol/L (ref 20.0–28.0)
Drawn by: 51425
FIO2: 40
O2 Saturation: 98.2 %
PEEP: 8 cmH2O
Patient temperature: 37
RATE: 24 resp/min
VT: 400 mL
pCO2 arterial: 33.7 mmHg (ref 32.0–48.0)
pH, Arterial: 7.452 — ABNORMAL HIGH (ref 7.350–7.450)
pO2, Arterial: 106 mmHg (ref 83.0–108.0)

## 2018-03-20 LAB — CBC WITH DIFFERENTIAL/PLATELET
Abs Immature Granulocytes: 6.54 10*3/uL — ABNORMAL HIGH (ref 0.00–0.07)
BASOS ABS: 0.2 10*3/uL — AB (ref 0.0–0.1)
Basophils Relative: 0 %
Eosinophils Absolute: 0 10*3/uL (ref 0.0–0.5)
Eosinophils Relative: 0 %
HCT: 33.7 % — ABNORMAL LOW (ref 36.0–46.0)
Hemoglobin: 10.8 g/dL — ABNORMAL LOW (ref 12.0–15.0)
Immature Granulocytes: 17 %
Lymphocytes Relative: 3 %
Lymphs Abs: 1.3 10*3/uL (ref 0.7–4.0)
MCH: 29.6 pg (ref 26.0–34.0)
MCHC: 32 g/dL (ref 30.0–36.0)
MCV: 92.3 fL (ref 80.0–100.0)
Monocytes Absolute: 1.3 10*3/uL — ABNORMAL HIGH (ref 0.1–1.0)
Monocytes Relative: 3 %
NRBC: 0 % (ref 0.0–0.2)
Neutro Abs: 29.8 10*3/uL — ABNORMAL HIGH (ref 1.7–7.7)
Neutrophils Relative %: 77 %
Platelets: 64 10*3/uL — ABNORMAL LOW (ref 150–400)
RBC: 3.65 MIL/uL — ABNORMAL LOW (ref 3.87–5.11)
RDW: 13.9 % (ref 11.5–15.5)
WBC: 39.1 10*3/uL — ABNORMAL HIGH (ref 4.0–10.5)

## 2018-03-20 LAB — URINE CULTURE: Culture: NO GROWTH

## 2018-03-20 LAB — MAGNESIUM
Magnesium: 2 mg/dL (ref 1.7–2.4)
Magnesium: 2.1 mg/dL (ref 1.7–2.4)

## 2018-03-20 LAB — LACTIC ACID, PLASMA: LACTIC ACID, VENOUS: 3.9 mmol/L — AB (ref 0.5–1.9)

## 2018-03-20 LAB — GLUCOSE, CAPILLARY
Glucose-Capillary: 154 mg/dL — ABNORMAL HIGH (ref 70–99)
Glucose-Capillary: 135 mg/dL — ABNORMAL HIGH (ref 70–99)
Glucose-Capillary: 101 mg/dL — ABNORMAL HIGH (ref 70–99)
Glucose-Capillary: 97 mg/dL (ref 70–99)
Glucose-Capillary: 103 mg/dL — ABNORMAL HIGH (ref 70–99)
Glucose-Capillary: 96 mg/dL (ref 70–99)

## 2018-03-20 LAB — PHOSPHORUS
Phosphorus: 2.5 mg/dL (ref 2.5–4.6)
Phosphorus: 1.9 mg/dL — ABNORMAL LOW (ref 2.5–4.6)

## 2018-03-20 MED ORDER — OXYMETAZOLINE HCL 0.05 % NA SOLN
1.0000 | Freq: Two times a day (BID) | NASAL | Status: AC
  Administered 2018-03-21 – 2018-03-22 (×2): 1 via NASAL
  Filled 2018-03-20: qty 15

## 2018-03-20 MED ORDER — LIP MEDEX EX OINT
TOPICAL_OINTMENT | CUTANEOUS | Status: AC
  Administered 2018-03-20: 13:00:00
  Filled 2018-03-20: qty 7

## 2018-03-20 NOTE — Progress Notes (Addendum)
..   NAME:  Stephanie Patrick, MRN:  [id number], DOB:  09/29/1985, LOS: 2 ADMISSION DATE:  03/18/2018, CONSULTATION DATE:  03/19/2018 REFERRING MD:  , CHIEF COMPLAINT:  Acute hypoxic resp failure   Brief History   33 yo female with PMHx sig for vit D def, recurrent UTI presented to [facility] on 1/16 with left flank pain found to have a Left ureteral stone with obstruction > emergent stent drained pus from L kidney but pt deteriorated with apparent septic shock and ccm service requested.   History of present illness   (Obtained from EMR review, reports from Rn and acct of other provider)  33 yr old female with PMHx for Depression, Vitamin D Deficiency, recurrent  UTIs resents to [facility] ED for left flank pain. 1/16  She was found to be septic with a temperature of 105 and tachycardia.  Her urine showed many bacteria, 0-5 whites and 0-5 reds.  White count and creatinine normal.  CT scan revealed a 5 mm distal left ureteral stone but there was proximal mild - moderate hydronephrosis and decreased enhancement from the left kidney consistent with high-grade obstruction.  The left kidney may also have some edema. A foley was placed for a bladder scan ~ 500 and pt couldn't void with left flank pain found to have a Left ureteral stone with obstruction. Pt received a Cystoscopy with Retrograde Pyelogramm/ Left Uretral Stent Placement post op pt required pressors and increased PEEP due to hypotension and hypoxia.   Past Medical History  .Marland Kitchen Active Ambulatory Problems    Diagnosis Date Noted  . Recurrent UTI 02/13/2017  . Depression 02/13/2017  . ADHD 02/13/2017  . Migraines 02/16/2017  . Vitamin D deficiency 02/16/2017  . Preventative health care 02/16/2017  . Insomnia 02/16/2017    Overnight changes: Weaning 02 and pressors    Consults:  Urology 03/18/2018 PCCM 03/19/2018  Procedures:  Cystoscopy with Retrograde Pyelogram/ Left Ureteral Stent Placement 03/19/2018 Endotracheally intubated  03/19/2018 Arterial line right brachial 03/19/2018 CVL R IJ  03/19/18        Significant Diagnostic Tests:    Micro Data:  MRSA PCR positive   03/18/2018 Blood cx  1/16 Pos x 2 = gram-negative rods, E. Coli pan sensitive  UC 1/16 same e coli   Antimicrobials:  Vancomycin- 03/18/2018 - 1 dose Rocephin 03/18/2018, 117 increased to 2 g IV every 24 hours>> Gentamicin 1/17 >>>    Objective   Blood pressure 135/85, pulse 64, temperature 100.2 F (37.9 C), resp. rate (!) 24, height 5\' 2"  (1.575 m), weight 97.6 kg, last menstrual period 03/01/2018, SpO2 100 %. CVP:  [14 mmHg-16 mmHg] 16 mmHg  Vent Mode: PRVC FiO2 (%):  [40 %-70 %] 40 % Set Rate:  [20 bmp-24 bmp] 24 bmp Vt Set:  [400 mL] 400 mL PEEP:  [8 cmH20] 8 cmH20 Plateau Pressure:  [21 cmH20-25 cmH20] 25 cmH20   Intake/Output Summary (Last 24 hours) at 03/20/2018 1008 Last data filed at 03/20/2018 4585 Gross per 24 hour  Intake 3657.35 ml  Output 775 ml  Net 2882.35 ml   Filed Weights   03/18/18 1137 03/20/18 0500  Weight: 79.4 kg 97.6 kg    Examination:   CVP:  [14 mmHg-16 mmHg] 16 mmHg    Pt alert, approp nad @ 30 degrees No jvd Oropharynx oral et in place Neck supple Lungs with a few scattered exp > insp rhonchi bilaterally RRR no s3 or or sign murmur Abd soft/ benign  Extr warm with no  edema or clubbing noted Neuro  Sensorium appears alert /   no apparent motor deficits     I personally reviewed images and agree with radiology impression as follows:  pCXR:  1/18 Improvement in bilateral edema pattern. Persistent bibasilar atelectasis and effusion. Support lines in good position   Assessment & Plan:  1. Acute hypoxic Respiratory Failure Pulmonary Edema   Plan: Vent bundle Wean FiO2 as able for sats greater than 94% Wean to extubate am 1/18 if possible         2. Septic Shock/ metabolic acidosis from Left uretral stone with obstruction/pyelo with e coli and enterobacer on culture - cortisol level  17.5 am 1/17 > stress steroids given - still low grade fever am 1/18 and plt down so not out of the woods quite yet    Plan: D/c'd hc03 am 1/18 Off vasopressin am 1/18  Give one more fluid bolus to be sure she can stay off levo Continue stress steroids for now  Await final culture sensitivities before d/c gent    3. Left ureteral stone with obstruction and urosepsis POD 1 Cystoscopy with Retrograde Pyelogramm/ Left Uretral Stent Placement Renal insufficiency creatinine 0.93-1.07 Plan: Per urology Avoid nephrotoxic drugs Monitor creatinine  4. Anion Gap Metabolic Acidosis 2.3 increased to 3.1  - resolved am 1/18 > try off hco3 drip am 1/18  5. Hypocalcemia/likely related to low alb  6. Thrombocytopenia likely related to sepsis - no evidence clinically for dic > continue to monitor  Lab Results  Component Value Date   PLT 64 (L) 03/20/2018   PLT 95 (L) 03/19/2018   PLT 253 03/18/2018   PLT 323 02/11/2018        Best practice:  Diet: NPO Pain/Anxiety/Delirium protocol (if indicated): fentanyl w/ prn versed VAP protocol (if indicated):  yes DVT prophylaxis: lovenox  GI prophylaxis: PPi Glucose control: BG goal <1105m Mobility: bedrest Code Status: FULL Family Communication: 03/19/2018 although patient is intubated she was updated at bedside and nods understanding. Disposition: ICU  Labs   CBC: Recent Labs  Lab 03/18/18 1343 03/19/18 0625 03/20/18 0500  WBC 4.6 27.2* 39.1*  NEUTROABS  --   --  29.8*  HGB 15.5* 11.7* 10.8*  HCT 47.7* 36.1 33.7*  MCV 90.9 92.3 92.3  PLT 253 95* 64*    Basic Metabolic Panel: Recent Labs  Lab 03/18/18 1343 03/19/18 0348 03/19/18 1620 03/20/18 0500  NA 135 139  --  145  K 4.1 3.8  --  4.5  CL 103 113*  --  108  CO2 21* 15*  --  24  GLUCOSE 85 143*  --  153*  BUN 14 12  --  19  CREATININE 0.93 1.07*  --  1.07*  CALCIUM 9.3 5.5*  --  7.2*  MG  --   --  1.4* 2.0  PHOS  --   --  3.5 2.5   GFR: Estimated Creatinine  Clearance: 82.3 mL/min (A) (by C-G formula based on SCr of 1.07 mg/dL (H)). Recent Labs  Lab 03/18/18 1343  03/19/18 0325 03/19/18 0348 03/19/18 0625 03/19/18 2201 03/20/18 0118 03/20/18 0500  PROCALCITON  --   --   --  88.13  --   --   --   --   WBC 4.6  --   --   --  27.2*  --   --  39.1*  LATICACIDVEN  --    < > 2.3*  --  3.1* 4.2* 3.9*  --    < > =  values in this interval not displayed.    Liver Function Tests: Recent Labs  Lab 03/18/18 1343 03/19/18 0348  AST 74* 43*  ALT 78* 39  ALKPHOS 100 52  BILITOT 1.4* 1.9*  PROT 8.1 4.5*  ALBUMIN 4.9 2.7*   Recent Labs  Lab 03/18/18 1343  LIPASE 29   No results for input(s): AMMONIA in the last 168 hours.  ABG    Component Value Date/Time   PHART 7.452 (H) 03/20/2018 0633   PCO2ART 33.7 03/20/2018 0633   PO2ART 106 03/20/2018 0633   HCO3 23.1 03/20/2018 0633   ACIDBASEDEF 6.1 (H) 03/19/2018 1500   O2SAT 98.2 03/20/2018 0633     Coagulation Profile: Recent Labs  Lab 03/19/18 0348  INR 1.59    Cardiac Enzymes: No results for input(s): CKTOTAL, CKMB, CKMBINDEX, TROPONINI in the last 168 hours.  HbA1C: Hgb A1c MFr Bld  Date/Time Value Ref Range Status  02/11/2018 09:00 AM 5.4 4.8 - 5.6 % Final    Comment:             Prediabetes: 5.7 - 6.4          Diabetes: >6.4          Glycemic control for adults with diabetes: <7.0     CBG: Recent Labs  Lab 03/19/18 1639 03/19/18 1937 03/19/18 2304 03/20/18 0340 03/20/18 0737  GLUCAP 122* 127* 132* 154* 135*     CODE STATUS:FULL DISPOSITION: ICU PROGNOSIS: improving  COMMUNICATION:  Boyfriend at bedside updated    The patient is critically ill with multiple organ systems failure and requires high complexity decision making for assessment and support, frequent evaluation and titration of therapies, application of advanced monitoring technologies and extensive interpretation of multiple databases. Critical Care Time devoted to patient care services  described in this note is 45 minutes.    Sandrea HughsMichael Kalayla Shadden, MD Pulmonary and Critical Care Medicine [facility] Cell [phone] After 5:30 PM or weekends, use Beeper [phone]

## 2018-03-20 NOTE — Progress Notes (Signed)
2 Days Post-Op Subjective: Patient Is alert and awake on the ventilator. No hematuria, urine concentrated.  Objective: Vital signs in last 24 hours: Temp:  [97.5 F (36.4 C)-101.1 F (38.4 C)] 97.5 F (36.4 C) (01/18 1345) Pulse Rate:  [42-160] 105 (01/18 1345) Resp:  [8-24] 11 (01/18 1345) BP: (111-135)/(76-94) 130/94 (01/18 1330) SpO2:  [83 %-100 %] 100 % (01/18 1345) Arterial Line BP: (77-125)/(49-82) 113/75 (01/18 1345) FiO2 (%):  [40 %] 40 % (01/18 1130) Weight:  [97.6 kg] 97.6 kg (01/18 0500)  Intake/Output from previous day: 01/17 0701 - 01/18 0700 In: 4562.9 [I.V.:4182.9; IV Piggyback:380] Out: 1275 [Urine:1275] Intake/Output this shift: Total I/O In: 1496.5 [I.V.:1392.4; IV Piggyback:104.1] Out: 150 [Urine:150]  Physical Exam:  Sedated and intubated, she is arousable Abd soft, NT Foley in place - urine clear   Lab Results: Recent Labs    03/18/18 1343 03/19/18 0625 03/20/18 0500  HGB 15.5* 11.7* 10.8*  HCT 47.7* 36.1 33.7*   BMET Recent Labs    03/19/18 0348 03/20/18 0500  NA 139 145  K 3.8 4.5  CL 113* 108  CO2 15* 24  GLUCOSE 143* 153*  BUN 12 19  CREATININE 1.07* 1.07*  CALCIUM 5.5* 7.2*   Recent Labs    03/19/18 0348  INR 1.59   No results for input(s): LABURIN in the last 72 hours. Results for orders placed or performed during the hospital encounter of 03/18/18  Urine culture     Status: Abnormal (Preliminary result)   Collection Time: 03/18/18  1:45 PM  Result Value Ref Range Status   Specimen Description   Final    URINE, RANDOM Performed at [facility], [address] [place], [place] [address]    Special Requests   Final    NONE Performed at [facility], [address], [place], [place] [address]    Culture 80,000 COLONIES/mL ESCHERICHIA COLI (A)  Final   Report Status PENDING  Incomplete  Wet prep, genital     Status: Abnormal   Collection Time: 03/18/18  3:36 PM  Result Value Ref  Range Status   Yeast Wet Prep HPF POC NONE SEEN NONE SEEN Final   Trich, Wet Prep NONE SEEN NONE SEEN Final   Clue Cells Wet Prep HPF POC NONE SEEN NONE SEEN Final   WBC, Wet Prep HPF POC RARE (A) NONE SEEN Final    Comment: Specimen diluted due to transport tube containing more than 1 ml of saline, interpret results with caution.   Sperm NONE SEEN  Final    Comment: Performed at [facility], [address], [place], [place] [address]  Blood Culture (routine x 2)     Status: Abnormal (Preliminary result)   Collection Time: 03/18/18  7:13 PM  Result Value Ref Range Status   Specimen Description   Final    BLOOD RIGHT FOREARM Performed at [facility], [address], [place], [place] [address]    Special Requests   Final    BOTTLES DRAWN AEROBIC AND ANAEROBIC Blood Culture adequate volume Performed at [facility], [address] [place], [place] [address]    Culture  Setup Time   Final    GRAM NEGATIVE RODS IN BOTH AEROBIC AND ANAEROBIC BOTTLES CRITICAL RESULT CALLED TO, READ BACK BY AND VERIFIED WITH: Azzie Glatter PharmD 9:50 03/19/18 (wilsonm)    Culture (A)  Final    ESCHERICHIA COLI SUSCEPTIBILITIES TO FOLLOW Performed at [facility], [address], [place], [place] [address]    Report Status PENDING  Incomplete  Blood Culture (routine x 2)     Status: None (Preliminary result)   Collection Time: 03/18/18  7:13 PM  Result Value Ref Range Status   Specimen Description BLOOD LEFT HAND  Final   Special Requests   Final    BOTTLES DRAWN AEROBIC AND ANAEROBIC Blood Culture results may not be optimal due to an inadequate volume of blood received in culture bottles   Culture  Setup Time   Final    GRAM NEGATIVE RODS IN BOTH AEROBIC AND ANAEROBIC BOTTLES CRITICAL VALUE NOTED.  VALUE IS CONSISTENT WITH PREVIOUSLY REPORTED AND CALLED VALUE. Performed at [facility], [address], [place], [place] [address]    Culture GRAM  NEGATIVE RODS  Final   Report Status PENDING  Incomplete  Blood Culture ID Panel (Reflexed)     Status: Abnormal   Collection Time: 03/18/18  7:13 PM  Result Value Ref Range Status   Enterococcus species NOT DETECTED NOT DETECTED Final   Listeria monocytogenes NOT DETECTED NOT DETECTED Final   Staphylococcus species NOT DETECTED NOT DETECTED Final   Staphylococcus aureus (BCID) NOT DETECTED NOT DETECTED Final   Streptococcus species NOT DETECTED NOT DETECTED Final   Streptococcus agalactiae NOT DETECTED NOT DETECTED Final   Streptococcus pneumoniae NOT DETECTED NOT DETECTED Final   Streptococcus pyogenes NOT DETECTED NOT DETECTED Final   Acinetobacter baumannii NOT DETECTED NOT DETECTED Final   Enterobacteriaceae species DETECTED (A) NOT DETECTED Final    Comment: Enterobacteriaceae represent a large family of gram-negative bacteria, not a single organism. CRITICAL RESULT CALLED TO, READ BACK BY AND VERIFIED WITH: Azzie Glatter PharmD 9:50 03/19/18 (wilsonm)    Enterobacter cloacae complex NOT DETECTED NOT DETECTED Final   Escherichia coli DETECTED (A) NOT DETECTED Final    Comment: CRITICAL RESULT CALLED TO, READ BACK BY AND VERIFIED WITH: Azzie Glatter PharmD 9:50 03/19/18 (wilsonm)    Klebsiella oxytoca NOT DETECTED NOT DETECTED Final   Klebsiella pneumoniae NOT DETECTED NOT DETECTED Final   Proteus species NOT DETECTED NOT DETECTED Final   Serratia marcescens NOT DETECTED NOT DETECTED Final   Carbapenem resistance NOT DETECTED NOT DETECTED Final   Haemophilus influenzae NOT DETECTED NOT DETECTED Final   Neisseria meningitidis NOT DETECTED NOT DETECTED Final   Pseudomonas aeruginosa NOT DETECTED NOT DETECTED Final   Candida albicans NOT DETECTED NOT DETECTED Final   Candida glabrata NOT DETECTED NOT DETECTED Final   Candida krusei NOT DETECTED NOT DETECTED Final   Candida parapsilosis NOT DETECTED NOT DETECTED Final   Candida tropicalis NOT DETECTED NOT DETECTED Final    Comment:  Performed at [facility], [address], [place], [place] [address]  Surgical pcr screen     Status: Abnormal   Collection Time: 03/18/18 10:51 PM  Result Value Ref Range Status   MRSA, PCR POSITIVE (A) NEGATIVE Final    Comment: RESULT CALLED TO, READ BACK BY AND VERIFIED WITH: STEWART,S RN @0045  ON 03/19/2018 JACKSON,K    Staphylococcus aureus POSITIVE (A) NEGATIVE Final    Comment: (NOTE) The Xpert SA Assay (FDA approved for NASAL specimens in patients 67 years of age and older), is one component of a comprehensive surveillance program. It is not intended to diagnose infection nor to guide or monitor treatment. Performed at [facility], [address] [place], [place] [address]   Urine culture     Status: None  Collection Time: 03/19/18  6:44 AM  Result Value Ref Range Status   Specimen Description   Final    URINE, CLEAN CATCH Performed at [facility], [address] [place], [place] [address]    Special Requests   Final    NONE Performed at [facility], [address] [place], [place] [address]    Culture   Final    NO GROWTH Performed at [facility], [address] [place], [place] [address]    Report Status 03/20/2018 FINAL  Final    Studies/Results: Dg Chest 2 View  Result Date: 03/18/2018 CLINICAL DATA:  Abdominal and LEFT flank pain since this morning EXAM: CHEST - 2 VIEW COMPARISON:  None FINDINGS: Borderline enlargement of cardiac silhouette. Mediastinal contours and pulmonary vascularity normal. RIGHT basilar atelectasis and generally low lung volumes. Upper lungs clear. No pneumothorax. Bones unremarkable. IMPRESSION: Low lung volumes with accentuated heart size and RIGHT basilar atelectasis. Electronically Signed   By: Ulyses SouthwardMark  Boles M.D.   On: 03/18/2018 20:06   Dg Abd 1 View  Result Date: 03/19/2018 CLINICAL DATA:  Orogastric tube placement EXAM: ABDOMEN - 1 VIEW COMPARISON:   Portable exam 1032 hours compared to CT abdomen and pelvis 03/18/2017 FINDINGS: Tip of orogastric tube projects over mid stomach. Tip of central venous catheter projects over cavoatrial junction. Bibasilar atelectasis versus consolidation greater on LEFT. Nonobstructive bowel gas pattern. LEFT ureteral stent noted. IMPRESSION: Tip of orogastric tube projects over mid stomach. Electronically Signed   By: Ulyses SouthwardMark  Boles M.D.   On: 03/19/2018 11:02   Ct Abdomen Pelvis W Contrast  Result Date: 03/18/2018 CLINICAL DATA:  Patient with left flank pain. EXAM: CT ABDOMEN AND PELVIS WITH CONTRAST TECHNIQUE: Multidetector CT imaging of the abdomen and pelvis was performed using the standard protocol following bolus administration of intravenous contrast. CONTRAST:  100mL ISOVUE-300 IOPAMIDOL (ISOVUE-300) INJECTION 61% COMPARISON:  None. FINDINGS: Lower chest: Normal heart size. Small hiatal hernia. Dependent atelectasis within the left lower lobe. No pleural effusion. Hepatobiliary: Liver is normal in size and contour. No focal hepatic lesions identified. Gallbladder is unremarkable. No intrahepatic or extrahepatic biliary ductal dilatation. Pancreas: Unremarkable Spleen: Unremarkable Adrenals/Urinary Tract: Normal adrenal glands. There is delayed enhancement of the left kidney and moderate left hydroureteronephrosis to the level of the distal left ureter were there is an obstructing 4 mm stone (image 77; series 2). Urinary bladder is unremarkable. Stomach/Bowel: Transverse and descending colon is decompressed, limiting evaluation. Small bowel feces within the distal small bowel. No free fluid or free intraperitoneal air. Vascular/Lymphatic: Normal caliber abdominal aorta. No retroperitoneal lymphadenopathy. Reproductive: Uterus is unremarkable. Adnexal structures unremarkable. Other: None. Musculoskeletal: Lumbar spine degenerative changes. No aggressive or acute appearing osseous lesions. IMPRESSION: There is an obstructing  4 mm stone within the distal left ureter resulting in mild to moderate left hydroureteronephrosis. Small bowel feces within the distal small bowel which may be secondary to slow transient. Electronically Signed   By: Annia Beltrew  Davis M.D.   On: 03/18/2018 18:23   [place] Renal  Result Date: 03/19/2018 CLINICAL DATA:  Shock circulatory. EXAM: RENAL / URINARY TRACT ULTRASOUND COMPLETE COMPARISON:  CT scan of March 18, 2018. FINDINGS: Right Kidney: Renal measurements: 12.6 x 5.1 x 4.5 cm = volume: 152 mL . Echogenicity within normal limits. No mass or hydronephrosis visualized. Left Kidney: Renal measurements: 12.2 x 5.7 x 5.5 cm = volume: 200 mL. Echogenicity within normal limits. No mass or hydronephrosis visualized. Bladder: Appears normal for  degree of bladder distention. Foley catheter is noted. IMPRESSION: Normal renal ultrasound. Left hydronephrosis noted on prior CT scan is not visualized currently. Electronically Signed   By: Lupita Raider, M.D.   On: 03/19/2018 10:15   Dg Chest Port 1 View  Result Date: 03/20/2018 CLINICAL DATA:  Respiratory failure EXAM: PORTABLE CHEST 1 VIEW COMPARISON:  03/19/2018 FINDINGS: Endotracheal tube in good position. Right jugular central venous catheter tip cavoatrial junction unchanged. NG tube in place. Mild improvement in bilateral airspace disease. Small bilateral effusions unchanged. IMPRESSION: Improvement in bilateral edema pattern. Persistent bibasilar atelectasis and effusion. Support lines in good position. Electronically Signed   By: Marlan Palau M.D.   On: 03/20/2018 06:42   Dg Chest Port 1 View  Result Date: 03/19/2018 CLINICAL DATA:  Post intubation, ETT position EXAM: PORTABLE CHEST 1 VIEW COMPARISON:  03/19/2018 at 0103 hours FINDINGS: Endotracheal tube terminates 18 mm above the carina. Stable right IJ venous catheter terminating at the cavoatrial junction. Mild interstitial edema. Right infrahilar opacity, favoring edema, less likely infection. Suspected  small bilateral pleural effusions, right greater than left. No pneumothorax. The heart is top-normal in size. IMPRESSION: Endotracheal tube terminates 18 mm above the carina. Mild interstitial edema with small bilateral pleural effusions, right greater than left. Right infrahilar opacity, favoring edema, less likely infection. Electronically Signed   By: Charline Bills M.D.   On: 03/19/2018 02:51   Dg Chest Port 1 View  Result Date: 03/19/2018 CLINICAL DATA:  Postop EXAM: PORTABLE CHEST 1 VIEW COMPARISON:  03/18/2018 FINDINGS: Left upper lobe and right lower lobe opacities, favoring perihilar edema, less likely mild infection. No definite pleural effusions. The heart is normal in size. Right IJ venous catheter terminates at the cavoatrial junction. IMPRESSION: Right IJ venous catheter terminates at the cavoatrial junction. Electronically Signed   By: Charline Bills M.D.   On: 03/19/2018 02:03   Dg C-arm 1-60 Min-no Report  Result Date: 03/19/2018 Fluoroscopy was utilized by the requesting physician.  No radiographic interpretation.    Assessment/Plan: --Left distal ureteral stone with hydronephrosis-status post left ureteral stent.  She'll ultimately need left ureteroscopy after 2 weeks of culture specific antibiotics. --Melene Muller continues to receive care from critical care. Continue broad spectrum antibiotics pending urine culture   LOS: 2 days   Wilkie Aye 03/20/2018, 5:10 PM

## 2018-03-20 NOTE — Procedures (Signed)
Extubation Procedure Note  Patient Details:   Name: Hillarie Wiswall DOB: 1985-10-08 MRN: [id number]   Airway Documentation:    Vent end date: 03/20/18 Vent end time: 1155(Pt placed on 4 lpm [place])   Evaluation  O2 sats: stable throughout Complications: No apparent complications Patient did tolerate procedure well. Bilateral Breath Sounds: Clear, Diminished   Pt placed on 4 lpm [place] Pt able to speak.  Mercy Riding, Somaya Grassi L 03/20/2018, 12:11 PM

## 2018-03-21 LAB — BASIC METABOLIC PANEL
Anion gap: 8 (ref 5–15)
BUN: 21 mg/dL — ABNORMAL HIGH (ref 6–20)
CALCIUM: 7.6 mg/dL — AB (ref 8.9–10.3)
CO2: 26 mmol/L (ref 22–32)
Chloride: 108 mmol/L (ref 98–111)
Creatinine, Ser: 0.8 mg/dL (ref 0.44–1.00)
GFR calc Af Amer: >60 mL/min (ref >60)
GFR calc non Af Amer: >60 mL/min (ref >60)
Glucose, Bld: 111 mg/dL — ABNORMAL HIGH (ref 70–99)
Potassium: 4.1 mmol/L (ref 3.5–5.1)
Sodium: 142 mmol/L (ref 135–145)

## 2018-03-21 LAB — CULTURE, BLOOD (ROUTINE X 2): Special Requests: ADEQUATE

## 2018-03-21 LAB — CBC
HCT: 31.7 % — ABNORMAL LOW (ref 36.0–46.0)
Hemoglobin: 10.1 g/dL — ABNORMAL LOW (ref 12.0–15.0)
MCH: 30.1 pg (ref 26.0–34.0)
MCHC: 31.9 g/dL (ref 30.0–36.0)
MCV: 94.6 fL (ref 80.0–100.0)
Platelets: 77 10*3/uL — ABNORMAL LOW (ref 150–400)
RBC: 3.35 MIL/uL — ABNORMAL LOW (ref 3.87–5.11)
RDW: 14.1 % (ref 11.5–15.5)
WBC: 46.7 10*3/uL — ABNORMAL HIGH (ref 4.0–10.5)
nRBC: 0.1 % (ref 0.0–0.2)

## 2018-03-21 LAB — GLUCOSE, CAPILLARY
Glucose-Capillary: 94 mg/dL (ref 70–99)
Glucose-Capillary: 115 mg/dL — ABNORMAL HIGH (ref 70–99)
Glucose-Capillary: 145 mg/dL — ABNORMAL HIGH (ref 70–99)
Glucose-Capillary: 137 mg/dL — ABNORMAL HIGH (ref 70–99)
Glucose-Capillary: 106 mg/dL — ABNORMAL HIGH (ref 70–99)

## 2018-03-21 LAB — PHOSPHORUS: Phosphorus: 2.1 mg/dL — ABNORMAL LOW (ref 2.5–4.6)

## 2018-03-21 LAB — URINE CULTURE

## 2018-03-21 LAB — MAGNESIUM: Magnesium: 2.3 mg/dL (ref 1.7–2.4)

## 2018-03-21 MED ORDER — DIAZEPAM 5 MG PO TABS
5.0000 mg | ORAL_TABLET | Freq: Every evening | ORAL | Status: DC | PRN
  Administered 2018-03-21: 5 mg via ORAL
  Filled 2018-03-21: qty 1

## 2018-03-21 MED ORDER — ACETAMINOPHEN 325 MG PO TABS
650.0000 mg | ORAL_TABLET | Freq: Four times a day (QID) | ORAL | Status: DC | PRN
  Administered 2018-03-21 – 2018-03-22 (×4): 650 mg via ORAL
  Filled 2018-03-21 (×4): qty 2

## 2018-03-21 MED ORDER — SODIUM CHLORIDE 0.9% FLUSH
10.0000 mL | Freq: Two times a day (BID) | INTRAVENOUS | Status: DC
  Administered 2018-03-21 – 2018-03-26 (×11): 10 mL

## 2018-03-21 MED ORDER — CHLORHEXIDINE GLUCONATE CLOTH 2 % EX PADS
6.0000 | MEDICATED_PAD | Freq: Every day | CUTANEOUS | Status: DC
  Administered 2018-03-21 – 2018-03-24 (×3): 6 via TOPICAL

## 2018-03-21 MED ORDER — HYDROCORTISONE NA SUCCINATE PF 100 MG IJ SOLR
50.0000 mg | Freq: Two times a day (BID) | INTRAMUSCULAR | Status: DC
  Administered 2018-03-21 – 2018-03-22 (×2): 50 mg via INTRAVENOUS
  Filled 2018-03-21 (×2): qty 2

## 2018-03-21 MED ORDER — SODIUM CHLORIDE 0.9% FLUSH: 10.0000 mL | INTRAVENOUS | Status: DC | PRN

## 2018-03-21 MED ORDER — ALPRAZOLAM 0.5 MG PO TABS: 0.5000 mg | ORAL_TABLET | Freq: Every evening | ORAL | Status: DC | PRN

## 2018-03-21 MED ORDER — PANTOPRAZOLE SODIUM 40 MG PO TBEC
40.0000 mg | DELAYED_RELEASE_TABLET | Freq: Every day | ORAL | Status: DC
  Administered 2018-03-21 – 2018-03-22 (×2): 40 mg via ORAL
  Filled 2018-03-21 (×2): qty 1

## 2018-03-21 MED ORDER — NOREPINEPHRINE BITARTRATE 1 MG/ML IV SOLN
0.0000 ug/min | INTRAVENOUS | Status: DC
  Administered 2018-03-21: 2 ug/min via INTRAVENOUS
  Filled 2018-03-21: qty 4

## 2018-03-21 NOTE — Progress Notes (Signed)
While in patient's room at 2030, patient stated she was "hallunicating and seeing the guy from breaking bad standing behind me". MD made aware prior to giving PRN dose of valium. Will continue to monitor.

## 2018-03-21 NOTE — Progress Notes (Addendum)
..   NAME:  Stephanie Patrick, MRN:  [id number], DOB:  01-20-1986, LOS: 3 ADMISSION DATE:  03/18/2018, CONSULTATION DATE:  03/19/2018 REFERRING MD:  , CHIEF COMPLAINT:  Acute hypoxic resp failure   Brief History   33 yo female with PMHx sig for vit D def, recurrent UTI presented to [facility] on 1/16 with left flank pain found to have a Left ureteral stone with obstruction > emergent stent drained pus from L kidney but pt deteriorated with gm neg septic shock and ccm service requested.  History of present illness   (Obtained from EMR review, reports from Rn and acct of other provider)  33 yr old female with PMHx for Depression, Vitamin D Deficiency, recurrent  UTIs resents to [facility] ED for left flank pain. 1/16  She was found to be septic with a temperature of 105 and tachycardia.  Her urine showed many bacteria, 0-5 whites and 0-5 reds.  White count and creatinine normal.  CT scan revealed a 5 mm distal left ureteral stone but there was proximal mild - moderate hydronephrosis and decreased enhancement from the left kidney consistent with high-grade obstruction.  The left kidney may also have some edema. A foley was placed for a bladder scan ~ 500 and pt couldn't void with left flank pain found to have a Left ureteral stone with obstruction. Pt received a Cystoscopy with Retrograde Pyelogramm/ Left Uretral Stent Placement post op pt required pressors and increased PEEP due to hypotension and hypoxia.   Past Medical History  .Marland Kitchen Active Ambulatory Problems    Diagnosis Date Noted  . Recurrent UTI 02/13/2017  . Depression 02/13/2017  . ADHD 02/13/2017  . Migraines 02/16/2017  . Vitamin D deficiency 02/16/2017  . Preventative health care 02/16/2017  . Insomnia 02/16/2017    Overnight changes: Able to wean off pressors     Consults:  Urology 03/18/2018 PCCM 03/19/2018  Procedures:  Cystoscopy with Retrograde Pyelogram/ Left Ureteral Stent Placement 03/19/2018 Endotracheally intubated  03/19/2018 Arterial line right brachial 03/19/2018 CVL R IJ  03/19/18        Significant Diagnostic Tests:    Micro Data:  MRSA PCR positive   03/18/2018 Blood cx  1/16 Pos x 2 = gram-negative rods, E. Coli pan sensitive . Enterobacter>>> UC 1/16 same e coli   Antimicrobials:  Vancomycin- 03/18/2018 x 1 dose  Rocephin 03/18/2018, 117 increased to 2 g IV every 24 hours>> Gentamicin 1/17 >>>    Objective   Blood pressure (!) 130/94, pulse 90, temperature 99.5 F (37.5 C), resp. rate 13, height 5\' 2"  (1.575 m), weight 98.1 kg, last menstrual period 03/01/2018, SpO2 95 %. CVP:  [12 mmHg-14 mmHg] 14 mmHg  FiO2 (%):  [40 %] 40 %  On 3lpm NP   Intake/Output Summary (Last 24 hours) at 03/21/2018 0838 Last data filed at 03/21/2018 0400 Gross per 24 hour  Intake 1962.88 ml  Output 850 ml  Net 1112.88 ml   Filed Weights   03/18/18 1137 03/20/18 0500 03/21/18 0430  Weight: 79.4 kg 97.6 kg 98.1 kg    Examination:   CVP:  [12 mmHg-14 mmHg] 14 mmHg     Pt alert, approp nad @ 30 degrees On 3lpm NP  No jvd Oropharynx clear,  mucosa nl/ mod hoarse Neck supple Lungs with clear  bilaterally RRR no s3 or or sign murmur Abd obese with nl excursion  Extr warm with no edema or clubbing noted Neuro  Sensorium intact,  no apparent motor deficits    Assessment &  Plan:  1. Acute hypoxic Respiratory Failure Pulmonary Edema/ prob ali from gm neg sepsis   Plan: Wean NP off for sats greater than 94%        2. Septic Shock/ metabolic acidosis from Left uretral stone with obstruction/pyelo with e coli and enterobacer on culture - cortisol level 17.5 am 1/17 > stress steroids given -  No fever x 24 as of am 1/19   Plan: D/c'd hc03 am 1/18 Off vasopressin am 1/18 / off levo am 1/19  Wean stress steroids starting am 1/19 Await final culture sensitivities before d/c gent    3. Left ureteral stone with obstruction and urosepsis  Cystoscopy with Retrograde Pyelogramm/ Left Uretral Stent  Placement 1/16 Renal insufficiency creatinine 0.93-1.07 Plan: Per urology Avoid nephrotoxic drugs Monitor creatinine  4. Anion Gap Metabolic Acidosis    - resolved am 1/18 >d/c'd hco3 drip am 1/18  5. Hypocalcemia/likely related to low alb - no rx indicated am 1/19   6. Thrombocytopenia likely related to sepsis - no evidence clinically for dic > continue to monitor  Lab Results  Component Value Date   PLT 77 (L) 03/21/2018   PLT 64 (L) 03/20/2018   PLT 95 (L) 03/19/2018   PLT 323 02/11/2018     Has probably nadired    Best practice:  Diet: advance diet as tol  Pain/Anxiety/Delirium protocol (if indicated): wean all sedation/ narcs as tol VAP protocol (if indicated):  N/a  DVT prophylaxis: lovenox  GI prophylaxis: PPi Glucose control: BG goal <168m Mobility: dangle then up in chair if tol  Code Status: FULL Family Communication: full fm at bedside am 1/19 updated Disposition: ICU > change to step down status   Labs   CBC: Recent Labs  Lab 03/18/18 1343 03/19/18 0625 03/20/18 0500 03/21/18 0300  WBC 4.6 27.2* 39.1* 46.7*  NEUTROABS  --   --  29.8*  --   HGB 15.5* 11.7* 10.8* 10.1*  HCT 47.7* 36.1 33.7* 31.7*  MCV 90.9 92.3 92.3 94.6  PLT 253 95* 64* 77*    Basic Metabolic Panel: Recent Labs  Lab 03/18/18 1343 03/19/18 0348 03/19/18 1620 03/20/18 0500 03/20/18 1709 03/21/18 0300  NA 135 139  --  145  --  142  K 4.1 3.8  --  4.5  --  4.1  CL 103 113*  --  108  --  108  CO2 21* 15*  --  24  --  26  GLUCOSE 85 143*  --  153*  --  111*  BUN 14 12  --  19  --  21*  CREATININE 0.93 1.07*  --  1.07*  --  0.80  CALCIUM 9.3 5.5*  --  7.2*  --  7.6*  MG  --   --  1.4* 2.0 2.1 2.3  PHOS  --   --  3.5 2.5 1.9* 2.1*   GFR: Estimated Creatinine Clearance: 110.4 mL/min (by C-G formula based on SCr of 0.8 mg/dL). Recent Labs  Lab 03/18/18 1343  03/19/18 0325 03/19/18 0348 03/19/18 0625 03/19/18 2201 03/20/18 0118 03/20/18 0500 03/21/18 0300   PROCALCITON  --   --   --  88.13  --   --   --   --   --   WBC 4.6  --   --   --  27.2*  --   --  39.1* 46.7*  LATICACIDVEN  --    < > 2.3*  --  3.1* 4.2* 3.9*  --   --    < > =  values in this interval not displayed.    Liver Function Tests: Recent Labs  Lab 03/18/18 1343 03/19/18 0348  AST 74* 43*  ALT 78* 39  ALKPHOS 100 52  BILITOT 1.4* 1.9*  PROT 8.1 4.5*  ALBUMIN 4.9 2.7*   Recent Labs  Lab 03/18/18 1343  LIPASE 29   No results for input(s): AMMONIA in the last 168 hours.  ABG    Component Value Date/Time   PHART 7.452 (H) 03/20/2018 0633   PCO2ART 33.7 03/20/2018 0633   PO2ART 106 03/20/2018 0633   HCO3 23.1 03/20/2018 0633   ACIDBASEDEF 6.1 (H) 03/19/2018 1500   O2SAT 98.2 03/20/2018 0633     Coagulation Profile: Recent Labs  Lab 03/19/18 0348  INR 1.59    Cardiac Enzymes: No results for input(s): CKTOTAL, CKMB, CKMBINDEX, TROPONINI in the last 168 hours.  HbA1C: Hgb A1c MFr Bld  Date/Time Value Ref Range Status  02/11/2018 09:00 AM 5.4 4.8 - 5.6 % Final    Comment:             Prediabetes: 5.7 - 6.4          Diabetes: >6.4          Glycemic control for adults with diabetes: <7.0     CBG: Recent Labs  Lab 03/20/18 1530 03/20/18 1920 03/20/18 2311 03/21/18 0401 03/21/18 0744  GLUCAP 97 103* 96 94 115*          Sandrea Hughs, MD Pulmonary and Critical Care Medicine [facility] Cell [phone] After 5:30 PM or weekends, use Beeper [phone]

## 2018-03-21 NOTE — Progress Notes (Addendum)
eLink Physician-Brief Progress Note Patient Name: Stephanie Patrick DOB: Apr 17, 1985 MRN: [id number]   Date of Service  03/21/2018  HPI/Events of Note  32/F with gram negative septic shock.  Pt requesting for sleep aide.  She feels very anxious.  She is also complaining of chest heaviness.  She tolerated diazepam in the past, had an idiosyncratic reaction to xanax.  eICU Interventions  Diazepam ordered prn.  Check EKG.     Intervention Category Minor Interventions: Agitation / anxiety - evaluation and management  Larinda Buttery 03/21/2018, 7:47 PM    8:44 PM Made aware

## 2018-03-22 LAB — CBC WITH DIFFERENTIAL/PLATELET
Abs Immature Granulocytes: 3.29 10*3/uL — ABNORMAL HIGH (ref 0.00–0.07)
BASOS ABS: 0 10*3/uL (ref 0.0–0.1)
Basophils Relative: 0 %
EOS PCT: 0 %
Eosinophils Absolute: 0 10*3/uL (ref 0.0–0.5)
HCT: 36.6 % (ref 36.0–46.0)
Hemoglobin: 11.7 g/dL — ABNORMAL LOW (ref 12.0–15.0)
Immature Granulocytes: 6 %
Lymphocytes Relative: 7 %
Lymphs Abs: 3.9 10*3/uL (ref 0.7–4.0)
MCH: 30 pg (ref 26.0–34.0)
MCHC: 32 g/dL (ref 30.0–36.0)
MCV: 93.8 fL (ref 80.0–100.0)
MONO ABS: 2 10*3/uL — AB (ref 0.1–1.0)
Monocytes Relative: 4 %
Neutro Abs: 46.5 10*3/uL — ABNORMAL HIGH (ref 1.7–7.7)
Neutrophils Relative %: 83 %
Platelets: 125 10*3/uL — ABNORMAL LOW (ref 150–400)
RBC: 3.9 MIL/uL (ref 3.87–5.11)
RDW: 14 % (ref 11.5–15.5)
WBC: 55.7 10*3/uL — AB (ref 4.0–10.5)
nRBC: 0.4 % — ABNORMAL HIGH (ref 0.0–0.2)

## 2018-03-22 LAB — BASIC METABOLIC PANEL
Anion gap: 8 (ref 5–15)
BUN: 20 mg/dL (ref 6–20)
CO2: 27 mmol/L (ref 22–32)
Calcium: 8 mg/dL — ABNORMAL LOW (ref 8.9–10.3)
Chloride: 105 mmol/L (ref 98–111)
Creatinine, Ser: 0.73 mg/dL (ref 0.44–1.00)
GFR calc Af Amer: >60 mL/min (ref >60)
Glucose, Bld: 153 mg/dL — ABNORMAL HIGH (ref 70–99)
POTASSIUM: 3.4 mmol/L — AB (ref 3.5–5.1)
SODIUM: 140 mmol/L (ref 135–145)

## 2018-03-22 LAB — HEPATIC FUNCTION PANEL
ALT: 321 U/L — ABNORMAL HIGH (ref 0–44)
AST: 92 U/L — ABNORMAL HIGH (ref 15–41)
Albumin: 2.5 g/dL — ABNORMAL LOW (ref 3.5–5.0)
Alkaline Phosphatase: 149 U/L — ABNORMAL HIGH (ref 38–126)
Bilirubin, Direct: 0.7 mg/dL — ABNORMAL HIGH (ref 0.0–0.2)
Indirect Bilirubin: 0.8 mg/dL (ref 0.3–0.9)
Total Bilirubin: 1.5 mg/dL — ABNORMAL HIGH (ref 0.3–1.2)
Total Protein: 5.4 g/dL — ABNORMAL LOW (ref 6.5–8.1)

## 2018-03-22 LAB — GLUCOSE, CAPILLARY
GLUCOSE-CAPILLARY: 145 mg/dL — AB (ref 70–99)
GLUCOSE-CAPILLARY: 107 mg/dL — AB (ref 70–99)
Glucose-Capillary: 102 mg/dL — ABNORMAL HIGH (ref 70–99)
Glucose-Capillary: 112 mg/dL — ABNORMAL HIGH (ref 70–99)
Glucose-Capillary: 112 mg/dL — ABNORMAL HIGH (ref 70–99)
Glucose-Capillary: 115 mg/dL — ABNORMAL HIGH (ref 70–99)
Glucose-Capillary: 117 mg/dL — ABNORMAL HIGH (ref 70–99)

## 2018-03-22 MED ORDER — LORAZEPAM 1 MG PO TABS
1.0000 mg | ORAL_TABLET | Freq: Every day | ORAL | Status: AC
  Administered 2018-03-22 – 2018-03-24 (×3): 1 mg via ORAL
  Filled 2018-03-22 (×3): qty 1

## 2018-03-22 MED ORDER — ORAL CARE MOUTH RINSE
15.0000 mL | Freq: Two times a day (BID) | OROMUCOSAL | Status: DC
  Administered 2018-03-24 – 2018-03-25 (×3): 15 mL via OROMUCOSAL

## 2018-03-22 MED ORDER — FAMOTIDINE 20 MG PO TABS
20.0000 mg | ORAL_TABLET | Freq: Two times a day (BID) | ORAL | Status: AC
  Administered 2018-03-23 – 2018-03-25 (×6): 20 mg via ORAL
  Filled 2018-03-22 (×6): qty 1

## 2018-03-22 NOTE — Progress Notes (Addendum)
..   NAME:  Stephanie Patrick, MRN:  [id number], DOB:  1985/10/24, LOS: 4 ADMISSION DATE:  03/18/2018, CONSULTATION DATE:  03/19/2018 REFERRING MD:  , CHIEF COMPLAINT:  Acute hypoxic resp failure   Brief History   33 yo female with PMHx sig for vit D def, recurrent UTI presented to [facility] on 1/16 with left flank pain found to have a Left ureteral stone with obstruction > emergent stent drained pus from L kidney but pt deteriorated with gm neg septic shock and ccm service requested. Off vent Off pressors  History of present illness    33 yr old female with PMHx for Depression, Vitamin D Deficiency, recurrent  UTIs resents to [facility] ED for left flank pain. 1/16  She was found to be septic with a temperature of 105 and tachycardia.  Her urine showed many bacteria, 0-5 whites and 0-5 reds.  White count and creatinine normal.  CT scan revealed a 5 mm distal left ureteral stone but there was proximal mild - moderate hydronephrosis and decreased enhancement from the left kidney consistent with high-grade obstruction.  The left kidney may also have some edema. A foley was placed for a bladder scan ~ 500 and pt couldn't void with left flank pain found to have a Left ureteral stone with obstruction. Pt received a Cystoscopy with Retrograde Pyelogramm/ Left Uretral Stent Placement post op pt required pressors and increased PEEP due to hypotension and hypoxia.   Past Medical History  .Marland Kitchen Active Ambulatory Problems    Diagnosis Date Noted  . Recurrent UTI 02/13/2017  . Depression 02/13/2017  . ADHD 02/13/2017  . Migraines 02/16/2017  . Vitamin D deficiency 02/16/2017  . Preventative health care 02/16/2017  . Insomnia 02/16/2017    Overnight changes: Off pressors Off the vent   Consults:  Urology 03/18/2018 PCCM 03/19/2018  Procedures:  Cystoscopy with Retrograde Pyelogram/ Left Ureteral Stent Placement 03/19/2018 Endotracheally intubated 03/19/2018 Arterial line right brachial 03/19/2018 CVL R IJ   03/19/18      Significant Diagnostic Tests:    Micro Data:  MRSA PCR positive   03/18/2018 Blood cx  1/16 Pos x 2 = gram-negative rods, E. Coli pan sensitive . Enterobacter>>> UC 1/16 same e coli   Antimicrobials:  Vancomycin- 03/18/2018 x 1 dose  Rocephin 03/18/2018, 117 increased to 2 g IV every 24 hours>> Gentamicin 1/17 >>>    Objective   Blood pressure (!) 130/94, pulse (!) 57, temperature 98.9 F (37.2 C), temperature source Oral, resp. rate (!) 21, height 5\' 2"  (1.575 m), weight 99.9 kg, last menstrual period 03/01/2018, SpO2 93 %. CVP:  [10 mmHg-57 mmHg] 11 mmHg  FiO2 (%):  [55 %] 55 %  On 3lpm NP   Intake/Output Summary (Last 24 hours) at 03/22/2018 1037 Last data filed at 03/22/2018 0953 Gross per 24 hour  Intake 124.22 ml  Output 750 ml  Net -625.78 ml   Filed Weights   03/20/18 0500 03/21/18 0430 03/22/18 0500  Weight: 97.6 kg 98.1 kg 99.9 kg    Examination:   CVP:  [10 mmHg-57 mmHg] 11 mmHg   Pt alert, approp nad @ 30 degrees, on oxygen supplementation Oropharynx clear,  mucosa nl/ mod hoarse Neck supple, no JVD, no thyromegaly Lungs-occasional rhonchi RRR no s3 or or sign murmur Abd obese with nl excursion  Extr warm with no edema or clubbing noted Neuro  Sensorium intact,  no apparent motor deficits    Assessment & Plan:  1. Acute hypoxic Respiratory Failure Pulmonary Edema/ prob  ali from gm neg sepsis   Plan: Continue oxygen supplementation Continue other supportive measures     2. Septic Shock/ metabolic acidosis from Left uretral stone with obstruction/pyelo with e coli and enterobacer on culture - cortisol level 17.5 am 1/17 > stress steroids given -  No fever x 48hrs   Plan: Off vasopressin am 1/18 / off levo am 1/19 Await final culture sensitivities before d/c gent Will discontinue steroids   3. Left ureteral stone with obstruction and urosepsis  Cystoscopy with Retrograde Pyelogramm/ Left Uretral Stent Placement 1/16 Renal  insufficiency creatinine 0.93-1.07 Plan: Per urology Avoid nephrotoxic drugs Monitor creatinine-creatinine down to 0.8  4. Anion Gap Metabolic Acidosis    - resolved am 1/18   5. Hypocalcemia/likely related to low alb - no rx indicated am 1/19   6. Thrombocytopenia likely related to sepsis - no evidence clinically for dic > continue to monitor -Platelet count is improving  7.  Leukocytosis may be on account of gram-negative sepsis and steroids -We will continue to monitor closely  8.  Sleep onset insomnia/sleep maintenance insomnia -Low dose of Ativan tonight-1 mg nightly  Critical care time spent evaluating and reviewing records, formulating plan of care- 30mins  Lab Results  Component Value Date   PLT 77 (L) 03/21/2018   PLT 64 (L) 03/20/2018   PLT 95 (L) 03/19/2018   PLT 323 02/11/2018     Has probably nadired    Best practice:  Diet: advance diet as tol  Pain/Anxiety/Delirium protocol (if indicated): wean all sedation/ narcs as tol VAP protocol (if indicated):  N/a  DVT prophylaxis: lovenox  GI prophylaxis: PPi Glucose control: BG goal <12329m Mobility: dangle then up in chair if tol  Code Status: FULL Family Communication: full fm at bedside am 1/20 updated Disposition: ICU > change to step down status  Has been hemodynamically stable-we will discontinue arterial line  Labs   CBC: Recent Labs  Lab 03/18/18 1343 03/19/18 0625 03/20/18 0500 03/21/18 0300  WBC 4.6 27.2* 39.1* 46.7*  NEUTROABS  --   --  29.8*  --   HGB 15.5* 11.7* 10.8* 10.1*  HCT 47.7* 36.1 33.7* 31.7*  MCV 90.9 92.3 92.3 94.6  PLT 253 95* 64* 77*    Basic Metabolic Panel: Recent Labs  Lab 03/18/18 1343 03/19/18 0348 03/19/18 1620 03/20/18 0500 03/20/18 1709 03/21/18 0300  NA 135 139  --  145  --  142  K 4.1 3.8  --  4.5  --  4.1  CL 103 113*  --  108  --  108  CO2 21* 15*  --  24  --  26  GLUCOSE 85 143*  --  153*  --  111*  BUN 14 12  --  19  --  21*  CREATININE 0.93  1.07*  --  1.07*  --  0.80  CALCIUM 9.3 5.5*  --  7.2*  --  7.6*  MG  --   --  1.4* 2.0 2.1 2.3  PHOS  --   --  3.5 2.5 1.9* 2.1*   GFR: Estimated Creatinine Clearance: 111.6 mL/min (by C-G formula based on SCr of 0.8 mg/dL). Recent Labs  Lab 03/18/18 1343  03/19/18 0325 03/19/18 0348 03/19/18 0625 03/19/18 2201 03/20/18 0118 03/20/18 0500 03/21/18 0300  PROCALCITON  --   --   --  88.13  --   --   --   --   --   WBC 4.6  --   --   --  27.2*  --   --  39.1* 46.7*  LATICACIDVEN  --    < > 2.3*  --  3.1* 4.2* 3.9*  --   --    < > = values in this interval not displayed.    Liver Function Tests: Recent Labs  Lab 03/18/18 1343 03/19/18 0348  AST 74* 43*  ALT 78* 39  ALKPHOS 100 52  BILITOT 1.4* 1.9*  PROT 8.1 4.5*  ALBUMIN 4.9 2.7*    No results for input(s): AMMONIA in the last 168 hours.  ABG    Component Value Date/Time   PHART 7.452 (H) 03/20/2018 0633   PCO2ART 33.7 03/20/2018 0633   PO2ART 106 03/20/2018 0633   HCO3 23.1 03/20/2018 0633   ACIDBASEDEF 6.1 (H) 03/19/2018 1500   O2SAT 98.2 03/20/2018 0633     Coagulation Profile: Recent Labs  Lab 03/19/18 0348  INR 1.59    Cardiac Enzymes: No results for input(s): CKTOTAL, CKMB, CKMBINDEX, TROPONINI in the last 168 hours.  HbA1C: Hgb A1c MFr Bld  Date/Time Value Ref Range Status  02/11/2018 09:00 AM 5.4 4.8 - 5.6 % Final    Comment:             Prediabetes: 5.7 - 6.4          Diabetes: >6.4          Glycemic control for adults with diabetes: <7.0     CBG: Recent Labs  Lab 03/21/18 1541 03/21/18 1928 03/21/18 2341 03/22/18 0335 03/22/18 0727  GLUCAP 137* 106* 117* 112* 112*

## 2018-03-22 NOTE — Progress Notes (Signed)
Yellowing of lateral edges of sclera noted. MD aware. Hepatic function panel ordered.

## 2018-03-22 NOTE — Progress Notes (Signed)
CRITICAL VALUE ALERT  Critical Value: WBC=55.7  Date & Time Notied:  12:30pm  Provider Notified: yes Orders Received/Actions taken: Continue to monitor.Repeat BMP in the morning.

## 2018-03-23 ENCOUNTER — Encounter (HOSPITAL_COMMUNITY): Payer: Self-pay | Admitting: Cardiology

## 2018-03-23 ENCOUNTER — Inpatient Hospital Stay (HOSPITAL_COMMUNITY): Payer: Self-pay

## 2018-03-23 DIAGNOSIS — N200 Calculus of kidney: Secondary | ICD-10-CM

## 2018-03-23 LAB — CBC WITH DIFFERENTIAL/PLATELET
ABS IMMATURE GRANULOCYTES: 3.63 10*3/uL — AB (ref 0.00–0.07)
Basophils Absolute: 0 10*3/uL (ref 0.0–0.1)
Basophils Relative: 0 %
EOS PCT: 1 %
Eosinophils Absolute: 0.3 10*3/uL (ref 0.0–0.5)
HCT: 33.8 % — ABNORMAL LOW (ref 36.0–46.0)
Hemoglobin: 10.8 g/dL — ABNORMAL LOW (ref 12.0–15.0)
Immature Granulocytes: 11 %
Lymphocytes Relative: 13 %
Lymphs Abs: 4.4 10*3/uL — ABNORMAL HIGH (ref 0.7–4.0)
MCH: 29.2 pg (ref 26.0–34.0)
MCHC: 32 g/dL (ref 30.0–36.0)
MCV: 91.4 fL (ref 80.0–100.0)
Monocytes Absolute: 1.6 10*3/uL — ABNORMAL HIGH (ref 0.1–1.0)
Monocytes Relative: 5 %
Neutro Abs: 22.7 10*3/uL — ABNORMAL HIGH (ref 1.7–7.7)
Neutrophils Relative %: 70 %
Platelets: 148 10*3/uL — ABNORMAL LOW (ref 150–400)
RBC: 3.7 MIL/uL — AB (ref 3.87–5.11)
RDW: 13.7 % (ref 11.5–15.5)
WBC: 32.5 10*3/uL — ABNORMAL HIGH (ref 4.0–10.5)
nRBC: 0.6 % — ABNORMAL HIGH (ref 0.0–0.2)

## 2018-03-23 LAB — GLUCOSE, CAPILLARY
Glucose-Capillary: 98 mg/dL (ref 70–99)
Glucose-Capillary: 82 mg/dL (ref 70–99)
Glucose-Capillary: 86 mg/dL (ref 70–99)
Glucose-Capillary: 95 mg/dL (ref 70–99)
Glucose-Capillary: 96 mg/dL (ref 70–99)

## 2018-03-23 LAB — TROPONIN I
Troponin I: 0.42 ng/mL (ref <0.03)
Troponin I: 0.38 ng/mL (ref <0.03)
Troponin I: 0.3 ng/mL (ref <0.03)

## 2018-03-23 MED ORDER — ENSURE MAX PROTEIN PO LIQD
11.0000 [oz_av] | Freq: Every day | ORAL | Status: DC
  Filled 2018-03-23 (×5): qty 330

## 2018-03-23 MED ORDER — FUROSEMIDE 10 MG/ML IJ SOLN
40.0000 mg | Freq: Once | INTRAMUSCULAR | Status: AC
  Administered 2018-03-23: 40 mg via INTRAVENOUS
  Filled 2018-03-23: qty 4

## 2018-03-23 MED ORDER — SENNOSIDES-DOCUSATE SODIUM 8.6-50 MG PO TABS
2.0000 | ORAL_TABLET | Freq: Two times a day (BID) | ORAL | Status: DC
  Administered 2018-03-25 – 2018-03-27 (×2): 2 via ORAL
  Filled 2018-03-23 (×5): qty 2

## 2018-03-23 MED ORDER — POLYETHYLENE GLYCOL 3350 17 G PO PACK
17.0000 g | PACK | Freq: Every day | ORAL | Status: DC
  Administered 2018-03-24 – 2018-03-27 (×4): 17 g via ORAL
  Filled 2018-03-23 (×5): qty 1

## 2018-03-23 MED ORDER — POTASSIUM CHLORIDE CRYS ER 20 MEQ PO TBCR
40.0000 meq | EXTENDED_RELEASE_TABLET | Freq: Once | ORAL | Status: AC
  Administered 2018-03-23: 40 meq via ORAL
  Filled 2018-03-23: qty 2

## 2018-03-23 MED ORDER — ASPIRIN 81 MG PO CHEW
81.0000 mg | CHEWABLE_TABLET | Freq: Every day | ORAL | Status: DC
  Administered 2018-03-23 – 2018-03-27 (×5): 81 mg via ORAL
  Filled 2018-03-23 (×6): qty 1

## 2018-03-23 MED ORDER — MORPHINE SULFATE (PF) 2 MG/ML IV SOLN
1.0000 mg | INTRAVENOUS | Status: DC | PRN
  Administered 2018-03-23 – 2018-03-24 (×5): 1 mg via INTRAVENOUS
  Filled 2018-03-23 (×5): qty 1

## 2018-03-23 MED ORDER — CARVEDILOL 3.125 MG PO TABS
3.1250 mg | ORAL_TABLET | Freq: Two times a day (BID) | ORAL | Status: DC
  Administered 2018-03-23 – 2018-03-25 (×5): 3.125 mg via ORAL
  Filled 2018-03-23 (×6): qty 1

## 2018-03-23 MED ORDER — GUAIFENESIN-DM 100-10 MG/5ML PO SYRP
10.0000 mL | ORAL_SOLUTION | ORAL | Status: DC | PRN
  Administered 2018-03-23 – 2018-03-25 (×5): 10 mL via ORAL
  Filled 2018-03-23 (×5): qty 10

## 2018-03-23 MED ORDER — GUAIFENESIN-DM 100-10 MG/5ML PO SYRP: 5.0000 mL | ORAL_SOLUTION | ORAL | Status: DC | PRN

## 2018-03-23 NOTE — Progress Notes (Signed)
eLink Physician-Brief Progress Note Patient Name: Stephanie Patrick DOB: 1985-11-10 MRN: [id number]   Date of Service  03/23/2018  HPI/Events of Note  12 Lead EKG - NSR - Rate = 65. Low voltage QRS. No acute changes.   eICU Interventions  Continue present management.      Intervention Category Intermediate Interventions: Diagnostic test evaluation  Lyle Leisner Eugene 03/23/2018, 3:45 AM

## 2018-03-23 NOTE — Progress Notes (Addendum)
CRITICAL VALUE ALERT  Critical Value:  Troponin 0.42  Date & Time Notied:  03/23/2018 0309  Provider Notified: notified E-link RN  Orders Received/Actions taken: no new orders at this time

## 2018-03-23 NOTE — Consult Note (Addendum)
Cardiology Consultation:   Patient ID: Stephanie Patrick MRN: [id number]; DOB: 1985-05-21  Admit date: 03/18/2018 Date of Consult: 03/23/2018  Primary Care Provider: Sandford Craze'Sullivan, Melissa, NP Primary Cardiologist: Kristeen MissPhilip Nahser, MD  Primary Electrophysiologist:  None    Patient Profile:   Stephanie Patrick is a 33 y.o. female with a hx of ADHD, migraines, depression, recurrent UTI and vitamin D deficiency who is being seen today for the evaluation of elevated troponin and reduced EF at the request of Dr Wynona Neatlalere.  History of Present Illness:   Stephanie Patrick [facility] on [facility]18/2018 with a left flank pain and found to have a left ureteral stone with obstruction.  She was found to be septic with a temperature of 105 F and tachycardia.  Patient underwent cystoscopy with ureteral stent placement.  Postoperatively she required pressors and intubation due to hypotension and hypoxia.  She was felt to have pulmonary edema versus lung injury with profound metabolic acidosis necessitating bicarb drip.  She has been treated with IV antibiotics, stress dose steroids.  Cultures showed E. Coli.  Echo done on 03/19/2018 was of very poor quality even with Definity.  LVEF was estimated at 30-35% range with moderate diastolic dysfunction.  She had mild mitral regurgitation.  There was a suggestion to do TEE or cardiac MRI to get better view.  Last chest x-ray on 03/20/2018 showed improvement in bilateral edema pattern, persistent bibasilar atelectasis and effusion.  Today the patient reported some chest discomfort with cough.  A 12-lead EKG showed low voltage, question anterior changes.  Troponin was elevated at 0.42 followed by 0.38.  Upon my assessment the patient is sitting up in the chair wearing oxygen via nasal cannula.  She appears to be mildly dyspneic with talking and has an occasional cough productive of tan sputum.  She tells me that she is experiencing chest squeezing that is constant  but worse at times.  She reports her breathing to be difficult and "exhausting".  She notes mild pedal edema.  She does feel occasional palpitations, no lightheadedness or dizziness.  She works as a Emergency planning/management officerproject manager.  She reports having significant anxiety for several years but is been much worse over the past 10 to 12 months.  She notes intermittent chest tightness that she has related to her anxiety.  She says she wore a heart monitor in 2012 that did not show anything.  She does not smoke.  She drinks alcohol socially, 1 glass of wine every 2 to 3 days and slightly more on the weekends.  She has no significant family history of heart disease that she is aware of.  Past Medical History:  Diagnosis Date  . ADHD 02/13/2017  . Chicken pox   . Depression   . Migraines   . Urinary tract infection     Past Surgical History:  Procedure Laterality Date  . CYSTOSCOPY W/ URETERAL STENT PLACEMENT Left 03/18/2018   Procedure: CYSTOSCOPY WITH RETROGRADE PYELOGRAM/URETERAL STENT PLACEMENT;  Surgeon: Jerilee FieldEskridge, Matthew, MD;  Location: [facility] ORS;  Service: Urology;  Laterality: Left;     Home Medications:  Prior to Admission medications   Medication Sig Start Date End Date Taking? Authorizing Provider  amphetamine-dextroamphetamine (ADDERALL) 5 MG tablet Take 5 mg by mouth daily.   Yes [provider]  diphenhydramine-acetaminophen (TYLENOL PM) 25-500 MG TABS tablet Take 1 tablet by mouth at bedtime as needed (sleep).   Yes [provider]  fluticasone (FLONASE) 50 MCG/ACT nasal spray Place 1 spray into both  nostrils 2 (two) times daily. 03/09/18 05/08/18 Yes Betancourt, Jarold Songina A, NP  predniSONE (STERAPRED UNI-PAK 21 TAB) 10 MG (21) TBPK tablet Taper take 6 pills by mouth day 1; 5 pills day 2; 4 pills day 3; 3 pills day 4; 2 pills day 5 and 1 pill day 6 with breakfast daily Patient not taking: Reported on 03/18/2018 03/11/18   Barbaraann BarthelBetancourt, Tina A, NP    Inpatient Medications: Scheduled Meds: .  amphetamine-dextroamphetamine  5 mg Oral Daily  . aspirin  81 mg Oral Daily  . carvedilol  3.125 mg Oral BID WC  . Chlorhexidine Gluconate Cloth  6 each Topical Daily  . enoxaparin (LOVENOX) injection  40 mg Subcutaneous Daily  . famotidine  20 mg Oral BID  . fluticasone  1 spray Each Nare BID  . LORazepam  1 mg Oral QHS  . mouth rinse  15 mL Mouth Rinse BID  . mupirocin ointment  1 application Nasal BID  . polyethylene glycol  17 g Oral Daily  . ENSURE MAX PROTEIN  11 oz Oral Daily  . senna-docusate  2 tablet Oral BID  . sodium chloride flush  10-40 mL Intracatheter Q12H   Continuous Infusions: . sodium chloride Stopped (03/20/18 1940)  . cefTRIAXone (ROCEPHIN)  IV Stopped (03/22/18 1233)   PRN Meds: sodium chloride, acetaminophen, diphenhydrAMINE, docusate, guaiFENesin-dextromethorphan, morphine injection, ondansetron **OR** ondansetron (ZOFRAN) IV, sodium chloride flush  Allergies:    Allergies  Allergen Reactions  . Vicodin [Hydrocodone-Acetaminophen] Nausea And Vomiting    Social History:   Social History   Socioeconomic History  . Marital status: Single    Spouse name: Not on file  . Number of children: Not on file  . Years of education: Not on file  . Highest education level: Not on file  Occupational History  . Not on file  Social Needs  . Financial resource strain: Not on file  . Food insecurity:    Worry: Not on file    Inability: Not on file  . Transportation needs:    Medical: Not on file    Non-medical: Not on file  Tobacco Use  . Smoking status: Never Smoker  . Smokeless tobacco: Never Used  Substance and Sexual Activity  . Alcohol use: Yes  . Drug use: No  . Sexual activity: Yes    Partners: Male  Lifestyle  . Physical activity:    Days per week: Not on file    Minutes per session: Not on file  . Stress: Not on file  Relationships  . Social connections:    Talks on phone: Not on file    Gets together: Not on file    Attends religious  service: Not on file    Active member of club or organization: Not on file    Attends meetings of clubs or organizations: Not on file    Relationship status: Not on file  . Intimate partner violence:    Fear of current or ex partner: Not on file    Emotionally abused: Not on file    Physically abused: Not on file    Forced sexual activity: Not on file  Other Topics Concern  . Not on file  Social History Narrative   Dad committed suicide   [place] in [place]   Book clubs online   Best friend lives here    Family History:    Family History  Problem Relation Age of Onset  . Healthy Mother   . Diabetes Maternal Grandmother   .  Hypertension Maternal Grandmother   . Breast cancer Maternal Grandmother      ROS:  Please see the history of present illness.   All other ROS reviewed and negative.     Physical Exam/Data:   Vitals:   03/23/18 0400 03/23/18 0500 03/23/18 0800 03/23/18 1110  BP:    (!) 115/58  Pulse: 68 75  64  Resp: (!) 23 (!) 26  (!) 28  Temp:   99.6 F (37.6 C)   TempSrc:   Axillary   SpO2: 96% 95%  99%  Weight:      Height:        Intake/Output Summary (Last 24 hours) at 03/23/2018 1134 Last data filed at 03/23/2018 0500 Gross per 24 hour  Intake 578.51 ml  Output 1700 ml  Net -1121.49 ml   Last 3 Weights 03/23/2018 03/22/2018 03/21/2018  Weight (lbs) 216 lb 0.8 oz 220 lb 3.8 oz 216 lb 4.3 oz  Weight (kg) 98 kg 99.9 kg 98.1 kg     Body mass index is 39.52 kg/m.  General:  Well nourished, well developed, in no acute distress HEENT: normal Lymph: no adenopathy Neck: no JVD Endocrine:  No thryomegaly Vascular: No carotid bruits; pedal pulses 2+ bilaterally  Cardiac:  normal S1, S2; RRR; no murmur  Lungs:  clear to auscultation bilaterally, no wheezing, rhonchi or rales  Abd: soft, nontender, no hepatomegaly  Ext: Trace lower leg edema Musculoskeletal:  No deformities, BUE and BLE strength normal and equal Skin: warm and dry  Neuro:  CNs 2-12  intact, no focal abnormalities noted Psych:  Normal affect   EKG:  The EKG was personally reviewed and demonstrates: Normal sinus rhythm 68 bpm with Low voltage QRS. Cannot rule out Anterior infarct Telemetry:  Telemetry was personally reviewed and demonstrates: Sinus rhythm 60s-70s  Relevant CV Studies:  Echocardiogram 03/19/2018 Study Conclusions - Left ventricle: The cavity size was normal. Wall thickness was   normal. Systolic function was moderately to severely reduced. The   estimated ejection fraction was in the range of 30% to 35%.   Images were inadequate for LV wall motion assessment. Features   are consistent with a pseudonormal left ventricular filling   pattern, with concomitant abnormal relaxation and increased   filling pressure (grade 2 diastolic dysfunction). - Aortic valve: There was no stenosis. - Mitral valve: There was mild regurgitation. - Right ventricle: The cavity size was normal. Systolic function   was mildly reduced. - Tricuspid valve: Peak RV-RA gradient (S): 21 mm Hg. - Pulmonary arteries: PA peak pressure: 29 mm Hg (S). - Systemic veins: IVC measured 1.9 cm with < 50% respirophasic   variation, suggesting RA pressure 8 mmHg.  Impressions: - Very poor quality echo even with Definity. LV EF is estimated in   30-35% range but would suggest TEE or cardiac MRI to get better   view. Moderate diastolic dysfunction. Normal RV size wtih mildly   decreased systolic function. Mild mitral regurgitation.  Laboratory Data:  Chemistry Recent Labs  Lab 03/20/18 0500 03/21/18 0300 03/22/18 1153  NA 145 142 140  K 4.5 4.1 3.4*  CL 108 108 105  CO2 24 26 27   GLUCOSE 153* 111* 153*  BUN 19 21* 20  CREATININE 1.07* 0.80 0.73  CALCIUM 7.2* 7.6* 8.0*  GFRNONAA >60 >60 >60  GFRAA >60 >60 >60  ANIONGAP 13 8 8     Recent Labs  Lab 03/18/18 1343 03/19/18 0348 03/22/18 1153  PROT 8.1 4.5* 5.4*  ALBUMIN 4.9  2.7* 2.5*  AST 74* 43* 92*  ALT 78* 39 321*    ALKPHOS 100 52 149*  BILITOT 1.4* 1.9* 1.5*   Hematology Recent Labs  Lab 03/21/18 0300 03/22/18 1153 03/23/18 0424  WBC 46.7* 55.7* 32.5*  RBC 3.35* 3.90 3.70*  HGB 10.1* 11.7* 10.8*  HCT 31.7* 36.6 33.8*  MCV 94.6 93.8 91.4  MCH 30.1 30.0 29.2  MCHC 31.9 32.0 32.0  RDW 14.1 14.0 13.7  PLT 77* 125* 148*   Cardiac Enzymes Recent Labs  Lab 03/23/18 0204 03/23/18 0618  TROPONINI 0.42* 0.38*   No results for input(s): TROPIPOC in the last 168 hours.  BNPNo results for input(s): BNP, PROBNP in the last 168 hours.  DDimer No results for input(s): DDIMER in the last 168 hours.  Radiology/Studies:  Dg Chest Port 1 View  Result Date: 03/20/2018 CLINICAL DATA:  Respiratory failure EXAM: PORTABLE CHEST 1 VIEW COMPARISON:  03/19/2018 FINDINGS: Endotracheal tube in good position. Right jugular central venous catheter tip cavoatrial junction unchanged. NG tube in place. Mild improvement in bilateral airspace disease. Small bilateral effusions unchanged. IMPRESSION: Improvement in bilateral edema pattern. Persistent bibasilar atelectasis and effusion. Support lines in good position. Electronically Signed   By: Marlan Palau M.D.   On: 03/20/2018 06:42    Assessment and Plan:   1.  Chest pain -Prior to admission patient experienced intermittent chest tightness that she relates to anxiety, occurring every day.  Today she feels chest squeezing that is constant, worse at times and her breathing is difficult, "exhausting". -Troponins 0.42 and 0.38.  So far no significant rise to indicate acute coronary syndrome -Likely related to demand ischemia with her acute illness and need for pressors and intubation.  Now with productive cough.  2.  LV dysfunction -In setting of severe urosepsis related to left ureteral stone with obstruction/pyelonephritis.  Patient required pressors and intubation. -Echocardiogram done on 03/19/2018 was a very poor study but estimated the LVEF at 30-35% with grade 2  diastolic dysfunction -Patient has been started on carvedilol 3.125 mg twice daily and is being diuresed with Lasix 40 mg IV.  -Patient notes a history of significant anxiety, worse over the last 10-12 months with related intermittent chest tightness. -We will have Dr. Elease Hashimoto review echocardiogram and provide recommendations.  Will likely follow-up in our office and repeat echo after fully recovered from this acute illness.  3.  Urosepsis -Treated with IV antibiotics and stress steroids -WBCs up to 55K, improving, 32.5k today.  For questions or updates, please contact [facility] Please consult [email or web address] for contact info under     Signed, Berton Bon, NP  03/23/2018 11:34 AM   Attending Note:   The patient was seen and examined.  Agree with assessment and plan as noted above.  Changes made to the above note as needed.  Patient seen and independently examined with  Lizabeth Leyden, NP .   We discussed all aspects of the encounter. I agree with the assessment and plan as stated above.  1.  Acute systolic dysfunction: The patient presented with an episode of severe urosepsis with a temperature of 105.  She had a ureteral stone obstruction.  I suspect that her decreased LV function was due to sepsis syndrome.  She is currently feeling better and is on antibiotics.  He has been started on carvedilol 3.125 mg a day.  She is being diuresed. I suspect that her left ventricular dysfunction will completely improve with treatment of her sepsis syndrome.  We will  see her in the office and reassess her echocardiogram in several weeks/months.  2.  Chest discomfort: Her chest pain is very atypical.  It is associated with a cough. SPECT it is residual from her infection and sepsis syndrome.  .  Troponin elevations: Her troponin levels are minimally elevated with a flat trend.  This is not consistent with an acute coronary syndrome.  We will reassess this when she is better.    I have spent  a total of 40 minutes with patient reviewing hospital  notes , telemetry, EKGs, labs and examining patient as well as establishing an assessment and plan that was discussed with the patient. > 50% of time was spent in direct patient care.    Vesta Mixer, Montez Hageman., MD, [facility] 03/23/2018, 2:28 PM [address],  [address] Office [phone] Pager [phone]

## 2018-03-23 NOTE — Progress Notes (Signed)
Patient was seen today with nurse practitioner Zenia Resides  Was seen and examined Records reviewed Radiological studies reviewed  Assessment and plan Ureteral stone with obstructive pyelonephritis, E. coli sepsis with bacteremia Day 6 of 10 of ceftriaxone -Continue ceftriaxone  Cardiomyopathy -Ejection fraction of 30 to 35% -Cardiology consulted to evaluate -Started on low-dose Coreg -Cautious diuresis  Pulmonary edema/hypoxemia Continue diuresis Supplemental oxygen  Thrombocytopenia -Likely related to sepsis -Trend counts  We will continue antibiotics Transferred to [facility] service Cardiology to follow-up

## 2018-03-23 NOTE — Progress Notes (Signed)
Nutrition Follow-up  DOCUMENTATION CODES:   Obesity unspecified  INTERVENTION:  - Will order Ensure Max once/day, each supplement provides 150 kcal and 30 grams of protein. - Continue to encourage PO intakes.   NUTRITION DIAGNOSIS:   Increased nutrient needs related to acute illness as evidenced by estimated needs. -revised  GOAL:   Patient will meet greater than or equal to 90% of their needs -beginning to meet  MONITOR:   PO intake, Supplement acceptance, Weight trends, Labs  ASSESSMENT:   33 year old female with medical history significant of recurrent UTIs and admitted with left ureteral stone with obstruction. Transferred to ICU s/p urological intervention when pt was hypoxic and hypotensive  Patient was extubated and OGT removed on 1/18. Diet advanced to CLD on 1/19 at 8:55 AM, to FLD on 1/20 at 10:05 AM, and then to Regular on 1/20 at 2:40 PM. Patient consumed 50% of breakfast, 75% of lunch, and 100% of dinner on 1/20. Estimated nutrition needs have been updated at this time. Noted no BM since PTA (>5 days).  Per Pete's note earlier this AM: patient with complaints of chest pain, Cardiology consulted for new cardiomyopathy and troponin elevation, L ureteral stone with obstruction/pyelonephritis and urosepsis, plan for repeat L ureteroscopy the first week of February, ongoing hypoxia, pulmonary edema, thrombocytopenia, insomnia, constipation with plan to add miralax.   Medications reviewed; 20 mg oral pepcid BID, 40 mg IV lasix x1 dose 1/21, 1 packet miralax/day, 2 tablets senokot BID, 40 mEq K-Dur x1 dose 1/21. Labs reviewed; CBGs: 98 and 82 mg/dl today, K: 3.4 mmol/l, Ca: 8 mg/dl, Alk Phos elevated, LFTs elevated.       Diet Order:   Diet Order            Diet regular Room service appropriate? Yes; Fluid consistency: Thin  Diet effective now              EDUCATION NEEDS:   No education needs have been identified at this time  Skin:  Skin Assessment: Reviewed  RN Assessment  Last BM:  PTA/unknown  Height:   Ht Readings from Last 1 Encounters:  03/18/18 _0  (1.575 m)    Weight:   Wt Readings from Last 1 Encounters:  03/23/18 98 kg    Ideal Body Weight:  50 kg  BMI:  Body mass index is 39.52 kg/m.  Estimated Nutritional Needs:   Kcal:  5465-6812 kcal  Protein:  100-115 grams  Fluid:  </= 2L/day     Jarome Matin, MS, RD, LDN, [facility] Inpatient Clinical Dietitian Pager # [phone] After hours/weekend pager # [phone]

## 2018-03-23 NOTE — Progress Notes (Signed)
eLink Physician-Brief Progress Note Patient Name: Stephanie Patrick DOB: [birth year] MRN: [id number]   Date of Service  03/23/2018  HPI/Events of Note  Patient c/o chest pain with cough.   eICU Interventions  Will order: 1. 12 Lead EKG now.  2. Cycle Troponin. 3. Robitussion DM 10 mL PO Q 4 hours PRN cough.     Intervention Category Major Interventions: Other:  Sommer,Steven Dennard Nip 03/23/2018, 1:52 AM

## 2018-03-23 NOTE — Progress Notes (Addendum)
..   NAME:  Stephanie Patrick, MRN:  [id number], DOB:  06-02-1985, LOS: 5 ADMISSION DATE:  03/18/2018, CONSULTATION DATE:  03/19/2018 REFERRING MD:  , CHIEF COMPLAINT:  Acute hypoxic resp failure   Brief History   33 yo female with PMHx sig for vit D def, recurrent UTI presented to [facility] on 1/16 with left flank pain found to have a Left ureteral stone with obstruction > emergent stent drained pus from L kidney but pt deteriorated with gm neg septic shock and ccm service requested.  Past Medical History  .Marland Kitchen Active Ambulatory Problems    Diagnosis Date Noted  . Recurrent UTI 02/13/2017  . Depression 02/13/2017  . ADHD 02/13/2017  . Migraines 02/16/2017  . Vitamin D deficiency 02/16/2017  . Preventative health care 02/16/2017  . Insomnia 02/16/2017    Hospital events   1/16 admitted with left flank pain found to have ureteral stone with obstruction underwent emergent stent, deteriorated with gram-negative septic shock and respiratory failure 1/17: Intubated for respiratory failure felt possibly pulmonary edema versus acute lung injury, profound metabolic acidosis necessitating bicarbonate drip 1/18 bicarbonate stopped, vasopressin discontinued, successfully extubated. 1/19: Stress dose steroids stopped levophed stopped, creatinine improving 1/20: All cultures showing E. coli, pansensitive.  Gentamicin discontinued. 1/21: Patient reporting chest discomfort with cough.  Twelve-lead shows low voltage question anterior changes.  Troponin elevated.  Peak 0.42.  Aspirin started, On morning rounds still complaining of chest discomfort particularly with deep breath, admitting echocardiogram reviewed demonstrates ejection fraction of 30 to 35% with moderate to severely reduced systolic function also noted grade 2 diastolic dysfunction.  Cardiology asked to evaluate for new cardiomyopathy and troponin elevation.  Lasix given per pulmonary edema, Coreg started.  Consults:  Urology 03/18/2018 PCCM  03/19/2018  Procedures:  Cystoscopy with Retrograde Pyelogram/ Left Ureteral Stent Placement 03/19/2018 Endotracheally intubated 03/19/2018 Arterial line right brachial 03/19/2018 CVL R IJ  03/19/18      Significant Diagnostic Tests:    Micro Data:  MRSA PCR positive   03/18/2018 Blood cx  1/16 Pos x 2 = gram-negative rods, E. Coli pan sensitive . Enterobacter>>> UC 1/16 same e coli   Antimicrobials:  Vancomycin- 03/18/2018 x 1 dose  Rocephin 03/18/2018, 117 increased to 2 g IV every 24 hours>> Gentamicin 1/17 >>> stopped 1/19  Subjective  Still has dysuria when voiding particularly reports left flank pain still.  Also complaining of some shortness of breath and chest discomfort particularly with deep breath  Objective   Blood pressure 102/63, pulse 75, temperature 99.6 F (37.6 C), temperature source Axillary, resp. rate (Abnormal) 26, height 5\' 2"  (1.575 m), weight 98 kg, last menstrual period 03/01/2018, SpO2 95 %.   Intake/Output Summary (Last 24 hours) at 03/23/2018 0944 Last data filed at 03/23/2018 0500 Gross per 24 hour  Intake 578.51 ml  Output 1950 ml  Net -1371.49 ml   Filed Weights   03/21/18 0430 03/22/18 0500 03/23/18 0351  Weight: 98.1 kg 99.9 kg 98 kg  Nasal cannula 3 L  Examination: General: 33 year old female patient resting in bed, however she does report discomfort with deep breath HEENT normocephalic atraumatic no jugular venous distention sclera had been reported is icteric previously, not appreciated on a.m. exam Pulmonary: Diminished bases no accessory use Cardiac: Regular rate and rhythm without murmur rub or gallop Abdomen: Soft nontender no organomegaly GU: Clear yellow still reports dysuria Neuro: Awake oriented no focal deficits Extremities: Warm dry brisk capillary refill dependent edema is appreciated.     Anion gap metabolic  acidosis 2/2 lactic acidosis Septic shock  Acute respiratory failure, extubated 1/18. Acute kidney injury serum  creatinine normalized on 1/19  Assessment & Plan:   Left ureteral stone with obstruction/pyelonephritis and e-coli urosepsis w/ associated e coli bacteremia  Cystoscopy with Retrograde Pyelogramm/ Left Uretral Stent Placement 1/16 Plan Day # 6 of 10 abx (ceftriaxone) Follow-up blood chemistry Will need repeat left ureteroscopy first week of February  New cardiomyopathy, both systolic and diastolic.  EF 30 to 35%, estimated grade 2 diastolic dysfunction.  All in the setting of abnormal troponin level which is trending down Most likely reflects septic cardiomyopathy Plan IV Lasix Continue telemetry PRN morphine for preload reduction, and chest discomfort. Low-dose Coreg We will ask cardiology to evaluate  Chest discomfort, ongoing hypoxia, with pulmonary edema Plan Repeat chest x-ray  IV Lasix Wean oxygen Mobilize  Fluid and electrolyte imbalance Hypokalemia Plan Repeat chemistry this a.m. replace as indicated  Thrombocytopenia likely related to sepsis -cont to improve Plan Trend cbc  Sleep onset insomnia/sleep maintenance insomnia Plan Add melatonin  PRN ativan   Constipation Plan Add miralax   Best practice:  Diet: Regular diet Pain/Anxiety/Delirium protocol: Not indicated VAP protocol (if indicated):  N/a  DVT prophylaxis: lovenox  GI prophylaxis: PPi Glucose control: BG goal <195m Mobility: dangle then up in chair if tol  Code Status: FULL Family Communication: full fm at bedside am 1/20 updated Disposition: Stepdown status as of 1/21.  Asking [facility] to assume care effective 1/22.  Treating for new septic cardiomyopathy, she is on appropriate antibiotics at this point.  White blood cell count trending down, and platelets normalizing or good sign.  She does report chest discomfort I suspect this is related to pulmonary edema we are diuresing her today.  Given her newly identified cardiomyopathy we will ask cardiology to see her  Simonne Martinet  ACNP-BC [facility] Pulmonary/Critical Care Pager # [phone] OR # [phone] if no answer

## 2018-03-23 NOTE — Progress Notes (Signed)
eLink Physician-Brief Progress Note Patient Name: Stephanie Patrick DOB: 01/22/1986 MRN: [id number]   Date of Service  03/23/2018  HPI/Events of Note  Troponin #1 = 0.42. Await ordered EKG. Not in Epic at this time.   eICU Interventions  Will order: 1. ASA 81 mg PO now and Q day. 2. Continue to cycle Troponin.         Edilson Vital Eugene 03/23/2018, 3:22 AM

## 2018-03-24 DIAGNOSIS — I5021 Acute systolic (congestive) heart failure: Secondary | ICD-10-CM

## 2018-03-24 DIAGNOSIS — E876 Hypokalemia: Secondary | ICD-10-CM

## 2018-03-24 LAB — COMPREHENSIVE METABOLIC PANEL
ALBUMIN: 2.5 g/dL — AB (ref 3.5–5.0)
ALT: 166 U/L — ABNORMAL HIGH (ref 0–44)
AST: 56 U/L — ABNORMAL HIGH (ref 15–41)
Alkaline Phosphatase: 195 U/L — ABNORMAL HIGH (ref 38–126)
Anion gap: 8 (ref 5–15)
BILIRUBIN TOTAL: 1.2 mg/dL (ref 0.3–1.2)
BUN: 9 mg/dL (ref 6–20)
CO2: 31 mmol/L (ref 22–32)
Calcium: 8 mg/dL — ABNORMAL LOW (ref 8.9–10.3)
Chloride: 100 mmol/L (ref 98–111)
Creatinine, Ser: 0.71 mg/dL (ref 0.44–1.00)
GFR calc Af Amer: >60 mL/min (ref >60)
GFR calc non Af Amer: >60 mL/min (ref >60)
Glucose, Bld: 100 mg/dL — ABNORMAL HIGH (ref 70–99)
Potassium: 3 mmol/L — ABNORMAL LOW (ref 3.5–5.1)
Sodium: 139 mmol/L (ref 135–145)
Total Protein: 5.2 g/dL — ABNORMAL LOW (ref 6.5–8.1)

## 2018-03-24 LAB — GLUCOSE, CAPILLARY
Glucose-Capillary: 88 mg/dL (ref 70–99)
Glucose-Capillary: 110 mg/dL — ABNORMAL HIGH (ref 70–99)
Glucose-Capillary: 91 mg/dL (ref 70–99)

## 2018-03-24 MED ORDER — POTASSIUM CHLORIDE CRYS ER 20 MEQ PO TBCR
40.0000 meq | EXTENDED_RELEASE_TABLET | Freq: Once | ORAL | Status: AC
  Administered 2018-03-24: 40 meq via ORAL
  Filled 2018-03-24: qty 2

## 2018-03-24 MED ORDER — BENZONATATE 100 MG PO CAPS
100.0000 mg | ORAL_CAPSULE | Freq: Three times a day (TID) | ORAL | Status: DC
  Filled 2018-03-24: qty 1

## 2018-03-24 MED ORDER — FUROSEMIDE 10 MG/ML IJ SOLN
20.0000 mg | Freq: Once | INTRAMUSCULAR | Status: AC
  Administered 2018-03-24: 20 mg via INTRAVENOUS
  Filled 2018-03-24: qty 2

## 2018-03-24 MED ORDER — BENZONATATE 100 MG PO CAPS
200.0000 mg | ORAL_CAPSULE | Freq: Three times a day (TID) | ORAL | Status: DC
  Administered 2018-03-24 (×3): 200 mg via ORAL
  Filled 2018-03-24 (×7): qty 2

## 2018-03-24 MED ORDER — ACYCLOVIR 5 % EX OINT
TOPICAL_OINTMENT | CUTANEOUS | Status: DC
  Administered 2018-03-24 – 2018-03-25 (×7): via TOPICAL
  Administered 2018-03-26: 1 via TOPICAL
  Administered 2018-03-26 – 2018-03-27 (×4): via TOPICAL
  Filled 2018-03-24: qty 15

## 2018-03-24 MED ORDER — OXYCODONE-ACETAMINOPHEN 5-325 MG PO TABS
1.0000 | ORAL_TABLET | ORAL | Status: DC | PRN
  Administered 2018-03-24: 1 via ORAL
  Filled 2018-03-24: qty 1

## 2018-03-24 NOTE — Progress Notes (Signed)
[facility] PROGRESS NOTE  Stephanie Patrick BJY:[id number] DOB: Apr 04, 1985 DOA: 03/18/2018  PCP: Sandford Craze'Sullivan, Melissa, NP  Brief History/Interval Summary: 33 yo female with PMHx sig for vit D def, recurrent UTI [facility] with left flank pain found to have a Left ureteral stone with obstruction > emergent stent drained pus from L kidney but pt deteriorated with gm neg septic shock.  Patient was initially admitted to the intensive care unit.  She had to be intubated for airway protection.  She was successfully extubated.  Has been stable for the past many days.  Transferred to the hospitalist service.  Reason for Visit: Acute pyelonephritis.  E. coli sepsis.  Consultants: Urology.  Critical care medicine.   Hospital events   1/16 admitted with left flank pain found to have ureteral stone with obstruction underwent emergent stent, deteriorated with gram-negative septic shock and respiratory failure 1/17: Intubated for respiratory failure felt possibly pulmonary edema versus acute lung injury, profound metabolic acidosis necessitating bicarbonate drip 1/18 bicarbonate stopped, vasopressin discontinued, successfully extubated. 1/19: Stress dose steroids stopped levophed stopped, creatinine improving 1/20: All cultures showing E. coli, pansensitive.  Gentamicin discontinued. 1/21: Patient reporting chest discomfort with cough.  Twelve-lead shows low voltage question anterior changes.  Troponin elevated.  Peak 0.42.  Aspirin started, On morning rounds still complaining of chest discomfort particularly with deep breath, admitting echocardiogram reviewed demonstrates ejection fraction of 30 to 35% with moderate to severely reduced systolic function also noted grade 2 diastolic dysfunction.  Cardiology asked to evaluate for new cardiomyopathy and troponin elevation.  Lasix given per pulmonary edema, Coreg started. 03/24/18 uneventful night, Feeling better, Still has some pain and  discomfort  Consults:  Urology 03/18/2018 PCCM 03/19/2018  Procedures:  Cystoscopy with Retrograde Pyelogram/ Left Ureteral Stent Placement 03/19/2018 Endotracheally intubated 03/19/2018 Arterial line right brachial 03/19/2018 CVL R IJ  03/19/18      Antimicrobials:  Vancomycin- 03/18/2018 x 1 dose  Rocephin 03/18/2018, 117 increased to 2 g IV every 24 hours>> Gentamicin 1/17 >>> stopped 1/19   Antibiotics: Ceftriaxone  Subjective/Interval History: Patient states that overall she has improved but continues to have some difficulty breathing.  Complains of chest pain primarily due to coughing episodes.  Denies any nausea vomiting.  No abdominal pain.  ROS: Denies any headaches  Objective:  Vital Signs  Vitals:   03/24/18 0500 03/24/18 0600 03/24/18 0800 03/24/18 0822  BP:  (!) 107/55  (!) 105/59  Pulse: 65 70  84  Resp:    20  Temp:   98.7 F (37.1 C)   TempSrc:   Oral   SpO2: 93% 96%  92%  Weight: 93.1 kg     Height:        Intake/Output Summary (Last 24 hours) at 03/24/2018 1129 Last data filed at 03/24/2018 0700 Gross per 24 hour  Intake 99.87 ml  Output 6200 ml  Net -6100.13 ml   Filed Weights   03/22/18 0500 03/23/18 0351 03/24/18 0500  Weight: 99.9 kg 98 kg 93.1 kg    General appearance: alert, cooperative, appears stated age and no distress Head: Normocephalic, without obvious abnormality, atraumatic Resp: Lee tachypneic at rest.  Diminished air entry at the bases with a few crackles.  No wheezing or rhonchi. Cardio: regular rate and rhythm, S1, S2 normal, no murmur, click, rub or gallop GI: soft, non-tender; bowel sounds normal; no masses,  no organomegaly Extremities: extremities normal, atraumatic, no cyanosis or edema Pulses: 2+ and symmetric Neurologic: Alert and oriented  x3.  Cranial nerves II through XII intact.  Motor strength equal bilateral upper and lower extremities.  Lab Results:  Data Reviewed: I have personally reviewed following labs  and imaging studies  CBC: Recent Labs  Lab 03/19/18 0625 03/20/18 0500 03/21/18 0300 03/22/18 1153 03/23/18 0424  WBC 27.2* 39.1* 46.7* 55.7* 32.5*  NEUTROABS  --  29.8*  --  46.5* 22.7*  HGB 11.7* 10.8* 10.1* 11.7* 10.8*  HCT 36.1 33.7* 31.7* 36.6 33.8*  MCV 92.3 92.3 94.6 93.8 91.4  PLT 95* 64* 77* 125* 148*    Basic Metabolic Panel: Recent Labs  Lab 03/19/18 0348 03/19/18 1620 03/20/18 0500 03/20/18 1709 03/21/18 0300 03/22/18 1153 03/24/18 0500  NA 139  --  145  --  142 140 139  K 3.8  --  4.5  --  4.1 3.4* 3.0*  CL 113*  --  108  --  108 105 100  CO2 15*  --  24  --  26 27 31   GLUCOSE 143*  --  153*  --  111* 153* 100*  BUN 12  --  19  --  21* 20 9  CREATININE 1.07*  --  1.07*  --  0.80 0.73 0.71  CALCIUM 5.5*  --  7.2*  --  7.6* 8.0* 8.0*  MG  --  1.4* 2.0 2.1 2.3  --   --   PHOS  --  3.5 2.5 1.9* 2.1*  --   --     GFR: Estimated Creatinine Clearance: 107.3 mL/min (by C-G formula based on SCr of 0.71 mg/dL).  Liver Function Tests: Recent Labs  Lab 03/18/18 1343 03/19/18 0348 03/22/18 1153 03/24/18 0500  AST 74* 43* 92* 56*  ALT 78* 39 321* 166*  ALKPHOS 100 52 149* 195*  BILITOT 1.4* 1.9* 1.5* 1.2  PROT 8.1 4.5* 5.4* 5.2*  ALBUMIN 4.9 2.7* 2.5* 2.5*    Recent Labs  Lab 03/18/18 1343  LIPASE 29    Coagulation Profile: Recent Labs  Lab 03/19/18 0348  INR 1.59    Cardiac Enzymes: Recent Labs  Lab 03/23/18 0204 03/23/18 0618 03/23/18 1409  TROPONINI 0.42* 0.38* 0.30*    CBG: Recent Labs  Lab 03/23/18 1634 03/23/18 1925 03/23/18 2327 03/24/18 0339 03/24/18 0804  GLUCAP 88 95 96 110* 91     Recent Results (from the past 240 hour(s))  Urine culture     Status: Abnormal   Collection Time: 03/18/18  1:45 PM  Result Value Ref Range Status   Specimen Description   Final    URINE, RANDOM Performed at [facility], [address], [place], [place] [address]    Special Requests   Final    NONE Performed  at [facility], [address] [place], [place] [address]    Culture 80,000 COLONIES/mL ESCHERICHIA COLI (A)  Final   Report Status 03/21/2018 FINAL  Final   Organism ID, Bacteria ESCHERICHIA COLI (A)  Final      Susceptibility   Escherichia coli - MIC*    AMPICILLIN 8 SENSITIVE Sensitive     CEFAZOLIN <=4 SENSITIVE Sensitive     CEFTRIAXONE <=1 SENSITIVE Sensitive     CIPROFLOXACIN <=0.25 SENSITIVE Sensitive     GENTAMICIN <=1 SENSITIVE Sensitive     IMIPENEM <=0.25 SENSITIVE Sensitive     NITROFURANTOIN <=16 SENSITIVE Sensitive     TRIMETH/SULFA <=20 SENSITIVE Sensitive     AMPICILLIN/SULBACTAM 4 SENSITIVE Sensitive     PIP/TAZO <=4 SENSITIVE Sensitive  Extended ESBL NEGATIVE Sensitive     * 80,000 COLONIES/mL ESCHERICHIA COLI  Wet prep, genital     Status: Abnormal   Collection Time: 03/18/18  3:36 PM  Result Value Ref Range Status   Yeast Wet Prep HPF POC NONE SEEN NONE SEEN Final   Trich, Wet Prep NONE SEEN NONE SEEN Final   Clue Cells Wet Prep HPF POC NONE SEEN NONE SEEN Final   WBC, Wet Prep HPF POC RARE (A) NONE SEEN Final    Comment: Specimen diluted due to transport tube containing more than 1 ml of saline, interpret results with caution.   Sperm NONE SEEN  Final    Comment: Performed at [facility], [address] [place], [place] [address]  Blood Culture (routine x 2)     Status: Abnormal   Collection Time: 03/18/18  7:13 PM  Result Value Ref Range Status   Specimen Description   Final    BLOOD RIGHT FOREARM Performed at [facility], [address] [place], [place] [address]    Special Requests   Final    BOTTLES DRAWN AEROBIC AND ANAEROBIC Blood Culture adequate volume Performed at [facility], [address] [place], [place] [address]    Culture  Setup Time   Final    GRAM NEGATIVE RODS IN BOTH AEROBIC AND ANAEROBIC BOTTLES CRITICAL RESULT CALLED TO, READ BACK BY AND VERIFIED WITH: Azzie Glatter PharmD 9:50 03/19/18 ([email or web address]) Performed at [facility], [address], [place], [place] [address]    Culture ESCHERICHIA COLI (A)  Final   Report Status 03/21/2018 FINAL  Final   Organism ID, Bacteria ESCHERICHIA COLI  Final      Susceptibility   Escherichia coli - MIC*    AMPICILLIN 4 SENSITIVE Sensitive     CEFAZOLIN <=4 SENSITIVE Sensitive     CEFEPIME <=1 SENSITIVE Sensitive     CEFTAZIDIME <=1 SENSITIVE Sensitive     CEFTRIAXONE <=1 SENSITIVE Sensitive     CIPROFLOXACIN <=0.25 SENSITIVE Sensitive     GENTAMICIN <=1 SENSITIVE Sensitive     IMIPENEM <=0.25 SENSITIVE Sensitive     TRIMETH/SULFA <=20 SENSITIVE Sensitive     AMPICILLIN/SULBACTAM <=2 SENSITIVE Sensitive     PIP/TAZO <=4 SENSITIVE Sensitive     Extended ESBL NEGATIVE Sensitive     * ESCHERICHIA COLI  Blood Culture (routine x 2)     Status: Abnormal   Collection Time: 03/18/18  7:13 PM  Result Value Ref Range Status   Specimen Description BLOOD LEFT HAND  Final   Special Requests   Final    BOTTLES DRAWN AEROBIC AND ANAEROBIC Blood Culture results may not be optimal due to an inadequate volume of blood received in culture bottles   Culture  Setup Time   Final    GRAM NEGATIVE RODS IN BOTH AEROBIC AND ANAEROBIC BOTTLES CRITICAL VALUE NOTED.  VALUE IS CONSISTENT WITH PREVIOUSLY REPORTED AND CALLED VALUE.    Culture (A)  Final    ESCHERICHIA COLI SUSCEPTIBILITIES PERFORMED ON PREVIOUS CULTURE WITHIN THE LAST 5 DAYS. Performed at [facility], [address], [place], [place] [address]    Report Status 03/21/2018 FINAL  Final  Blood Culture ID Panel (Reflexed)     Status: Abnormal   Collection Time: 03/18/18  7:13 PM  Result Value Ref Range Status   Enterococcus species NOT DETECTED NOT DETECTED Final   Listeria monocytogenes NOT DETECTED NOT DETECTED Final   Staphylococcus species  NOT DETECTED NOT DETECTED Final   Staphylococcus aureus (BCID) NOT DETECTED NOT DETECTED Final   Streptococcus  species NOT DETECTED NOT DETECTED Final   Streptococcus agalactiae NOT DETECTED NOT DETECTED Final   Streptococcus pneumoniae NOT DETECTED NOT DETECTED Final   Streptococcus pyogenes NOT DETECTED NOT DETECTED Final   Acinetobacter baumannii NOT DETECTED NOT DETECTED Final   Enterobacteriaceae species DETECTED (A) NOT DETECTED Final    Comment: Enterobacteriaceae represent a large family of gram-negative bacteria, not a single organism. CRITICAL RESULT CALLED TO, READ BACK BY AND VERIFIED WITH: Azzie Glatter PharmD 9:50 03/19/18 ([email or web address])    Enterobacter cloacae complex NOT DETECTED NOT DETECTED Final   Escherichia coli DETECTED (A) NOT DETECTED Final    Comment: CRITICAL RESULT CALLED TO, READ BACK BY AND VERIFIED WITH: Azzie Glatter PharmD 9:50 03/19/18 ([email or web address])    Klebsiella oxytoca NOT DETECTED NOT DETECTED Final   Klebsiella pneumoniae NOT DETECTED NOT DETECTED Final   Proteus species NOT DETECTED NOT DETECTED Final   Serratia marcescens NOT DETECTED NOT DETECTED Final   Carbapenem resistance NOT DETECTED NOT DETECTED Final   Haemophilus influenzae NOT DETECTED NOT DETECTED Final   Neisseria meningitidis NOT DETECTED NOT DETECTED Final   Pseudomonas aeruginosa NOT DETECTED NOT DETECTED Final   Candida albicans NOT DETECTED NOT DETECTED Final   Candida glabrata NOT DETECTED NOT DETECTED Final   Candida krusei NOT DETECTED NOT DETECTED Final   Candida parapsilosis NOT DETECTED NOT DETECTED Final   Candida tropicalis NOT DETECTED NOT DETECTED Final    Comment: Performed at [facility], [address] [place], [place] [address]  Surgical pcr screen     Status: Abnormal   Collection Time: 03/18/18 10:51 PM  Result Value Ref Range Status   MRSA, PCR POSITIVE (A) NEGATIVE Final    Comment: RESULT CALLED TO, READ BACK BY AND VERIFIED WITH: STEWART,S RN @0045  ON 03/19/2018 JACKSON,K    Staphylococcus aureus POSITIVE (A) NEGATIVE Final    Comment: (NOTE) The Xpert SA Assay (FDA approved  for NASAL specimens in patients 74 years of age and older), is one component of a comprehensive surveillance program. It is not intended to diagnose infection nor to guide or monitor treatment. Performed at [facility], [address] [place], [place] [address]   Urine culture     Status: None   Collection Time: 03/19/18  6:44 AM  Result Value Ref Range Status   Specimen Description   Final    URINE, CLEAN CATCH Performed at [facility], [address], [place], [place] [address]    Special Requests   Final    NONE Performed at [facility], [address], [place], [place] [address]    Culture   Final    NO GROWTH Performed at [facility], [address] [place], [place] [address]    Report Status 03/20/2018 FINAL  Final      Radiology Studies: Dg Chest Port 1 View  Result Date: 03/23/2018 CLINICAL DATA:  Pulmonary edema EXAM: PORTABLE CHEST 1 VIEW COMPARISON:  03/20/2018 FINDINGS: Right IJ central venous catheter tip is at the cavoatrial junction. There is shallow lung inflation with mild cardiomegaly. Mild pulmonary edema persists. Endotracheal tube and orogastric tube have been removed. IMPRESSION: Unchanged mild pulmonary edema, status post endotracheal extubation. Electronically Signed   By: Deatra Robinson M.D.   On: 03/23/2018 16:44     Medications:  Scheduled: . acyclovir ointment   Topical Q4H  .  amphetamine-dextroamphetamine  5 mg Oral Daily  . aspirin  81 mg Oral Daily  . benzonatate  200 mg Oral TID  . carvedilol  3.125 mg Oral BID WC  . Chlorhexidine Gluconate Cloth  6 each Topical Daily  . enoxaparin (LOVENOX) injection  40 mg Subcutaneous Daily  . famotidine  20 mg Oral BID  . fluticasone  1 spray Each Nare BID  . furosemide  20 mg Intravenous Once  . LORazepam  1 mg Oral QHS  . mouth rinse  15 mL Mouth Rinse BID  . polyethylene glycol  17 g Oral Daily  . potassium chloride  40 mEq Oral  Once  . ENSURE MAX PROTEIN  11 oz Oral Daily  . senna-docusate  2 tablet Oral BID  . sodium chloride flush  10-40 mL Intracatheter Q12H   Continuous: . sodium chloride Stopped (03/20/18 1940)  . cefTRIAXone (ROCEPHIN)  IV Stopped (03/23/18 1213)   WNU:UVOZDG chloride, acetaminophen, diphenhydrAMINE, docusate, guaiFENesin-dextromethorphan, morphine injection, ondansetron **OR** ondansetron (ZOFRAN) IV, oxyCODONE-acetaminophen, sodium chloride flush    Assessment/Plan:   Acute pyelonephritis with sepsis/left ureteral stone with obstruction Patient's noted to have E. coli bacteremia.  She was seen by urology due to the obstructing stone.  She underwent retrograde pyelogram and placement of left ureteral stent.  This was done on 1/16.  Based on sensitivities patient was transitioned to ceftriaxone.  She will need a ureteroscopy in February which will be arranged by urology.  From a sepsis standpoint patient has stabilized.  Antibiotics to be given for total of 10 days.  Acute cardiomyopathy, systolic and diastolic Patient noted to have ejection fraction of 30 to 35% with grade 2 diastolic dysfunction.  Cardiology is following.  Patient with some evidence for fluid overload.  Patient given Lasix yesterday.  Additional dose ordered by cardiology today.  Mildly abnormal troponin levels noted.  Cardiology plans work-up only once she has recovered from her acute illness.  This is reasonable.  For now patient remains on carvedilol.  Currently not on any ARB or ACE inhibitor.  Hypokalemia Low potassium level is due to diuretics.  Will be supplemented.  Chest pain Most likely due to a combination of cardiac abnormalities as well as sepsis as well as fluid overload.  She also is experiencing a lot of cough which is also contributing.  Even though troponins are elevated they are mild and not thought to be truly ischemic.  We will give her antitussive agents.  Thrombocytopenia Secondary to sepsis.   Continue to improve.  Insomnia Melatonin  Constipation MiraLAX.  Cold sores Acyclovir  Abnormal LFTs Most likely due to sepsis and septic shock.  Improving.  DVT Prophylaxis: Lovenox    Code Status: Full code Family Communication: Discussed with the patient Disposition Plan: Regimen as outlined above.  Mobilize.  Recheck labs tomorrow.  Okay for transfer to telemetry.    LOS: 6 days   Malan Werk [employer] on [email or web address]  03/24/2018, 11:29 AM

## 2018-03-24 NOTE — Progress Notes (Addendum)
6 Days Post-Op Subjective: Patient reports some bladder pain with voiding. She is feeling better. Possibly to floor today. I reviewed renal US and KUB images which shows stent working well and in good position.   Objective: Vital signs in last 24 hours: Temp:  [97.8 F (36.6 C)-99.6 F (37.6 C)] 98.1 F (36.7 C) (01/22 0400) Pulse Rate:  [62-118] 70 (01/22 0600) Resp:  [15-28] 15 (01/22 0400) BP: (97-115)/(28-75) 107/55 (01/22 0600) SpO2:  [88 %-100 %] 96 % (01/22 0600) Weight:  [93.1 kg] 93.1 kg (01/22 0500)  Intake/Output from previous day: 01/21 0701 - 01/22 0700 In: 99.9 [IV Piggyback:99.9] Out: 6850 [Urine:6850] Intake/Output this shift: No intake/output data recorded.  Physical Exam:  Talking to her mom Sitting in a chair  No focal deficits NAD, alert and oriented  I wanted to add, she may have been on the bedside commode - nurses were in room as chaperones    Lab Results: Recent Labs    03/22/18 1153 03/23/18 0424  HGB 11.7* 10.8*  HCT 36.6 33.8*   BMET Recent Labs    03/22/18 1153 03/24/18 0500  NA 140 139  K 3.4* 3.0*  CL 105 100  CO2 27 31  GLUCOSE 153* 100*  BUN 20 9  CREATININE 0.73 0.71  CALCIUM 8.0* 8.0*   No results for input(s): LABPT, INR in the last 72 hours. No results for input(s): LABURIN in the last 72 hours. Results for orders placed or performed during the hospital encounter of 03/18/18  Urine culture     Status: Abnormal   Collection Time: 03/18/18  1:45 PM  Result Value Ref Range Status   Specimen Description   Final    URINE, RANDOM Performed at [facility], [address], [place], [place] [address]    Special Requests   Final    NONE Performed at [facility], [address], [place], [place] [address]    Culture 80,000 COLONIES/mL ESCHERICHIA COLI (A)  Final   Report Status 03/21/2018 FINAL  Final   Organism ID, Bacteria ESCHERICHIA COLI (A)  Final      Susceptibility    Escherichia coli - MIC*    AMPICILLIN 8 SENSITIVE Sensitive     CEFAZOLIN <=4 SENSITIVE Sensitive     CEFTRIAXONE <=1 SENSITIVE Sensitive     CIPROFLOXACIN <=0.25 SENSITIVE Sensitive     GENTAMICIN <=1 SENSITIVE Sensitive     IMIPENEM <=0.25 SENSITIVE Sensitive     NITROFURANTOIN <=16 SENSITIVE Sensitive     TRIMETH/SULFA <=20 SENSITIVE Sensitive     AMPICILLIN/SULBACTAM 4 SENSITIVE Sensitive     PIP/TAZO <=4 SENSITIVE Sensitive     Extended ESBL NEGATIVE Sensitive     * 80,000 COLONIES/mL ESCHERICHIA COLI  Wet prep, genital     Status: Abnormal   Collection Time: 03/18/18  3:36 PM  Result Value Ref Range Status   Yeast Wet Prep HPF POC NONE SEEN NONE SEEN Final   Trich, Wet Prep NONE SEEN NONE SEEN Final   Clue Cells Wet Prep HPF POC NONE SEEN NONE SEEN Final   WBC, Wet Prep HPF POC RARE (A) NONE SEEN Final    Comment: Specimen diluted due to transport tube containing more than 1 ml of saline, interpret results with caution.   Sperm NONE SEEN  Final    Comment: Performed at [facility], [address], [place], [place] [address]  Blood Culture (routine x 2)     Status: Abnormal  Collection Time: 03/18/18  7:13 PM  Result Value Ref Range Status   Specimen Description   Final    BLOOD RIGHT FOREARM Performed at [facility], [address], [place], [place] [address]    Special Requests   Final    BOTTLES DRAWN AEROBIC AND ANAEROBIC Blood Culture adequate volume Performed at [facility], [address] [place], [place] [address]    Culture  Setup Time   Final    GRAM NEGATIVE RODS IN BOTH AEROBIC AND ANAEROBIC BOTTLES CRITICAL RESULT CALLED TO, READ BACK BY AND VERIFIED WITH: Azzie Glatter PharmD 9:50 03/19/18 (wilsonm) Performed at [facility], [address], [place], [place] [address]    Culture ESCHERICHIA COLI (A)  Final   Report Status 03/21/2018 FINAL  Final   Organism ID, Bacteria ESCHERICHIA COLI  Final       Susceptibility   Escherichia coli - MIC*    AMPICILLIN 4 SENSITIVE Sensitive     CEFAZOLIN <=4 SENSITIVE Sensitive     CEFEPIME <=1 SENSITIVE Sensitive     CEFTAZIDIME <=1 SENSITIVE Sensitive     CEFTRIAXONE <=1 SENSITIVE Sensitive     CIPROFLOXACIN <=0.25 SENSITIVE Sensitive     GENTAMICIN <=1 SENSITIVE Sensitive     IMIPENEM <=0.25 SENSITIVE Sensitive     TRIMETH/SULFA <=20 SENSITIVE Sensitive     AMPICILLIN/SULBACTAM <=2 SENSITIVE Sensitive     PIP/TAZO <=4 SENSITIVE Sensitive     Extended ESBL NEGATIVE Sensitive     * ESCHERICHIA COLI  Blood Culture (routine x 2)     Status: Abnormal   Collection Time: 03/18/18  7:13 PM  Result Value Ref Range Status   Specimen Description BLOOD LEFT HAND  Final   Special Requests   Final    BOTTLES DRAWN AEROBIC AND ANAEROBIC Blood Culture results may not be optimal due to an inadequate volume of blood received in culture bottles   Culture  Setup Time   Final    GRAM NEGATIVE RODS IN BOTH AEROBIC AND ANAEROBIC BOTTLES CRITICAL VALUE NOTED.  VALUE IS CONSISTENT WITH PREVIOUSLY REPORTED AND CALLED VALUE.    Culture (A)  Final    ESCHERICHIA COLI SUSCEPTIBILITIES PERFORMED ON PREVIOUS CULTURE WITHIN THE LAST 5 DAYS. Performed at [facility], [address] [place], [place] [address]    Report Status 03/21/2018 FINAL  Final  Blood Culture ID Panel (Reflexed)     Status: Abnormal   Collection Time: 03/18/18  7:13 PM  Result Value Ref Range Status   Enterococcus species NOT DETECTED NOT DETECTED Final   Listeria monocytogenes NOT DETECTED NOT DETECTED Final   Staphylococcus species NOT DETECTED NOT DETECTED Final   Staphylococcus aureus (BCID) NOT DETECTED NOT DETECTED Final   Streptococcus species NOT DETECTED NOT DETECTED Final   Streptococcus agalactiae NOT DETECTED NOT DETECTED Final   Streptococcus pneumoniae NOT DETECTED NOT DETECTED Final   Streptococcus pyogenes NOT DETECTED NOT DETECTED Final   Acinetobacter baumannii NOT  DETECTED NOT DETECTED Final   Enterobacteriaceae species DETECTED (A) NOT DETECTED Final    Comment: Enterobacteriaceae represent a large family of gram-negative bacteria, not a single organism. CRITICAL RESULT CALLED TO, READ BACK BY AND VERIFIED WITH: Azzie Glatter PharmD 9:50 03/19/18 (wilsonm)    Enterobacter cloacae complex NOT DETECTED NOT DETECTED Final   Escherichia coli DETECTED (A) NOT DETECTED Final    Comment: CRITICAL RESULT CALLED TO, READ BACK BY AND VERIFIED WITH: Azzie Glatter PharmD 9:50 03/19/18 (wilsonm)  Klebsiella oxytoca NOT DETECTED NOT DETECTED Final   Klebsiella pneumoniae NOT DETECTED NOT DETECTED Final   Proteus species NOT DETECTED NOT DETECTED Final   Serratia marcescens NOT DETECTED NOT DETECTED Final   Carbapenem resistance NOT DETECTED NOT DETECTED Final   Haemophilus influenzae NOT DETECTED NOT DETECTED Final   Neisseria meningitidis NOT DETECTED NOT DETECTED Final   Pseudomonas aeruginosa NOT DETECTED NOT DETECTED Final   Candida albicans NOT DETECTED NOT DETECTED Final   Candida glabrata NOT DETECTED NOT DETECTED Final   Candida krusei NOT DETECTED NOT DETECTED Final   Candida parapsilosis NOT DETECTED NOT DETECTED Final   Candida tropicalis NOT DETECTED NOT DETECTED Final    Comment: Performed at [facility], [address], [place], [place] [address]  Surgical pcr screen     Status: Abnormal   Collection Time: 03/18/18 10:51 PM  Result Value Ref Range Status   MRSA, PCR POSITIVE (A) NEGATIVE Final    Comment: RESULT CALLED TO, READ BACK BY AND VERIFIED WITH: STEWART,S RN @0045  ON 03/19/2018 JACKSON,K    Staphylococcus aureus POSITIVE (A) NEGATIVE Final    Comment: (NOTE) The Xpert SA Assay (FDA approved for NASAL specimens in patients 73 years of age and older), is one component of a comprehensive surveillance program. It is not intended to diagnose infection nor to guide or monitor treatment. Performed at [facility], [address], [place], [place] [address]   Urine culture     Status: None   Collection Time: 03/19/18  6:44 AM  Result Value Ref Range Status   Specimen Description   Final    URINE, CLEAN CATCH Performed at [facility], [address], [place], [place] [address]    Special Requests   Final    NONE Performed at [facility], [address] [place], [place] [address]    Culture   Final    NO GROWTH Performed at [facility], [address], [place], [place] [address]    Report Status 03/20/2018 FINAL  Final    Studies/Results: Dg Chest Port 1 View  Result Date: 03/23/2018 CLINICAL DATA:  Pulmonary edema EXAM: PORTABLE CHEST 1 VIEW COMPARISON:  03/20/2018 FINDINGS: Right IJ central venous catheter tip is at the cavoatrial junction. There is shallow lung inflation with mild cardiomegaly. Mild pulmonary edema persists. Endotracheal tube and orogastric tube have been removed. IMPRESSION: Unchanged mild pulmonary edema, status post endotracheal extubation. Electronically Signed   By: Deatra Robinson M.D.   On: 03/23/2018 16:44    Assessment/Plan: -left distal stone - she'll need left ureteroscopy in 2-3 weeks. I'll send a note to Dr. Melburn Popper and NP Jeraldine Loots re: pre-op clearance. Also, pt discussed stent pain and her paralysis in PACU.  UTI - continues abx for e coli UTI/bacteremia   LOS: 6 days   Jerilee Field 03/24/2018, 7:50 AM

## 2018-03-24 NOTE — Progress Notes (Addendum)
..   NAME:  Natsumi Clint, MRN:  [id number], DOB:  [birth year], LOS: 6 ADMISSION DATE:  03/18/2018, CONSULTATION DATE:  03/19/2018 REFERRING MD:  , CHIEF COMPLAINT:  Acute hypoxic resp failure   Brief History   33 yo female with PMHx sig for vit D def, recurrent UTI presented to [facility] on 1/16 with left flank pain found to have a Left ureteral stone with obstruction > emergent stent drained pus from L kidney but pt deteriorated with gm neg septic shock and ccm service requested.  Past Medical History  .Marland Kitchen Active Ambulatory Problems    Diagnosis Date Noted  . Recurrent UTI 02/13/2017  . Depression 02/13/2017  . ADHD 02/13/2017  . Migraines 02/16/2017  . Vitamin D deficiency 02/16/2017  . Preventative health care 02/16/2017  . Insomnia 02/16/2017    Hospital events   1/16 admitted with left flank pain found to have ureteral stone with obstruction underwent emergent stent, deteriorated with gram-negative septic shock and respiratory failure 1/17: Intubated for respiratory failure felt possibly pulmonary edema versus acute lung injury, profound metabolic acidosis necessitating bicarbonate drip 1/18 bicarbonate stopped, vasopressin discontinued, successfully extubated. 1/19: Stress dose steroids stopped levophed stopped, creatinine improving 1/20: All cultures showing E. coli, pansensitive.  Gentamicin discontinued. 1/21: Patient reporting chest discomfort with cough.  Twelve-lead shows low voltage question anterior changes.  Troponin elevated.  Peak 0.42.  Aspirin started, On morning rounds still complaining of chest discomfort particularly with deep breath, admitting echocardiogram reviewed demonstrates ejection fraction of 30 to 35% with moderate to severely reduced systolic function also noted grade 2 diastolic dysfunction.  Cardiology asked to evaluate for new cardiomyopathy and troponin elevation.  Lasix given per pulmonary edema, Coreg started. 03/24/18 uneventful night, Feeling better,  Still has some pain and discomfort  Consults:  Urology 03/18/2018 PCCM 03/19/2018  Procedures:  Cystoscopy with Retrograde Pyelogram/ Left Ureteral Stent Placement 03/19/2018 Endotracheally intubated 03/19/2018 Arterial line right brachial 03/19/2018 CVL R IJ  03/19/18      Significant Diagnostic Tests:    Micro Data:  MRSA PCR positive   03/18/2018 Blood cx  1/16 Pos x 2 = gram-negative rods, E. Coli pan sensitive . Enterobacter>>> UC 1/16 same e coli   Antimicrobials:  Vancomycin- 03/18/2018 x 1 dose  Rocephin 03/18/2018, 117 increased to 2 g IV every 24 hours>> Gentamicin 1/17 >>> stopped 1/19  Subjective  Still has dysuria when voiding particularly reports left flank pain still.  Also complaining of some shortness of breath and chest discomfort particularly with deep breath  Objective   Blood pressure (!) 105/59, pulse 84, temperature 98.7 F (37.1 C), temperature source Oral, resp. rate 20, height 5\' 2"  (1.575 m), weight 93.1 kg, last menstrual period 03/01/2018, SpO2 92 %.   Intake/Output Summary (Last 24 hours) at 03/24/2018 1007 Last data filed at 03/24/2018 0700 Gross per 24 hour  Intake 99.87 ml  Output 6200 ml  Net -6100.13 ml   Filed Weights   03/22/18 0500 03/23/18 0351 03/24/18 0500  Weight: 99.9 kg 98 kg 93.1 kg  Nasal cannula 3 L  Examination: General: 33 year old female patient resting in bed, comfortable  HEENT normocephalic atraumatic no jugular venous distention  Pulmonary: Diminished bases no accessory use, no rales Cardiac: Regular rate and rhythm without murmur rub or gallop Abdomen: Soft, nontender GU: Clear yellow still reports dysuria Neuro: Awake oriented no focal deficits Extremities: Warm dry brisk capillary refill, mild dependent edema     Anion gap metabolic acidosis 2/2 lactic acidosis Septic shock  Acute respiratory failure, extubated 1/18. Acute kidney injury serum creatinine normalized on 1/19  Assessment & Plan:   Left ureteral  stone with obstruction/pyelonephritis and e-coli urosepsis w/ associated e coli bacteremia  Cystoscopy with Retrograde Pyelogramm/ Left Uretral Stent Placement 1/16 Plan Day #  7 of 10 abx (ceftriaxone) Follow-up ureteroscopy in February  New cardiomyopathy, both systolic and diastolic.  EF 30 to 35%, estimated grade 2 diastolic dysfunction.  All in the setting of abnormal troponin level which is trending down Most likely reflects septic cardiomyopathy Appreciate cardiology input  Plan IV Lasix Continue telemetry PRN morphine for preload reduction, and chest discomfort.-Switch to Percocet  low-dose Coreg  Chest discomfort, ongoing hypoxia, with pulmonary edema Plan IV Lasix Wean oxygen Mobilize-tolerating mobilization well  Fluid and electrolyte imbalance Hypokalemia Plan Corrected  Thrombocytopenia likely related to sepsis -cont to improve Plan Trend cbc  Sleep onset insomnia/sleep maintenance insomnia Plan Added melatonin  PRN ativan   Constipation Plan Add miralax   Add Zovirax for cold sores  Best practice:  Diet: Regular diet Pain/Anxiety/Delirium protocol: Not indicated VAP protocol (if indicated):  N/a  DVT prophylaxis: lovenox  GI prophylaxis: PPi Glucose control: BG goal <13m Mobility: Ambulate as tolerated Code Status: FULL Family Communication: Discussed with family at bedside Disposition: Will be followed by triad, will see as needed  treating for new septic cardiomyopathy, she is on appropriate antibiotics at this point.    Ruthe Mannan pulmonary

## 2018-03-24 NOTE — Progress Notes (Addendum)
Progress Note  Patient Name: Stephanie Patrick Date of Encounter: 03/24/2018  Primary Cardiologist: Kristeen Miss, MD   Subjective   feeling better today, still with chest pain with cough and deep breath.  But she slept the best last pm - decreasing fluid is helping.     Inpatient Medications    Scheduled Meds: . amphetamine-dextroamphetamine  5 mg Oral Daily  . aspirin  81 mg Oral Daily  . carvedilol  3.125 mg Oral BID WC  . Chlorhexidine Gluconate Cloth  6 each Topical Daily  . enoxaparin (LOVENOX) injection  40 mg Subcutaneous Daily  . famotidine  20 mg Oral BID  . fluticasone  1 spray Each Nare BID  . LORazepam  1 mg Oral QHS  . mouth rinse  15 mL Mouth Rinse BID  . polyethylene glycol  17 g Oral Daily  . potassium chloride  40 mEq Oral Once  . ENSURE MAX PROTEIN  11 oz Oral Daily  . senna-docusate  2 tablet Oral BID  . sodium chloride flush  10-40 mL Intracatheter Q12H   Continuous Infusions: . sodium chloride Stopped (03/20/18 1940)  . cefTRIAXone (ROCEPHIN)  IV Stopped (03/23/18 1213)   PRN Meds: sodium chloride, acetaminophen, diphenhydrAMINE, docusate, guaiFENesin-dextromethorphan, morphine injection, ondansetron **OR** ondansetron (ZOFRAN) IV, sodium chloride flush   Vital Signs    Vitals:   03/24/18 0200 03/24/18 0400 03/24/18 0500 03/24/18 0600  BP: (!) 105/50 (!) 108/47  (!) 107/55  Pulse: 73 78 65 70  Resp: (!) 21 15    Temp:  98.1 F (36.7 C)    TempSrc:  Oral    SpO2: 90% (!) 88% 93% 96%  Weight:   93.1 kg   Height:        Intake/Output Summary (Last 24 hours) at 03/24/2018 0804 Last data filed at 03/24/2018 0700 Gross per 24 hour  Intake 99.87 ml  Output 6850 ml  Net -6750.13 ml   Last 3 Weights 03/24/2018 03/23/2018 03/22/2018  Weight (lbs) 205 lb 4 oz 216 lb 0.8 oz 220 lb 3.8 oz  Weight (kg) 93.1 kg 98 kg 99.9 kg      Telemetry    SR - Personally Reviewed  ECG    No new - Personally Reviewed  Physical Exam   GEN: No acute  distress.   Neck: No JVD Cardiac: RRR, no murmurs, rubs, or gallops.  Respiratory: Clear to diminished to auscultation bilaterally. GI: Soft, nontender, non-distended  MS: No edema; No deformity. Neuro:  Nonfocal  Psych: Normal affect   Labs    Chemistry Recent Labs  Lab 03/19/18 0348  03/21/18 0300 03/22/18 1153 03/24/18 0500  NA 139   < > 142 140 139  K 3.8   < > 4.1 3.4* 3.0*  CL 113*   < > 108 105 100  CO2 15*   < > 26 27 31   GLUCOSE 143*   < > 111* 153* 100*  BUN 12   < > 21* 20 9  CREATININE 1.07*   < > 0.80 0.73 0.71  CALCIUM 5.5*   < > 7.6* 8.0* 8.0*  PROT 4.5*  --   --  5.4* 5.2*  ALBUMIN 2.7*  --   --  2.5* 2.5*  AST 43*  --   --  92* 56*  ALT 39  --   --  321* 166*  ALKPHOS 52  --   --  149* 195*  BILITOT 1.9*  --   --  1.5* 1.2  GFRNONAA >60   < > >60 >60 >60  GFRAA >60   < > >60 >60 >60  ANIONGAP 11   < > 8 8 8    < > = values in this interval not displayed.     Hematology Recent Labs  Lab 03/21/18 0300 03/22/18 1153 03/23/18 0424  WBC 46.7* 55.7* 32.5*  RBC 3.35* 3.90 3.70*  HGB 10.1* 11.7* 10.8*  HCT 31.7* 36.6 33.8*  MCV 94.6 93.8 91.4  MCH 30.1 30.0 29.2  MCHC 31.9 32.0 32.0  RDW 14.1 14.0 13.7  PLT 77* 125* 148*    Cardiac Enzymes Recent Labs  Lab 03/23/18 0204 03/23/18 0618 03/23/18 1409  TROPONINI 0.42* 0.38* 0.30*   No results for input(s): TROPIPOC in the last 168 hours.   BNPNo results for input(s): BNP, PROBNP in the last 168 hours.   DDimer No results for input(s): DDIMER in the last 168 hours.   Radiology    Dg Chest Port 1 View  Result Date: 03/23/2018 CLINICAL DATA:  Pulmonary edema EXAM: PORTABLE CHEST 1 VIEW COMPARISON:  03/20/2018 FINDINGS: Right IJ central venous catheter tip is at the cavoatrial junction. There is shallow lung inflation with mild cardiomegaly. Mild pulmonary edema persists. Endotracheal tube and orogastric tube have been removed. IMPRESSION: Unchanged mild pulmonary edema, status post endotracheal  extubation. Electronically Signed   By: Deatra Robinson M.D.   On: 03/23/2018 16:44    Cardiac Studies   Echo  Echocardiogram 03/19/2018 Study Conclusions - Left ventricle: The cavity size was normal. Wall thickness was normal. Systolic function was moderately to severely reduced. The estimated ejection fraction was in the range of 30% to 35%. Images were inadequate for LV wall motion assessment. Features are consistent with a pseudonormal left ventricular filling pattern, with concomitant abnormal relaxation and increased filling pressure (grade 2 diastolic dysfunction). - Aortic valve: There was no stenosis. - Mitral valve: There was mild regurgitation. - Right ventricle: The cavity size was normal. Systolic function was mildly reduced. - Tricuspid valve: Peak RV-RA gradient (S): 21 mm Hg. - Pulmonary arteries: PA peak pressure: 29 mm Hg (S). - Systemic veins: IVC measured 1.9 cm with < 50% respirophasic variation, suggesting RA pressure 8 mmHg.  Impressions: - Very poor quality echo even with Definity. LV EF is estimated in 30-35% range but would suggest TEE or cardiac MRI to get better view. Moderate diastolic dysfunction. Normal RV size wtih mildly decreased systolic function. Mild mitral regurgitation.   Patient Profile     33 y.o. female with a hx of ADHD, migraines, depression, recurrent UTI and vitamin D deficiency now admitted with sepsis with lt ureteral stone with obstruction, s/p cysto with ureteral stent placement.  Intubation and hypotension, now decreased EF.      Assessment & Plan    Cardiomyopathy with EF 30-35% may be due to sepsis syndrome.  Now on coreg 3.125 BID and diuresing.   Plan to see post hospitalization and re-evaluate.  --pt was +[phone] and now only + 487. Improving with pk wt 214 lbs and now 204 lbs.  On admit if correct wt was 174 lbs.    --BP soft may be diffiuclt to increase meds.   --may need another dose of lasix today.      Chest discomfort - associated with cough.  Atypical. Will re eval. Once condition improved.    elevated troponins  Minimal and flat.    Hypokalemia per IM      For questions or updates, please contact  [facility] Please consult [email or web address] for contact info under        Signed, Nada Boozer, NP  03/24/2018, 8:04 AM    Attending Note:   The patient was seen and examined.  Agree with assessment and plan as noted above.  Changes made to the above note as needed.  Patient seen and independently examined with Nada Boozer, NP .   We discussed all aspects of the encounter. I agree with the assessment and plan as stated above.  1.  Acute systolic congestive heart failure: I suspect that her decreased ejection fraction is due to sepsis syndrome plus massive volume that she received for treatment of her sepsis. Has diuresed nicely and is breathing quite a bit better.  She still appears to be volume overloaded.  We will give her Lasix 20 mg IV this morning. We will reassess tomorrow. She needs more potassium supplementation.  Dr. Mena Goes has requested surgical clearence for removal of her ureteral stent which will take place in several weeks      I have spent a total of 40 minutes with patient reviewing hospital  notes , telemetry, EKGs, labs and examining patient as well as establishing an assessment and plan that was discussed with the patient. > 50% of time was spent in direct patient care.    Vesta Mixer, Montez Hageman., MD, [facility] 03/24/2018, 11:10 AM [address],  [address] Office [phone] Pager [phone]

## 2018-03-25 ENCOUNTER — Inpatient Hospital Stay (HOSPITAL_COMMUNITY): Payer: Self-pay

## 2018-03-25 DIAGNOSIS — I5021 Acute systolic (congestive) heart failure: Secondary | ICD-10-CM

## 2018-03-25 DIAGNOSIS — J9600 Acute respiratory failure, unspecified whether with hypoxia or hypercapnia: Secondary | ICD-10-CM

## 2018-03-25 DIAGNOSIS — R05 Cough: Secondary | ICD-10-CM

## 2018-03-25 HISTORY — DX: Acute respiratory failure, unspecified whether with hypoxia or hypercapnia: J96.00

## 2018-03-25 LAB — BASIC METABOLIC PANEL
Anion gap: 10 (ref 5–15)
BUN: 13 mg/dL (ref 6–20)
CO2: 26 mmol/L (ref 22–32)
Calcium: 8.4 mg/dL — ABNORMAL LOW (ref 8.9–10.3)
Chloride: 102 mmol/L (ref 98–111)
Creatinine, Ser: 0.75 mg/dL (ref 0.44–1.00)
GFR calc Af Amer: >60 mL/min (ref >60)
GFR calc non Af Amer: >60 mL/min (ref >60)
Glucose, Bld: 107 mg/dL — ABNORMAL HIGH (ref 70–99)
Potassium: 3.6 mmol/L (ref 3.5–5.1)
Sodium: 138 mmol/L (ref 135–145)

## 2018-03-25 LAB — CBC
HCT: 37.5 % (ref 36.0–46.0)
Hemoglobin: 12 g/dL (ref 12.0–15.0)
MCH: 29.8 pg (ref 26.0–34.0)
MCHC: 32 g/dL (ref 30.0–36.0)
MCV: 93.1 fL (ref 80.0–100.0)
NRBC: 0.2 % (ref 0.0–0.2)
Platelets: 202 10*3/uL (ref 150–400)
RBC: 4.03 MIL/uL (ref 3.87–5.11)
RDW: 13.6 % (ref 11.5–15.5)
WBC: 32.1 10*3/uL — ABNORMAL HIGH (ref 4.0–10.5)

## 2018-03-25 LAB — ECHOCARDIOGRAM LIMITED
Height: 62 in
Weight: 3086.44 oz

## 2018-03-25 LAB — MAGNESIUM: Magnesium: 2 mg/dL (ref 1.7–2.4)

## 2018-03-25 MED ORDER — ACETAMINOPHEN 325 MG PO TABS
650.0000 mg | ORAL_TABLET | Freq: Four times a day (QID) | ORAL | Status: DC | PRN
  Administered 2018-03-26: 650 mg via ORAL
  Filled 2018-03-25: qty 2

## 2018-03-25 MED ORDER — MORPHINE SULFATE (PF) 2 MG/ML IV SOLN: 1.0000 mg | INTRAVENOUS | Status: DC | PRN

## 2018-03-25 MED ORDER — LORAZEPAM 1 MG PO TABS
1.0000 mg | ORAL_TABLET | Freq: Every day | ORAL | Status: DC
  Administered 2018-03-25 – 2018-03-26 (×2): 1 mg via ORAL
  Filled 2018-03-25 (×2): qty 1

## 2018-03-25 MED ORDER — FLUTICASONE PROPIONATE 50 MCG/ACT NA SUSP
2.0000 | Freq: Two times a day (BID) | NASAL | Status: DC
  Administered 2018-03-25 – 2018-03-26 (×4): 2 via NASAL
  Filled 2018-03-25: qty 16

## 2018-03-25 MED ORDER — POTASSIUM CHLORIDE CRYS ER 20 MEQ PO TBCR
40.0000 meq | EXTENDED_RELEASE_TABLET | Freq: Once | ORAL | Status: AC
  Administered 2018-03-25: 40 meq via ORAL
  Filled 2018-03-25: qty 2

## 2018-03-25 MED ORDER — HYDROCOD POLST-CPM POLST ER 10-8 MG/5ML PO SUER
5.0000 mL | Freq: Two times a day (BID) | ORAL | Status: DC
  Administered 2018-03-25 – 2018-03-27 (×5): 5 mL via ORAL
  Filled 2018-03-25 (×5): qty 5

## 2018-03-25 MED ORDER — OXYCODONE-ACETAMINOPHEN 5-325 MG PO TABS
1.0000 | ORAL_TABLET | ORAL | Status: DC | PRN
  Administered 2018-03-25 – 2018-03-27 (×3): 1 via ORAL
  Filled 2018-03-25 (×4): qty 1

## 2018-03-25 NOTE — Progress Notes (Signed)
7 Days Post-Op Subjective: She showered and it really tired her out. Echo repeated. WBC trending down. No fever.   Objective: Vital signs in last 24 hours: Temp:  [97.6 F (36.4 C)-98.4 F (36.9 C)] 97.6 F (36.4 C) (01/23 1309) Pulse Rate:  [65-82] 65 (01/23 1309) Resp:  [18] 18 (01/23 0536) BP: (94-103)/(49-63) 94/49 (01/23 1309) SpO2:  [90 %] 90 % (01/23 1309) Weight:  [87.5 kg] 87.5 kg (01/23 0536)  Intake/Output from previous day: 01/22 0701 - 01/23 0700 In: 660 [P.O.:660] Out: 800 [Urine:800] Intake/Output this shift: Total I/O In: 360 [P.O.:360] Out: 300 [Urine:300]  Physical Exam:  NAD Resting in bed and watching TV - wants to get a "margarita" when all is said and done   Lab Results: Recent Labs    03/23/18 0424 03/25/18 0650  HGB 10.8* 12.0  HCT 33.8* 37.5   BMET Recent Labs    03/24/18 0500 03/25/18 0650  NA 139 138  K 3.0* 3.6  CL 100 102  CO2 31 26  GLUCOSE 100* 107*  BUN 9 13  CREATININE 0.71 0.75  CALCIUM 8.0* 8.4*   No results for input(s): LABPT, INR in the last 72 hours. No results for input(s): LABURIN in the last 72 hours. Results for orders placed or performed during the hospital encounter of 03/18/18  Urine culture     Status: Abnormal   Collection Time: 03/18/18  1:45 PM  Result Value Ref Range Status   Specimen Description   Final    URINE, RANDOM Performed at [facility], [address] [place], [place] [address]    Special Requests   Final    NONE Performed at [facility], [address] [place], [place] [address]    Culture 80,000 COLONIES/mL ESCHERICHIA COLI (A)  Final   Report Status 03/21/2018 FINAL  Final   Organism ID, Bacteria ESCHERICHIA COLI (A)  Final      Susceptibility   Escherichia coli - MIC*    AMPICILLIN 8 SENSITIVE Sensitive     CEFAZOLIN <=4 SENSITIVE Sensitive     CEFTRIAXONE <=1 SENSITIVE Sensitive     CIPROFLOXACIN <=0.25 SENSITIVE Sensitive     GENTAMICIN  <=1 SENSITIVE Sensitive     IMIPENEM <=0.25 SENSITIVE Sensitive     NITROFURANTOIN <=16 SENSITIVE Sensitive     TRIMETH/SULFA <=20 SENSITIVE Sensitive     AMPICILLIN/SULBACTAM 4 SENSITIVE Sensitive     PIP/TAZO <=4 SENSITIVE Sensitive     Extended ESBL NEGATIVE Sensitive     * 80,000 COLONIES/mL ESCHERICHIA COLI  Wet prep, genital     Status: Abnormal   Collection Time: 03/18/18  3:36 PM  Result Value Ref Range Status   Yeast Wet Prep HPF POC NONE SEEN NONE SEEN Final   Trich, Wet Prep NONE SEEN NONE SEEN Final   Clue Cells Wet Prep HPF POC NONE SEEN NONE SEEN Final   WBC, Wet Prep HPF POC RARE (A) NONE SEEN Final    Comment: Specimen diluted due to transport tube containing more than 1 ml of saline, interpret results with caution.   Sperm NONE SEEN  Final    Comment: Performed at [facility], [address], [place], [place] [address]  Blood Culture (routine x 2)     Status: Abnormal   Collection Time: 03/18/18  7:13 PM  Result Value Ref Range Status   Specimen Description   Final    BLOOD RIGHT FOREARM Performed at [facility], [address] [place], [place] [address]    Special Requests   Final    BOTTLES DRAWN AEROBIC AND ANAEROBIC Blood Culture adequate volume Performed at [facility], [address] [place], [place] [address]    Culture  Setup Time   Final    GRAM NEGATIVE RODS IN BOTH AEROBIC AND ANAEROBIC BOTTLES CRITICAL RESULT CALLED TO, READ BACK BY AND VERIFIED WITH: Azzie Glatter PharmD 9:50 03/19/18 ([email or web address]) Performed at [facility], [address], [place], [place] [address]    Culture ESCHERICHIA COLI (A)  Final   Report Status 03/21/2018 FINAL  Final   Organism ID, Bacteria ESCHERICHIA COLI  Final      Susceptibility   Escherichia coli - MIC*    AMPICILLIN 4 SENSITIVE Sensitive     CEFAZOLIN <=4 SENSITIVE Sensitive     CEFEPIME <=1 SENSITIVE Sensitive     CEFTAZIDIME <=1 SENSITIVE Sensitive      CEFTRIAXONE <=1 SENSITIVE Sensitive     CIPROFLOXACIN <=0.25 SENSITIVE Sensitive     GENTAMICIN <=1 SENSITIVE Sensitive     IMIPENEM <=0.25 SENSITIVE Sensitive     TRIMETH/SULFA <=20 SENSITIVE Sensitive     AMPICILLIN/SULBACTAM <=2 SENSITIVE Sensitive     PIP/TAZO <=4 SENSITIVE Sensitive     Extended ESBL NEGATIVE Sensitive     * ESCHERICHIA COLI  Blood Culture (routine x 2)     Status: Abnormal   Collection Time: 03/18/18  7:13 PM  Result Value Ref Range Status   Specimen Description BLOOD LEFT HAND  Final   Special Requests   Final    BOTTLES DRAWN AEROBIC AND ANAEROBIC Blood Culture results may not be optimal due to an inadequate volume of blood received in culture bottles   Culture  Setup Time   Final    GRAM NEGATIVE RODS IN BOTH AEROBIC AND ANAEROBIC BOTTLES CRITICAL VALUE NOTED.  VALUE IS CONSISTENT WITH PREVIOUSLY REPORTED AND CALLED VALUE.    Culture (A)  Final    ESCHERICHIA COLI SUSCEPTIBILITIES PERFORMED ON PREVIOUS CULTURE WITHIN THE LAST 5 DAYS. Performed at [facility], [address] [place], [place] [address]    Report Status 03/21/2018 FINAL  Final  Blood Culture ID Panel (Reflexed)     Status: Abnormal   Collection Time: 03/18/18  7:13 PM  Result Value Ref Range Status   Enterococcus species NOT DETECTED NOT DETECTED Final   Listeria monocytogenes NOT DETECTED NOT DETECTED Final   Staphylococcus species NOT DETECTED NOT DETECTED Final   Staphylococcus aureus (BCID) NOT DETECTED NOT DETECTED Final   Streptococcus species NOT DETECTED NOT DETECTED Final   Streptococcus agalactiae NOT DETECTED NOT DETECTED Final   Streptococcus pneumoniae NOT DETECTED NOT DETECTED Final   Streptococcus pyogenes NOT DETECTED NOT DETECTED Final   Acinetobacter baumannii NOT DETECTED NOT DETECTED Final   Enterobacteriaceae species DETECTED (A) NOT DETECTED Final    Comment: Enterobacteriaceae represent a large family of gram-negative bacteria, not a single  organism. CRITICAL RESULT CALLED TO, READ BACK BY AND VERIFIED WITH: Azzie Glatter PharmD 9:50 03/19/18 ([email or web address])    Enterobacter cloacae complex NOT DETECTED NOT DETECTED Final   Escherichia coli DETECTED (A) NOT DETECTED Final    Comment: CRITICAL RESULT CALLED TO, READ BACK BY AND VERIFIED WITH: Azzie Glatter PharmD 9:50 03/19/18 ([email or web address])    Klebsiella oxytoca NOT DETECTED NOT DETECTED Final   Klebsiella pneumoniae NOT DETECTED NOT DETECTED Final   Proteus species NOT DETECTED NOT DETECTED Final   Serratia marcescens NOT  DETECTED NOT DETECTED Final   Carbapenem resistance NOT DETECTED NOT DETECTED Final   Haemophilus influenzae NOT DETECTED NOT DETECTED Final   Neisseria meningitidis NOT DETECTED NOT DETECTED Final   Pseudomonas aeruginosa NOT DETECTED NOT DETECTED Final   Candida albicans NOT DETECTED NOT DETECTED Final   Candida glabrata NOT DETECTED NOT DETECTED Final   Candida krusei NOT DETECTED NOT DETECTED Final   Candida parapsilosis NOT DETECTED NOT DETECTED Final   Candida tropicalis NOT DETECTED NOT DETECTED Final    Comment: Performed at [facility], [address], [place], [place] [address]  Surgical pcr screen     Status: Abnormal   Collection Time: 03/18/18 10:51 PM  Result Value Ref Range Status   MRSA, PCR POSITIVE (A) NEGATIVE Final    Comment: RESULT CALLED TO, READ BACK BY AND VERIFIED WITH: STEWART,S RN @0045  ON 03/19/2018 JACKSON,K    Staphylococcus aureus POSITIVE (A) NEGATIVE Final    Comment: (NOTE) The Xpert SA Assay (FDA approved for NASAL specimens in patients 42 years of age and older), is one component of a comprehensive surveillance program. It is not intended to diagnose infection nor to guide or monitor treatment. Performed at [facility], [address] [place], [place] [address]   Urine culture     Status: None   Collection Time: 03/19/18  6:44 AM  Result Value Ref Range Status   Specimen Description   Final    URINE,  CLEAN CATCH Performed at [facility], [address] [place], [place] [address]    Special Requests   Final    NONE Performed at [facility], [address] [place], [place] [address]    Culture   Final    NO GROWTH Performed at [facility], [address] [place], [place] [address]    Report Status 03/20/2018 FINAL  Final    Studies/Results: Dg Chest Port 1 View  Result Date: 03/23/2018 CLINICAL DATA:  Pulmonary edema EXAM: PORTABLE CHEST 1 VIEW COMPARISON:  03/20/2018 FINDINGS: Right IJ central venous catheter tip is at the cavoatrial junction. There is shallow lung inflation with mild cardiomegaly. Mild pulmonary edema persists. Endotracheal tube and orogastric tube have been removed. IMPRESSION: Unchanged mild pulmonary edema, status post endotracheal extubation. Electronically Signed   By: Deatra Robinson M.D.   On: 03/23/2018 16:44    Assessment/Plan: -left distal stone - she'll need left ureteroscopy in 2-3 weeks. Appreciate Dr. Elease Hashimoto trying to clear pt for ureteroscopy with another echo.  UTI - continues abx for e coli UTI/bacteremia  My office will call with follow-up. I will sign off, but please page GU with any questions or concerns.    LOS: 7 days   Jerilee Field 03/25/2018, 4:13 PM

## 2018-03-25 NOTE — Progress Notes (Signed)
[facility] PROGRESS NOTE  Stephanie Patrick ZOX:[id number] DOB: 1985-04-17 DOA: 03/18/2018  PCP: Sandford Craze, NP  Brief History/Interval Summary: 33 yo female with PMHx sig for vit D def, recurrent UTI presented to [facility] on 1/16 with left flank pain found to have a Left ureteral stone with obstruction > emergent stent drained pus from L kidney but pt deteriorated with gm neg septic shock.  Patient was initially admitted to the intensive care unit.  She had to be intubated for airway protection.  She was successfully extubated.  Has been stable for the past many days.  Transferred to the hospitalist service.  Reason for Visit: Acute pyelonephritis.  E. coli sepsis.  Consultants: Urology.  Critical care medicine.   Hospital events   1/16 admitted with left flank pain found to have ureteral stone with obstruction underwent emergent stent, deteriorated with gram-negative septic shock and respiratory failure 1/17: Intubated for respiratory failure felt possibly pulmonary edema versus acute lung injury, profound metabolic acidosis necessitating bicarbonate drip 1/18 bicarbonate stopped, vasopressin discontinued, successfully extubated. 1/19: Stress dose steroids stopped levophed stopped, creatinine improving 1/20: All cultures showing E. coli, pansensitive.  Gentamicin discontinued. 1/21: Patient reporting chest discomfort with cough.  Twelve-lead shows low voltage question anterior changes.  Troponin elevated.  Peak 0.42.  Aspirin started, On morning rounds still complaining of chest discomfort particularly with deep breath, admitting echocardiogram reviewed demonstrates ejection fraction of 30 to 35% with moderate to severely reduced systolic function also noted grade 2 diastolic dysfunction.  Cardiology asked to evaluate for new cardiomyopathy and troponin elevation.  Lasix given per pulmonary edema, Coreg started. 03/24/18 uneventful night, Feeling better, Still has some pain and  discomfort  Consults:  Urology 03/18/2018 PCCM 03/19/2018  Procedures:  Cystoscopy with Retrograde Pyelogram/ Left Ureteral Stent Placement 03/19/2018 Endotracheally intubated 03/19/2018 Arterial line right brachial 03/19/2018 CVL R IJ  03/19/18      Antimicrobials:  Vancomycin- 03/18/2018 x 1 dose  Rocephin 03/18/2018, 117 increased to 2 g IV every 24 hours>> Gentamicin 1/17 >>> stopped 1/19   Antibiotics: Ceftriaxone  Subjective/Interval History: Patient continues to have a cough which is bothersome and causing her to have pain in her chest.  It is a dry cough now.  Denies any nausea vomiting.  Has been ambulating in the room.    ROS: Denies any headaches  Objective:  Vital Signs  Vitals:   03/24/18 1303 03/24/18 2043 03/25/18 0536 03/25/18 0926  BP: 112/78 101/62 103/63   Pulse: 89 80 82 81  Resp: (!) 23 18 18    Temp: 97.9 F (36.6 C) 97.8 F (36.6 C) 98.4 F (36.9 C)   TempSrc: Oral Oral Oral   SpO2: 92% 90% 90%   Weight:   87.5 kg   Height:        Intake/Output Summary (Last 24 hours) at 03/25/2018 1140 Last data filed at 03/25/2018 1120 Gross per 24 hour  Intake 900 ml  Output 1100 ml  Net -200 ml   Filed Weights   03/23/18 0351 03/24/18 0500 03/25/18 0536  Weight: 98 kg 93.1 kg 87.5 kg   General appearance: Awake alert.  In no distress Resp: Normal effort.  Few crackles at the bases but much improved air entry bilaterally.  No wheezing or rhonchi. Cardio: S1-S2 is normal regular.  No S3-S4.  No rubs murmurs or bruit.  Telemetry did not show any arrhythmias. GI: Abdomen is soft.  Nontender nondistended.  Bowel sounds are present normal.  No masses organomegaly Extremities: No  edema.  Full range of motion of lower extremities. Neurologic: Alert and oriented x3.  No focal neurological deficits.   Lab Results:  Data Reviewed: I have personally reviewed following labs and imaging studies  CBC: Recent Labs  Lab 03/20/18 0500 03/21/18 0300  03/22/18 1153 03/23/18 0424 03/25/18 0650  WBC 39.1* 46.7* 55.7* 32.5* 32.1*  NEUTROABS 29.8*  --  46.5* 22.7*  --   HGB 10.8* 10.1* 11.7* 10.8* 12.0  HCT 33.7* 31.7* 36.6 33.8* 37.5  MCV 92.3 94.6 93.8 91.4 93.1  PLT 64* 77* 125* 148* 202    Basic Metabolic Panel: Recent Labs  Lab 03/19/18 1620 03/20/18 0500 03/20/18 1709 03/21/18 0300 03/22/18 1153 03/24/18 0500 03/25/18 0650  NA  --  145  --  142 140 139 138  K  --  4.5  --  4.1 3.4* 3.0* 3.6  CL  --  108  --  108 105 100 102  CO2  --  24  --  26 27 31 26   GLUCOSE  --  153*  --  111* 153* 100* 107*  BUN  --  19  --  21* 20 9 13   CREATININE  --  1.07*  --  0.80 0.73 0.71 0.75  CALCIUM  --  7.2*  --  7.6* 8.0* 8.0* 8.4*  MG 1.4* 2.0 2.1 2.3  --   --  2.0  PHOS 3.5 2.5 1.9* 2.1*  --   --   --     GFR: Estimated Creatinine Clearance: 103.8 mL/min (by C-G formula based on SCr of 0.75 mg/dL).  Liver Function Tests: Recent Labs  Lab 03/18/18 1343 03/19/18 0348 03/22/18 1153 03/24/18 0500  AST 74* 43* 92* 56*  ALT 78* 39 321* 166*  ALKPHOS 100 52 149* 195*  BILITOT 1.4* 1.9* 1.5* 1.2  PROT 8.1 4.5* 5.4* 5.2*  ALBUMIN 4.9 2.7* 2.5* 2.5*    Recent Labs  Lab 03/18/18 1343  LIPASE 29    Coagulation Profile: Recent Labs  Lab 03/19/18 0348  INR 1.59    Cardiac Enzymes: Recent Labs  Lab 03/23/18 0204 03/23/18 0618 03/23/18 1409  TROPONINI 0.42* 0.38* 0.30*    CBG: Recent Labs  Lab 03/23/18 1634 03/23/18 1925 03/23/18 2327 03/24/18 0339 03/24/18 0804  GLUCAP 88 95 96 110* 91     Recent Results (from the past 240 hour(s))  Urine culture     Status: Abnormal   Collection Time: 03/18/18  1:45 PM  Result Value Ref Range Status   Specimen Description   Final    URINE, RANDOM Performed at [facility], [address], [place], [place] [address]    Special Requests   Final    NONE Performed at [facility], [address], [place], [place] [address]     Culture 80,000 COLONIES/mL ESCHERICHIA COLI (A)  Final   Report Status 03/21/2018 FINAL  Final   Organism ID, Bacteria ESCHERICHIA COLI (A)  Final      Susceptibility   Escherichia coli - MIC*    AMPICILLIN 8 SENSITIVE Sensitive     CEFAZOLIN <=4 SENSITIVE Sensitive     CEFTRIAXONE <=1 SENSITIVE Sensitive     CIPROFLOXACIN <=0.25 SENSITIVE Sensitive     GENTAMICIN <=1 SENSITIVE Sensitive     IMIPENEM <=0.25 SENSITIVE Sensitive     NITROFURANTOIN <=16 SENSITIVE Sensitive     TRIMETH/SULFA <=20 SENSITIVE Sensitive     AMPICILLIN/SULBACTAM 4 SENSITIVE Sensitive     PIP/TAZO <=4 SENSITIVE Sensitive  Extended ESBL NEGATIVE Sensitive     * 80,000 COLONIES/mL ESCHERICHIA COLI  Wet prep, genital     Status: Abnormal   Collection Time: 03/18/18  3:36 PM  Result Value Ref Range Status   Yeast Wet Prep HPF POC NONE SEEN NONE SEEN Final   Trich, Wet Prep NONE SEEN NONE SEEN Final   Clue Cells Wet Prep HPF POC NONE SEEN NONE SEEN Final   WBC, Wet Prep HPF POC RARE (A) NONE SEEN Final    Comment: Specimen diluted due to transport tube containing more than 1 ml of saline, interpret results with caution.   Sperm NONE SEEN  Final    Comment: Performed at [facility], [address] [place], [place] [address]  Blood Culture (routine x 2)     Status: Abnormal   Collection Time: 03/18/18  7:13 PM  Result Value Ref Range Status   Specimen Description   Final    BLOOD RIGHT FOREARM Performed at [facility], [address] [place], [place] [address]    Special Requests   Final    BOTTLES DRAWN AEROBIC AND ANAEROBIC Blood Culture adequate volume Performed at [facility], [address] [place], [place] [address]    Culture  Setup Time   Final    GRAM NEGATIVE RODS IN BOTH AEROBIC AND ANAEROBIC BOTTLES CRITICAL RESULT CALLED TO, READ BACK BY AND VERIFIED WITH: Azzie GlatterJ. Legge PharmD 9:50 03/19/18 ([email or web address]) Performed at [facility], [address] [place], [place] [address]    Culture ESCHERICHIA COLI (A)  Final   Report Status 03/21/2018 FINAL  Final   Organism ID, Bacteria ESCHERICHIA COLI  Final      Susceptibility   Escherichia coli - MIC*    AMPICILLIN 4 SENSITIVE Sensitive     CEFAZOLIN <=4 SENSITIVE Sensitive     CEFEPIME <=1 SENSITIVE Sensitive     CEFTAZIDIME <=1 SENSITIVE Sensitive     CEFTRIAXONE <=1 SENSITIVE Sensitive     CIPROFLOXACIN <=0.25 SENSITIVE Sensitive     GENTAMICIN <=1 SENSITIVE Sensitive     IMIPENEM <=0.25 SENSITIVE Sensitive     TRIMETH/SULFA <=20 SENSITIVE Sensitive     AMPICILLIN/SULBACTAM <=2 SENSITIVE Sensitive     PIP/TAZO <=4 SENSITIVE Sensitive     Extended ESBL NEGATIVE Sensitive     * ESCHERICHIA COLI  Blood Culture (routine x 2)     Status: Abnormal   Collection Time: 03/18/18  7:13 PM  Result Value Ref Range Status   Specimen Description BLOOD LEFT HAND  Final   Special Requests   Final    BOTTLES DRAWN AEROBIC AND ANAEROBIC Blood Culture results may not be optimal due to an inadequate volume of blood received in culture bottles   Culture  Setup Time   Final    GRAM NEGATIVE RODS IN BOTH AEROBIC AND ANAEROBIC BOTTLES CRITICAL VALUE NOTED.  VALUE IS CONSISTENT WITH PREVIOUSLY REPORTED AND CALLED VALUE.    Culture (A)  Final    ESCHERICHIA COLI SUSCEPTIBILITIES PERFORMED ON PREVIOUS CULTURE WITHIN THE LAST 5 DAYS. Performed at [facility], [address] [place], [place] [address]    Report Status 03/21/2018 FINAL  Final  Blood Culture ID Panel (Reflexed)     Status: Abnormal   Collection Time: 03/18/18  7:13 PM  Result Value Ref Range Status   Enterococcus species NOT DETECTED NOT DETECTED Final   Listeria monocytogenes NOT DETECTED NOT DETECTED Final   Staphylococcus species  NOT DETECTED NOT DETECTED Final   Staphylococcus aureus (BCID) NOT DETECTED NOT DETECTED Final   Streptococcus species NOT DETECTED NOT DETECTED Final   Streptococcus agalactiae NOT DETECTED NOT  DETECTED Final   Streptococcus pneumoniae NOT DETECTED NOT DETECTED Final   Streptococcus pyogenes NOT DETECTED NOT DETECTED Final   Acinetobacter baumannii NOT DETECTED NOT DETECTED Final   Enterobacteriaceae species DETECTED (A) NOT DETECTED Final    Comment: Enterobacteriaceae represent a large family of gram-negative bacteria, not a single organism. CRITICAL RESULT CALLED TO, READ BACK BY AND VERIFIED WITH: Azzie Glatter PharmD 9:50 03/19/18 ([email or web address])    Enterobacter cloacae complex NOT DETECTED NOT DETECTED Final   Escherichia coli DETECTED (A) NOT DETECTED Final    Comment: CRITICAL RESULT CALLED TO, READ BACK BY AND VERIFIED WITH: Azzie Glatter PharmD 9:50 03/19/18 (wilsonm)    Klebsiella oxytoca NOT DETECTED NOT DETECTED Final   Klebsiella pneumoniae NOT DETECTED NOT DETECTED Final   Proteus species NOT DETECTED NOT DETECTED Final   Serratia marcescens NOT DETECTED NOT DETECTED Final   Carbapenem resistance NOT DETECTED NOT DETECTED Final   Haemophilus influenzae NOT DETECTED NOT DETECTED Final   Neisseria meningitidis NOT DETECTED NOT DETECTED Final   Pseudomonas aeruginosa NOT DETECTED NOT DETECTED Final   Candida albicans NOT DETECTED NOT DETECTED Final   Candida glabrata NOT DETECTED NOT DETECTED Final   Candida krusei NOT DETECTED NOT DETECTED Final   Candida parapsilosis NOT DETECTED NOT DETECTED Final   Candida tropicalis NOT DETECTED NOT DETECTED Final    Comment: Performed at [facility], [address] [place], [place] [address]  Surgical pcr screen     Status: Abnormal   Collection Time: 03/18/18 10:51 PM  Result Value Ref Range Status   MRSA, PCR POSITIVE (A) NEGATIVE Final    Comment: RESULT CALLED TO, READ BACK BY AND VERIFIED WITH: STEWART,S RN @0045  ON 03/19/2018 JACKSON,K    Staphylococcus aureus POSITIVE (A) NEGATIVE Final    Comment: (NOTE) The Xpert SA Assay (FDA approved for NASAL specimens in patients 20 years of age and older), is one component of a  comprehensive surveillance program. It is not intended to diagnose infection nor to guide or monitor treatment. Performed at [facility], [address], [place], [place] [address]   Urine culture     Status: None   Collection Time: 03/19/18  6:44 AM  Result Value Ref Range Status   Specimen Description   Final    URINE, CLEAN CATCH Performed at [facility], [address] [place], [place] [address]    Special Requests   Final    NONE Performed at [facility], [address] [place], [place] [address]    Culture   Final    NO GROWTH Performed at [facility], [address], [place], [place] [address]    Report Status 03/20/2018 FINAL  Final      Radiology Studies: Dg Chest Port 1 View  Result Date: 03/23/2018 CLINICAL DATA:  Pulmonary edema EXAM: PORTABLE CHEST 1 VIEW COMPARISON:  03/20/2018 FINDINGS: Right IJ central venous catheter tip is at the cavoatrial junction. There is shallow lung inflation with mild cardiomegaly. Mild pulmonary edema persists. Endotracheal tube and orogastric tube have been removed. IMPRESSION: Unchanged mild pulmonary edema, status post endotracheal extubation. Electronically Signed   By: Deatra Robinson M.D.   On: 03/23/2018 16:44     Medications:  Scheduled: . acyclovir ointment   Topical Q4H  .  amphetamine-dextroamphetamine  5 mg Oral Daily  . aspirin  81 mg Oral Daily  . benzonatate  200 mg Oral TID  . carvedilol  3.125 mg Oral BID WC  . Chlorhexidine Gluconate Cloth  6 each Topical Daily  . chlorpheniramine-HYDROcodone  5 mL Oral Q12H  . enoxaparin (LOVENOX) injection  40 mg Subcutaneous Daily  . famotidine  20 mg Oral BID  . fluticasone  2 spray Each Nare BID  . mouth rinse  15 mL Mouth Rinse BID  . polyethylene glycol  17 g Oral Daily  . ENSURE MAX PROTEIN  11 oz Oral Daily  . senna-docusate  2 tablet Oral BID  . sodium chloride flush  10-40 mL Intracatheter Q12H    Continuous: . sodium chloride Stopped (03/20/18 1940)  . cefTRIAXone (ROCEPHIN)  IV Stopped (03/24/18 1214)   YKD:XIPJAS chloride, acetaminophen, diphenhydrAMINE, docusate, guaiFENesin-dextromethorphan, morphine injection, ondansetron **OR** ondansetron (ZOFRAN) IV, oxyCODONE-acetaminophen, sodium chloride flush    Assessment/Plan:   Acute pyelonephritis with sepsis/left ureteral stone with obstruction Patient's noted to have E. coli bacteremia.  She was seen by urology due to the obstructing stone.  She underwent retrograde pyelogram and placement of left ureteral stent.  This was done on 1/16.  Based on sensitivities patient was transitioned to ceftriaxone.  She will need a ureteroscopy in February which will be arranged by urology.  From a sepsis standpoint patient has stabilized.  Antibiotics to be given for total of 10 days.  Today is day 8.  Acute cardiomyopathy, systolic and diastolic Patient noted to have ejection fraction of 30 to 35% with grade 2 diastolic dysfunction.  Cardiology is following.  Patient with some evidence for fluid overload.  Patient has been getting diuretics daily for the past 2 days.  Cardiology to reevaluate today.  Further work-up to be considered only patient has completely recovered from her acute illness.  This will be planned in the next several weeks.  For now patient remains on carvedilol.  Currently not on ARB or ACE inhibitor.  Her weight has decreased.  Continue to monitor ins and outs and daily weights.  Hypokalemia Normal this morning.  Chest pain Most likely due to a combination of cardiac abnormalities as well as sepsis as well as fluid overload.  She also is experiencing a lot of cough which is also contributing.  Even though troponins are elevated they are mild and not thought to be truly ischemic.  Not much improvement and cough with Tessalon.  We will give her Tussionex.  Thrombocytopenia Secondary to sepsis.  Platelet counts have  improved.  Insomnia Melatonin  Constipation MiraLAX.  Cold sores Acyclovir  Abnormal LFTs Most likely due to sepsis and septic shock.  Improving.  DVT Prophylaxis: Lovenox    Code Status: Full code Family Communication: Discussed with the patient Disposition Plan: Management as outlined above.  Mobilize.  Await further cardiology input.  Hopefully discharge after her antibiotic course is completed.      LOS: 7 days   Emmanuelle Coxe [employer] on [email or web address]  03/25/2018, 11:40 AM

## 2018-03-25 NOTE — Progress Notes (Signed)
Progress Note  Patient Name: Stephanie Patrick Date of Encounter: 03/25/2018   Primary Cardiologist: Kristeen MissPhilip Jahred Tatar, MD   Subjective   33 yo with acute systolic dysfunction presumable related to sepsis syndrome She has made great progress.   Inpatient Medications    Scheduled Meds: . acyclovir ointment   Topical Q4H  . amphetamine-dextroamphetamine  5 mg Oral Daily  . aspirin  81 mg Oral Daily  . benzonatate  200 mg Oral TID  . carvedilol  3.125 mg Oral BID WC  . Chlorhexidine Gluconate Cloth  6 each Topical Daily  . chlorpheniramine-HYDROcodone  5 mL Oral Q12H  . enoxaparin (LOVENOX) injection  40 mg Subcutaneous Daily  . famotidine  20 mg Oral BID  . fluticasone  2 spray Each Nare BID  . mouth rinse  15 mL Mouth Rinse BID  . polyethylene glycol  17 g Oral Daily  . ENSURE MAX PROTEIN  11 oz Oral Daily  . senna-docusate  2 tablet Oral BID  . sodium chloride flush  10-40 mL Intracatheter Q12H   Continuous Infusions: . sodium chloride Stopped (03/20/18 1940)  . cefTRIAXone (ROCEPHIN)  IV Stopped (03/24/18 1214)   PRN Meds: sodium chloride, acetaminophen, diphenhydrAMINE, docusate, guaiFENesin-dextromethorphan, morphine injection, ondansetron **OR** ondansetron (ZOFRAN) IV, oxyCODONE-acetaminophen, sodium chloride flush   Vital Signs    Vitals:   03/24/18 1303 03/24/18 2043 03/25/18 0536 03/25/18 0926  BP: 112/78 101/62 103/63   Pulse: 89 80 82 81  Resp: (!) 23 18 18    Temp: 97.9 F (36.6 C) 97.8 F (36.6 C) 98.4 F (36.9 C)   TempSrc: Oral Oral Oral   SpO2: 92% 90% 90%   Weight:   87.5 kg   Height:        Intake/Output Summary (Last 24 hours) at 03/25/2018 0932 Last data filed at 03/25/2018 0540 Gross per 24 hour  Intake 660 ml  Output 800 ml  Net -140 ml   Last 3 Weights 03/25/2018 03/24/2018 03/23/2018  Weight (lbs) 192 lb 14.4 oz 205 lb 4 oz 216 lb 0.8 oz  Weight (kg) 87.5 kg 93.1 kg 98 kg      Telemetry     NSR - Personally Reviewed  ECG    No  new tracings- Personally Reviewed  Physical Exam  Physical Exam: Blood pressure 103/63, pulse 81, temperature 98.4 F (36.9 C), temperature source Oral, resp. rate 18, height 5\' 2"  (1.575 m), weight 87.5 kg, last menstrual period 03/01/2018, SpO2 90 %.  GEN:  Well nourished, well developed in no acute distress HEENT: Normal NECK: No JVD; No carotid bruits LYMPHATICS: No lymphadenopathy CARDIAC: RRR  RESPIRATORY:  Clear to auscultation without rales, wheezing or rhonchi  ABDOMEN: Soft, non-tender, non-distended MUSCULOSKELETAL:  No edema; No deformity  SKIN: Warm and dry NEUROLOGIC:  Alert and oriented x 3   Labs    Chemistry Recent Labs  Lab 03/19/18 0348  03/22/18 1153 03/24/18 0500 03/25/18 0650  NA 139   < > 140 139 138  K 3.8   < > 3.4* 3.0* 3.6  CL 113*   < > 105 100 102  CO2 15*   < > 27 31 26   GLUCOSE 143*   < > 153* 100* 107*  BUN 12   < > 20 9 13   CREATININE 1.07*   < > 0.73 0.71 0.75  CALCIUM 5.5*   < > 8.0* 8.0* 8.4*  PROT 4.5*  --  5.4* 5.2*  --   ALBUMIN 2.7*  --  2.5* 2.5*  --   AST 43*  --  92* 56*  --   ALT 39  --  321* 166*  --   ALKPHOS 52  --  149* 195*  --   BILITOT 1.9*  --  1.5* 1.2  --   GFRNONAA >60   < > >60 >60 >60  GFRAA >60   < > >60 >60 >60  ANIONGAP 11   < > 8 8 10    < > = values in this interval not displayed.     Hematology Recent Labs  Lab 03/22/18 1153 03/23/18 0424 03/25/18 0650  WBC 55.7* 32.5* 32.1*  RBC 3.90 3.70* 4.03  HGB 11.7* 10.8* 12.0  HCT 36.6 33.8* 37.5  MCV 93.8 91.4 93.1  MCH 30.0 29.2 29.8  MCHC 32.0 32.0 32.0  RDW 14.0 13.7 13.6  PLT 125* 148* 202    Cardiac Enzymes Recent Labs  Lab 03/23/18 0204 03/23/18 0618 03/23/18 1409  TROPONINI 0.42* 0.38* 0.30*   No results for input(s): TROPIPOC in the last 168 hours.   BNPNo results for input(s): BNP, PROBNP in the last 168 hours.   DDimer No results for input(s): DDIMER in the last 168 hours.   Radiology    Dg Chest Port 1 View  Result Date:  03/23/2018 CLINICAL DATA:  Pulmonary edema EXAM: PORTABLE CHEST 1 VIEW COMPARISON:  03/20/2018 FINDINGS: Right IJ central venous catheter tip is at the cavoatrial junction. There is shallow lung inflation with mild cardiomegaly. Mild pulmonary edema persists. Endotracheal tube and orogastric tube have been removed. IMPRESSION: Unchanged mild pulmonary edema, status post endotracheal extubation. Electronically Signed   By: Deatra RobinsonKevin  Herman M.D.   On: 03/23/2018 16:44    Cardiac Studies   Echocardiogram 03/19/2018 Study Conclusions - Left ventricle: The cavity size was normal. Wall thickness was normal. Systolic function was moderately to severely reduced. The estimated ejection fraction was in the range of 30% to 35%. Images were inadequate for LV wall motion assessment. Features are consistent with a pseudonormal left ventricular filling pattern, with concomitant abnormal relaxation and increased filling pressure (grade 2 diastolic dysfunction). - Aortic valve: There was no stenosis. - Mitral valve: There was mild regurgitation. - Right ventricle: The cavity size was normal. Systolic function was mildly reduced. - Tricuspid valve: Peak RV-RA gradient (S): 21 mm Hg. - Pulmonary arteries: PA peak pressure: 29 mm Hg (S). - Systemic veins: IVC measured 1.9 cm with < 50% respirophasic variation, suggesting RA pressure 8 mmHg.  Impressions: - Very poor quality echo even with Definity. LV EF is estimated in 30-35% range but would suggest TEE or cardiac MRI to get better view. Moderate diastolic dysfunction. Normal RV size wtih mildly decreased systolic function. Mild mitral regurgitation.  Patient Profile     33 y.o. female with a hx of ADHD, migraines, depression, recurrent UTI and vitamin D deficiencynow admitted with sepsis with lt ureteral stone with obstruction, s/p cysto with ureteral stent placement.  Intubation and hypotension, now decreased EF.        Assessment & Plan    Cardiomyopathy/acute systolic CHF -EF 40-98%30-35%, may be due to sepsis syndrome treated with massive volume and required intubation and pressors. -Patient has been diuresing.  Patient had 6.8 L urine output day before yesterday, only 800 documented yesterday.  Weight is down 12 pounds from yesterday, down 27 pounds from peak.  -Electrolytes and renal function are stable today  Will repeat echo - I suspect she has improved significantly  Chest discomfort  atypical CP ,  Associated with a cough  Elevated troponin  flat trend .   Not c/w an ACS        For questions or updates, please contact [facility] Please consult [email or web address] for contact info under        Signed, Berton Bon, NP  03/25/2018, 9:32 AM

## 2018-03-25 NOTE — Progress Notes (Signed)
  Echocardiogram 2D Echocardiogram has been performed.  Stephanie Patrick 03/25/2018, 3:49 PM

## 2018-03-26 ENCOUNTER — Telehealth: Payer: Self-pay | Admitting: Cardiovascular Disease

## 2018-03-26 LAB — CBC
HCT: 38.5 % (ref 36.0–46.0)
Hemoglobin: 12.3 g/dL (ref 12.0–15.0)
MCH: 29.5 pg (ref 26.0–34.0)
MCHC: 31.9 g/dL (ref 30.0–36.0)
MCV: 92.3 fL (ref 80.0–100.0)
Platelets: 239 10*3/uL (ref 150–400)
RBC: 4.17 MIL/uL (ref 3.87–5.11)
RDW: 14 % (ref 11.5–15.5)
WBC: 20.2 10*3/uL — ABNORMAL HIGH (ref 4.0–10.5)
nRBC: 0.3 % — ABNORMAL HIGH (ref 0.0–0.2)

## 2018-03-26 LAB — BASIC METABOLIC PANEL
Anion gap: 8 (ref 5–15)
BUN: 11 mg/dL (ref 6–20)
CO2: 26 mmol/L (ref 22–32)
Calcium: 8.7 mg/dL — ABNORMAL LOW (ref 8.9–10.3)
Chloride: 105 mmol/L (ref 98–111)
Creatinine, Ser: 0.79 mg/dL (ref 0.44–1.00)
GFR calc Af Amer: >60 mL/min (ref >60)
GFR calc non Af Amer: >60 mL/min (ref >60)
Glucose, Bld: 106 mg/dL — ABNORMAL HIGH (ref 70–99)
POTASSIUM: 4.4 mmol/L (ref 3.5–5.1)
Sodium: 139 mmol/L (ref 135–145)

## 2018-03-26 MED ORDER — DOCUSATE SODIUM 50 MG/5ML PO LIQD
100.0000 mg | Freq: Two times a day (BID) | ORAL | Status: DC | PRN
  Administered 2018-03-26: 100 mg via ORAL
  Filled 2018-03-26 (×3): qty 10

## 2018-03-26 MED ORDER — CARVEDILOL 3.125 MG PO TABS: 3.1250 mg | ORAL_TABLET | Freq: Two times a day (BID) | ORAL | Status: DC

## 2018-03-26 MED ORDER — BISACODYL 10 MG RE SUPP: 10.0000 mg | Freq: Every day | RECTAL | Status: DC | PRN

## 2018-03-26 MED ORDER — ALUM & MAG HYDROXIDE-SIMETH 200-200-20 MG/5ML PO SUSP
15.0000 mL | ORAL | Status: DC | PRN
  Administered 2018-03-26 (×2): 15 mL via ORAL
  Filled 2018-03-26 (×2): qty 30

## 2018-03-26 NOTE — Telephone Encounter (Signed)
   Primary Cardiologist: Kristeen Miss, MD  Chart reviewed as part of pre-operative protocol coverage. Patient is currently admitted to the hospital with sepsis 2/2 ureteral stone with obstruction where she was found to have a sepsis mediated cardiomyopathy with transient drop in EF to 30-35%, improved to 50-55%.   Given current admission and unclear timing of urologic procedure, will remove from the preoperative pool at this time. Discussed with Dr. Estil Daft office and they will contact us regarding preoperative assessment closer to the time of her procedure. If patient to undergo procedure while admitted to the hospital, inpatient cardiology can address her preoperative risk.   I will route this recommendation to the requesting party via Epic fax function and remove from pre-op pool.  Please call with questions.  Beatriz Stallion, PA-C 03/26/2018, 4:46 PM

## 2018-03-26 NOTE — Telephone Encounter (Signed)
° °  [facility] Pre-operative Risk Assessment    Request for surgical clearance:  1. What type of surgery is being performed? Cystoscopy Left ureturoscopy laser lithotropsy and stent exchange   2. When is this surgery scheduled? TBD  3. What type of clearance is required (medical clearance vs. Pharmacy clearance to hold med vs. Both)? Medical & pharmacy  4. Are there any medications that need to be held prior to surgery and how long?if she is placed on any medication upon discharge   5. Practice name and name of physician performing surgery? [facility] / Dr. Cristela Felt  6. What is your office phone number [phone]   7.   What is your office fax number [phone]  8.   Anesthesia type (None, local, MAC, general) ? general   Stephanie Patrick 03/26/2018, 4:21 PM  _________________________________________________________________   (provider comments below)

## 2018-03-26 NOTE — Progress Notes (Signed)
[facility] PROGRESS NOTE  Stephanie Patrick ZOX:[id number] DOB: [birth year] DOA: 03/18/2018  PCP: Sandford Craze, NP  Brief History/Interval Summary: 33 yo female with PMHx sig for vit D def, recurrent UTI presented to [facility] on 1/16 with left flank pain found to have a Left ureteral stone with obstruction > emergent stent drained pus from L kidney but pt deteriorated with gm neg septic shock.  Patient was initially admitted to the intensive care unit.  She had to be intubated for airway protection.  She was successfully extubated.  Has been stable for the past many days.  Transferred to the hospitalist service.  Reason for Visit: Acute pyelonephritis.  E. coli sepsis.  Consultants: Urology.  Critical care medicine.  Cardiology.   Hospital events   1/16 admitted with left flank pain found to have ureteral stone with obstruction underwent emergent stent, deteriorated with gram-negative septic shock and respiratory failure 1/17: Intubated for respiratory failure felt possibly pulmonary edema versus acute lung injury, profound metabolic acidosis necessitating bicarbonate drip 1/18 bicarbonate stopped, vasopressin discontinued, successfully extubated. 1/19: Stress dose steroids stopped levophed stopped, creatinine improving 1/20: All cultures showing E. coli, pansensitive.  Gentamicin discontinued. 1/21: Patient reporting chest discomfort with cough.  Twelve-lead shows low voltage question anterior changes.  Troponin elevated.  Peak 0.42.  Aspirin started, On morning rounds still complaining of chest discomfort particularly with deep breath, admitting echocardiogram reviewed demonstrates ejection fraction of 30 to 35% with moderate to severely reduced systolic function also noted grade 2 diastolic dysfunction.  Cardiology asked to evaluate for new cardiomyopathy and troponin elevation.  Lasix given per pulmonary edema, Coreg started. 03/24/18 uneventful night, Feeling better, Still has  some pain and discomfort   Procedures:  Cystoscopy with Retrograde Pyelogram/ Left Ureteral Stent Placement 03/19/2018 Endotracheally intubated 03/19/2018 Arterial line right brachial 03/19/2018 CVL R IJ  03/19/18     Transthoracic echocardiogram 1/17 Study Conclusions  - Left ventricle: The cavity size was normal. Wall thickness was   normal. Systolic function was moderately to severely reduced. The   estimated ejection fraction was in the range of 30% to 35%.   Images were inadequate for LV wall motion assessment. Features   are consistent with a pseudonormal left ventricular filling   pattern, with concomitant abnormal relaxation and increased   filling pressure (grade 2 diastolic dysfunction). - Aortic valve: There was no stenosis. - Mitral valve: There was mild regurgitation. - Right ventricle: The cavity size was normal. Systolic function   was mildly reduced. - Tricuspid valve: Peak RV-RA gradient (S): 21 mm Hg. - Pulmonary arteries: PA peak pressure: 29 mm Hg (S). - Systemic veins: IVC measured 1.9 cm with < 50% respirophasic   variation, suggesting RA pressure 8 mmHg.  Impressions:  - Very poor quality echo even with Definity. LV EF is estimated in   30-35% range but would suggest TEE or cardiac MRI to get better   view. Moderate diastolic dysfunction. Normal RV size wtih mildly   decreased systolic function. Mild mitral regurgitation.  Transthoracic echocardiogram 1/23 Study Conclusions  - Left ventricle: The cavity size was normal. Systolic function was   normal. The estimated ejection fraction was in the range of 50%   to 55%. Wall motion was normal; there were no regional wall   motion abnormalities. The study is not technically sufficient to   allow evaluation of LV diastolic function. - Pericardium, extracardiac: A trivial pericardial effusion was   identified posterior to the heart. Features were not consistent  with tamponade  physiology.  Impressions:  - Improved cardiac windows on this study. EF lower end of normal.   No significant valve disease   Antimicrobials:  Vancomycin- 03/18/2018 x 1 dose  Rocephin 03/18/2018, 117 increased to 2 g IV every 24 hours>> Gentamicin 1/17 >>> stopped 1/19   Antibiotics: Ceftriaxone  Subjective/Interval History: Patient states that she slept well overnight.  Her cough has improved.  And as a result her chest pain is also improved.  Denies any nausea vomiting.  Ambulated in the hallway.     ROS: Denies any headaches  Objective:  Vital Signs  Vitals:   03/25/18 1309 03/25/18 1755 03/25/18 2118 03/26/18 0515  BP: (!) 94/49 (!) 91/56 109/69 (!) 99/45  Pulse: 65 75 73 66  Resp:   18 18  Temp: 97.6 F (36.4 C)  97.8 F (36.6 C) 98.4 F (36.9 C)  TempSrc: Oral  Oral Oral  SpO2: 90% 92% 93% 93%  Weight:    87.1 kg  Height:        Intake/Output Summary (Last 24 hours) at 03/26/2018 1131 Last data filed at 03/25/2018 1300 Gross per 24 hour  Intake 240 ml  Output -  Net 240 ml   Filed Weights   03/24/18 0500 03/25/18 0536 03/26/18 0515  Weight: 93.1 kg 87.5 kg 87.1 kg   General appearance: Awake alert.  In no distress Resp: Normal effort at rest.  Much improved air entry.  Actually clear to auscultation bilaterally.   Cardio: S1-S2 is normal regular.  No S3-S4.  No rubs murmurs or bruit.  Sinus rhythm on telemetry. GI: Abdomen is soft.  Nontender nondistended.  Bowel sounds are present normal.  No masses organomegaly Extremities: No edema.  Full range of motion of lower extremities. Neurologic: Alert and oriented x3.  No focal neurological deficits.   Lab Results:  Data Reviewed: I have personally reviewed following labs and imaging studies  CBC: Recent Labs  Lab 03/20/18 0500 03/21/18 0300 03/22/18 1153 03/23/18 0424 03/25/18 0650 03/26/18 0550  WBC 39.1* 46.7* 55.7* 32.5* 32.1* 20.2*  NEUTROABS 29.8*  --  46.5* 22.7*  --   --   HGB 10.8*  10.1* 11.7* 10.8* 12.0 12.3  HCT 33.7* 31.7* 36.6 33.8* 37.5 38.5  MCV 92.3 94.6 93.8 91.4 93.1 92.3  PLT 64* 77* 125* 148* 202 239    Basic Metabolic Panel: Recent Labs  Lab 03/19/18 1620  03/20/18 0500 03/20/18 1709 03/21/18 0300 03/22/18 1153 03/24/18 0500 03/25/18 0650 03/26/18 0550  NA  --    < > 145  --  142 140 139 138 139  K  --    < > 4.5  --  4.1 3.4* 3.0* 3.6 4.4  CL  --    < > 108  --  108 105 100 102 105  CO2  --    < > 24  --  26 27 31 26 26   GLUCOSE  --    < > 153*  --  111* 153* 100* 107* 106*  BUN  --    < > 19  --  21* 20 9 13 11   CREATININE  --    < > 1.07*  --  0.80 0.73 0.71 0.75 0.79  CALCIUM  --    < > 7.2*  --  7.6* 8.0* 8.0* 8.4* 8.7*  MG 1.4*  --  2.0 2.1 2.3  --   --  2.0  --   PHOS 3.5  --  2.5  1.9* 2.1*  --   --   --   --    < > = values in this interval not displayed.    GFR: Estimated Creatinine Clearance: 103.4 mL/min (by C-G formula based on SCr of 0.79 mg/dL).  Liver Function Tests: Recent Labs  Lab 03/22/18 1153 03/24/18 0500  AST 92* 56*  ALT 321* 166*  ALKPHOS 149* 195*  BILITOT 1.5* 1.2  PROT 5.4* 5.2*  ALBUMIN 2.5* 2.5*    Cardiac Enzymes: Recent Labs  Lab 03/23/18 0204 03/23/18 0618 03/23/18 1409  TROPONINI 0.42* 0.38* 0.30*    CBG: Recent Labs  Lab 03/23/18 1634 03/23/18 1925 03/23/18 2327 03/24/18 0339 03/24/18 0804  GLUCAP 88 95 96 110* 91     Recent Results (from the past 240 hour(s))  Urine culture     Status: Abnormal   Collection Time: 03/18/18  1:45 PM  Result Value Ref Range Status   Specimen Description   Final    URINE, RANDOM Performed at [facility], [address], [place], [place] [address]    Special Requests   Final    NONE Performed at [facility], [address] [place], [place] [address]    Culture 80,000 COLONIES/mL ESCHERICHIA COLI (A)  Final   Report Status 03/21/2018 FINAL  Final   Organism ID, Bacteria ESCHERICHIA COLI (A)  Final       Susceptibility   Escherichia coli - MIC*    AMPICILLIN 8 SENSITIVE Sensitive     CEFAZOLIN <=4 SENSITIVE Sensitive     CEFTRIAXONE <=1 SENSITIVE Sensitive     CIPROFLOXACIN <=0.25 SENSITIVE Sensitive     GENTAMICIN <=1 SENSITIVE Sensitive     IMIPENEM <=0.25 SENSITIVE Sensitive     NITROFURANTOIN <=16 SENSITIVE Sensitive     TRIMETH/SULFA <=20 SENSITIVE Sensitive     AMPICILLIN/SULBACTAM 4 SENSITIVE Sensitive     PIP/TAZO <=4 SENSITIVE Sensitive     Extended ESBL NEGATIVE Sensitive     * 80,000 COLONIES/mL ESCHERICHIA COLI  Wet prep, genital     Status: Abnormal   Collection Time: 03/18/18  3:36 PM  Result Value Ref Range Status   Yeast Wet Prep HPF POC NONE SEEN NONE SEEN Final   Trich, Wet Prep NONE SEEN NONE SEEN Final   Clue Cells Wet Prep HPF POC NONE SEEN NONE SEEN Final   WBC, Wet Prep HPF POC RARE (A) NONE SEEN Final    Comment: Specimen diluted due to transport tube containing more than 1 ml of saline, interpret results with caution.   Sperm NONE SEEN  Final    Comment: Performed at [facility], [address] [place], [place] [address]  Blood Culture (routine x 2)     Status: Abnormal   Collection Time: 03/18/18  7:13 PM  Result Value Ref Range Status   Specimen Description   Final    BLOOD RIGHT FOREARM Performed at [facility], [address] [place], [place] [address]    Special Requests   Final    BOTTLES DRAWN AEROBIC AND ANAEROBIC Blood Culture adequate volume Performed at [facility], [address], [place], [place] [address]    Culture  Setup Time   Final    GRAM NEGATIVE RODS IN BOTH AEROBIC AND ANAEROBIC BOTTLES CRITICAL RESULT CALLED TO, READ BACK BY AND VERIFIED WITH: Azzie Glatter PharmD 9:50 03/19/18 ([email or web address]) Performed at [facility], [address], [place], [place] [address]  Culture ESCHERICHIA COLI (A)  Final   Report Status 03/21/2018 FINAL  Final   Organism ID, Bacteria ESCHERICHIA COLI   Final      Susceptibility   Escherichia coli - MIC*    AMPICILLIN 4 SENSITIVE Sensitive     CEFAZOLIN <=4 SENSITIVE Sensitive     CEFEPIME <=1 SENSITIVE Sensitive     CEFTAZIDIME <=1 SENSITIVE Sensitive     CEFTRIAXONE <=1 SENSITIVE Sensitive     CIPROFLOXACIN <=0.25 SENSITIVE Sensitive     GENTAMICIN <=1 SENSITIVE Sensitive     IMIPENEM <=0.25 SENSITIVE Sensitive     TRIMETH/SULFA <=20 SENSITIVE Sensitive     AMPICILLIN/SULBACTAM <=2 SENSITIVE Sensitive     PIP/TAZO <=4 SENSITIVE Sensitive     Extended ESBL NEGATIVE Sensitive     * ESCHERICHIA COLI  Blood Culture (routine x 2)     Status: Abnormal   Collection Time: 03/18/18  7:13 PM  Result Value Ref Range Status   Specimen Description BLOOD LEFT HAND  Final   Special Requests   Final    BOTTLES DRAWN AEROBIC AND ANAEROBIC Blood Culture results may not be optimal due to an inadequate volume of blood received in culture bottles   Culture  Setup Time   Final    GRAM NEGATIVE RODS IN BOTH AEROBIC AND ANAEROBIC BOTTLES CRITICAL VALUE NOTED.  VALUE IS CONSISTENT WITH PREVIOUSLY REPORTED AND CALLED VALUE.    Culture (A)  Final    ESCHERICHIA COLI SUSCEPTIBILITIES PERFORMED ON PREVIOUS CULTURE WITHIN THE LAST 5 DAYS. Performed at [facility], [address], [place], [place] [address]    Report Status 03/21/2018 FINAL  Final  Blood Culture ID Panel (Reflexed)     Status: Abnormal   Collection Time: 03/18/18  7:13 PM  Result Value Ref Range Status   Enterococcus species NOT DETECTED NOT DETECTED Final   Listeria monocytogenes NOT DETECTED NOT DETECTED Final   Staphylococcus species NOT DETECTED NOT DETECTED Final   Staphylococcus aureus (BCID) NOT DETECTED NOT DETECTED Final   Streptococcus species NOT DETECTED NOT DETECTED Final   Streptococcus agalactiae NOT DETECTED NOT DETECTED Final   Streptococcus pneumoniae NOT DETECTED NOT DETECTED Final   Streptococcus pyogenes NOT DETECTED NOT DETECTED Final   Acinetobacter  baumannii NOT DETECTED NOT DETECTED Final   Enterobacteriaceae species DETECTED (A) NOT DETECTED Final    Comment: Enterobacteriaceae represent a large family of gram-negative bacteria, not a single organism. CRITICAL RESULT CALLED TO, READ BACK BY AND VERIFIED WITH: Azzie Glatter PharmD 9:50 03/19/18 ([email or web address])    Enterobacter cloacae complex NOT DETECTED NOT DETECTED Final   Escherichia coli DETECTED (A) NOT DETECTED Final    Comment: CRITICAL RESULT CALLED TO, READ BACK BY AND VERIFIED WITH: Azzie Glatter PharmD 9:50 03/19/18 ([email or web address])    Klebsiella oxytoca NOT DETECTED NOT DETECTED Final   Klebsiella pneumoniae NOT DETECTED NOT DETECTED Final   Proteus species NOT DETECTED NOT DETECTED Final   Serratia marcescens NOT DETECTED NOT DETECTED Final   Carbapenem resistance NOT DETECTED NOT DETECTED Final   Haemophilus influenzae NOT DETECTED NOT DETECTED Final   Neisseria meningitidis NOT DETECTED NOT DETECTED Final   Pseudomonas aeruginosa NOT DETECTED NOT DETECTED Final   Candida albicans NOT DETECTED NOT DETECTED Final   Candida glabrata NOT DETECTED NOT DETECTED Final   Candida krusei NOT DETECTED NOT DETECTED Final   Candida parapsilosis NOT DETECTED NOT DETECTED Final   Candida tropicalis NOT DETECTED NOT DETECTED Final    Comment: Performed at [facility], [address] [place], [place] [address]  Surgical pcr screen     Status: Abnormal   Collection Time: 03/18/18 10:51 PM  Result Value Ref Range Status   MRSA, PCR POSITIVE (A) NEGATIVE Final    Comment: RESULT CALLED TO, READ BACK BY AND VERIFIED WITH: STEWART,S RN @0045  ON 03/19/2018 JACKSON,K    Staphylococcus aureus POSITIVE (A) NEGATIVE Final    Comment: (NOTE) The Xpert SA Assay (FDA approved for NASAL specimens in patients 33 years of age and older), is one component of a comprehensive surveillance program. It is not intended to diagnose infection nor to guide or monitor treatment. Performed at [facility], [address] [place], [place] [address]   Urine culture     Status: None   Collection Time: 03/19/18  6:44 AM  Result Value Ref Range Status   Specimen Description   Final    URINE, CLEAN CATCH Performed at [facility], [address] [place], [place] [address]    Special Requests   Final    NONE Performed at [facility], [address] [place], [place] [address]    Culture   Final    NO GROWTH Performed at [facility], [address] [place], [place] [address]    Report Status 03/20/2018 FINAL  Final      Radiology Studies: No results found.   Medications:  Scheduled: . acyclovir ointment   Topical Q4H  . amphetamine-dextroamphetamine  5 mg Oral Daily  . aspirin  81 mg Oral Daily  . benzonatate  200 mg Oral TID  . [START ON 03/27/2018] carvedilol  3.125 mg Oral BID WC  . Chlorhexidine Gluconate Cloth  6 each Topical Daily  . chlorpheniramine-HYDROcodone  5 mL Oral Q12H  . enoxaparin (LOVENOX) injection  40 mg Subcutaneous Daily  . fluticasone  2 spray Each Nare BID  . LORazepam  1 mg Oral QHS  . mouth rinse  15 mL Mouth Rinse BID  . polyethylene glycol  17 g Oral Daily  . ENSURE MAX PROTEIN  11 oz Oral Daily  . senna-docusate  2 tablet Oral BID  . sodium chloride flush  10-40 mL Intracatheter Q12H   Continuous: . sodium chloride 250 mL (03/25/18 1212)  . cefTRIAXone (ROCEPHIN)  IV 2 g (03/25/18 1213)   ONG:EXBMWUPRN:sodium chloride, acetaminophen, bisacodyl, diphenhydrAMINE, docusate, guaiFENesin-dextromethorphan, morphine injection, ondansetron **OR** ondansetron (ZOFRAN) IV, oxyCODONE-acetaminophen, sodium chloride flush    Assessment/Plan:   Acute pyelonephritis with sepsis/left ureteral stone with obstruction Patient's noted to have E. coli bacteremia.  She was seen by urology due to the obstructing stone.  She underwent retrograde pyelogram and placement of left ureteral stent.  This was done on 1/16.   Based on sensitivities patient was transitioned to ceftriaxone.  She will need a ureteroscopy in February which will be arranged by urology.  From a sepsis standpoint patient has stabilized.  Antibiotics to be given for total of 10 days.  Today is day 9.  Anticipate last dose tomorrow following which she can be discharged.  Her WBC continues to improve.  Acute cardiomyopathy, systolic and diastolic Patient noted to have ejection fraction of 30 to 35% with grade 2 diastolic dysfunction.  This was to be secondary to her acute illness.  Patient was given diuretics with brisk diuresis.  Weight decreased.  Patient was started on carvedilol.  Patient underwent repeat echocardiogram yesterday which shows improvement in the systolic function.  Due  to borderline low blood pressure is we have held her beta-blocker which she may not need going forward.  Does not need ACE inhibitor or ARB considering improvement in her systolic function.  Await final recommendations from cardiology.   Hypokalemia Resolved.  Chest pain Initially thought to be due to cardiac abnormalities as well as sepsis.  Subsequently was felt that her symptoms are primarily due to cough.  Since her cough is improved her pain has improved as well.  Troponin elevations were mild.  No concern for ischemia at this time.  Cough is improved with Tussionex which will be continued for now.    Thrombocytopenia Secondary to sepsis.  Platelet counts have improved.  Insomnia Melatonin  Constipation MiraLAX.  Cold sores Acyclovir  Abnormal LFTs Most likely due to sepsis and septic shock.  Improving.  DVT Prophylaxis: Lovenox    Code Status: Full code Family Communication: Discussed with the patient Disposition Plan: Management as outlined above.  Continue to mobilize.  Await final cardiology input.  Urology will see patient and schedule the ureteroscopy as an outpatient.  Anticipate discharge tomorrow after her last dose of ceftriaxone.       LOS: 8 days   Rafael Quesada [employer] on [email or web address]  03/26/2018, 11:31 AM

## 2018-03-26 NOTE — Care Management Note (Signed)
Case Management Note  Patient Details  Name: Stephanie Patrick MRN: [id number] Date of Birth: [birth year]  Subjective/Objective:Sepsis. From home. CM cons-meds asst, insurance will start 1/26. Spoke to patient in rm about resources-informed of MATCH Program & requirements-patient voiced understanding, also informed of good rx(she has it on her phone), informed of coupons we have available depending on the med-patient voiced understanding. Provided w/pcp listing, & other resources. Patient appreciated the info & resources.                    Action/Plan:dc home   Expected Discharge Date:  (unknown)               Expected Discharge Plan:  Home/Self Care  In-House Referral:     Discharge planning Services  CM Consult, [facility], [facility] Program  Post Acute Care Choice:    Choice offered to:     DME Arranged:    DME Agency:     HH Arranged:    HH Agency:     Status of Service:  In process, will continue to follow  If discussed at Long Length of Stay Meetings, dates discussed:    Additional Comments:  Lanier Clam, RN 03/26/2018, 3:07 PM

## 2018-03-26 NOTE — Progress Notes (Signed)
   03/25/18 1755  Vitals  BP (!) 91/56  MAP (mmHg) 67  BP Method Automatic  Pulse Rate 75  Oxygen Therapy  SpO2 92 %  O2 Device Room Air   Dr. Rito Ehrlich notified of above vital signs.  Dr. Rito Ehrlich gave order to hold the 1800 dose of Coreg.  Will continue to monitor.

## 2018-03-26 NOTE — Progress Notes (Signed)
Progress Note  Patient Name: Stephanie Patrick Date of Encounter: 03/26/2018   Primary Cardiologist: Kristeen Miss, MD   Subjective   33 yo with acute systolic dysfunction presumable related to sepsis syndrome She has made great progress.   She has a ureteral stent.    Is on ABx and has improved Repeat echo showed normal LV function of 55%. She has no dyspnea    Inpatient Medications    Scheduled Meds: . acyclovir ointment   Topical Q4H  . amphetamine-dextroamphetamine  5 mg Oral Daily  . aspirin  81 mg Oral Daily  . benzonatate  200 mg Oral TID  . [START ON 03/27/2018] carvedilol  3.125 mg Oral BID WC  . Chlorhexidine Gluconate Cloth  6 each Topical Daily  . chlorpheniramine-HYDROcodone  5 mL Oral Q12H  . enoxaparin (LOVENOX) injection  40 mg Subcutaneous Daily  . fluticasone  2 spray Each Nare BID  . LORazepam  1 mg Oral QHS  . mouth rinse  15 mL Mouth Rinse BID  . polyethylene glycol  17 g Oral Daily  . ENSURE MAX PROTEIN  11 oz Oral Daily  . senna-docusate  2 tablet Oral BID  . sodium chloride flush  10-40 mL Intracatheter Q12H   Continuous Infusions: . sodium chloride 250 mL (03/25/18 1212)  . cefTRIAXone (ROCEPHIN)  IV 2 g (03/25/18 1213)   PRN Meds: sodium chloride, acetaminophen, bisacodyl, diphenhydrAMINE, docusate, guaiFENesin-dextromethorphan, morphine injection, ondansetron **OR** ondansetron (ZOFRAN) IV, oxyCODONE-acetaminophen, sodium chloride flush   Vital Signs    Vitals:   03/25/18 1309 03/25/18 1755 03/25/18 2118 03/26/18 0515  BP: (!) 94/49 (!) 91/56 109/69 (!) 99/45  Pulse: 65 75 73 66  Resp:   18 18  Temp: 97.6 F (36.4 C)  97.8 F (36.6 C) 98.4 F (36.9 C)  TempSrc: Oral  Oral Oral  SpO2: 90% 92% 93% 93%  Weight:    87.1 kg  Height:        Intake/Output Summary (Last 24 hours) at 03/26/2018 1149 Last data filed at 03/25/2018 1300 Gross per 24 hour  Intake 240 ml  Output -  Net 240 ml   Last 3 Weights 03/26/2018 03/25/2018  03/24/2018  Weight (lbs) 192 lb 192 lb 14.4 oz 205 lb 4 oz  Weight (kg) 87.091 kg 87.5 kg 93.1 kg      Telemetry    NSR - Personally Reviewed  ECG    No new tracings- Personally Reviewed  Physical Exam   Physical Exam: Blood pressure (!) 99/45, pulse 66, temperature 98.4 F (36.9 C), temperature source Oral, resp. rate 18, height 5\' 2"  (1.575 m), weight 87.1 kg, last menstrual period 03/01/2018, SpO2 93 %.  GEN:  Well nourished, well developed in no acute distress HEENT: Normal NECK: No JVD; No carotid bruits LYMPHATICS: No lymphadenopathy CARDIAC: RRR  RESPIRATORY:  Clear to auscultation without rales, wheezing or rhonchi  ABDOMEN: Soft, non-tender, non-distended MUSCULOSKELETAL:  No edema; No deformity  SKIN: Warm and dry NEUROLOGIC:  Alert and oriented x 3   Labs    Chemistry Recent Labs  Lab 03/22/18 1153 03/24/18 0500 03/25/18 0650 03/26/18 0550  NA 140 139 138 139  K 3.4* 3.0* 3.6 4.4  CL 105 100 102 105  CO2 27 31 26 26   GLUCOSE 153* 100* 107* 106*  BUN 20 9 13 11   CREATININE 0.73 0.71 0.75 0.79  CALCIUM 8.0* 8.0* 8.4* 8.7*  PROT 5.4* 5.2*  --   --   ALBUMIN 2.5* 2.5*  --   --  AST 92* 56*  --   --   ALT 321* 166*  --   --   ALKPHOS 149* 195*  --   --   BILITOT 1.5* 1.2  --   --   GFRNONAA >60 >60 >60 >60  GFRAA >60 >60 >60 >60  ANIONGAP 8 8 10 8      Hematology Recent Labs  Lab 03/23/18 0424 03/25/18 0650 03/26/18 0550  WBC 32.5* 32.1* 20.2*  RBC 3.70* 4.03 4.17  HGB 10.8* 12.0 12.3  HCT 33.8* 37.5 38.5  MCV 91.4 93.1 92.3  MCH 29.2 29.8 29.5  MCHC 32.0 32.0 31.9  RDW 13.7 13.6 14.0  PLT 148* 202 239    Cardiac Enzymes Recent Labs  Lab 03/23/18 0204 03/23/18 0618 03/23/18 1409  TROPONINI 0.42* 0.38* 0.30*   No results for input(s): TROPIPOC in the last 168 hours.   BNPNo results for input(s): BNP, PROBNP in the last 168 hours.   DDimer No results for input(s): DDIMER in the last 168 hours.   Radiology    No results  found.  Cardiac Studies   Echocardiogram 03/19/2018 Study Conclusions - Left ventricle: The cavity size was normal. Wall thickness was normal. Systolic function was moderately to severely reduced. The estimated ejection fraction was in the range of 30% to 35%. Images were inadequate for LV wall motion assessment. Features are consistent with a pseudonormal left ventricular filling pattern, with concomitant abnormal relaxation and increased filling pressure (grade 2 diastolic dysfunction). - Aortic valve: There was no stenosis. - Mitral valve: There was mild regurgitation. - Right ventricle: The cavity size was normal. Systolic function was mildly reduced. - Tricuspid valve: Peak RV-RA gradient (S): 21 mm Hg. - Pulmonary arteries: PA peak pressure: 29 mm Hg (S). - Systemic veins: IVC measured 1.9 cm with < 50% respirophasic variation, suggesting RA pressure 8 mmHg.  Impressions: - Very poor quality echo even with Definity. LV EF is estimated in 30-35% range but would suggest TEE or cardiac MRI to get better view. Moderate diastolic dysfunction. Normal RV size wtih mildly decreased systolic function. Mild mitral regurgitation.  Patient Profile     33 y.o. female with a hx of ADHD, migraines, depression, recurrent UTI and vitamin D deficiencynow admitted with sepsis with lt ureteral stone with obstruction, s/p cysto with ureteral stent placement.  Intubation and hypotension, now decreased EF.       Assessment & Plan    Cardiomyopathy/acute systolic CHF -EF 10-96%30-35%, may be due to sepsis syndrome treated with massive volume and required intubation and pressors.  Repeat limited echo reveals that her left ventricular systolic function has normalized.  Her ejection fraction is now 55%. She is been on low-dose carvedilol 3.125 mg twice a day.  I would continue carvedilol at least another couple of months. Here in the office for follow-up.  We can discontinue the  carvedilol at that time.   Chest discomfort  atypical CP ,  Associated with a cough  Elevated troponin  flat trend .   Not c/w an ACS      [facility] will sign off.   Medication Recommendations:  Continue coreg 3.125 mg BID for the next 3 months  Other recommendations (labs, testing, etc):   Follow up as an outpatient:  With me or my APP in 3-4 weeks     For questions or updates, please contact [facility] Please consult [email or web address] for contact info under        Kristeen MissPhilip , MD  03/26/2018  11:52 AM    [facility] [address] [place],  [address] [place], [place]  [address] Pager [phone] Phone: [phone]; Fax: [phone]

## 2018-03-27 MED ORDER — HYDROCOD POLST-CPM POLST ER 10-8 MG/5ML PO SUER: 5.0000 mL | Freq: Two times a day (BID) | ORAL | 0 refills | Status: DC | PRN

## 2018-03-27 MED ORDER — OXYCODONE-ACETAMINOPHEN 5-325 MG PO TABS: 1.0000 | ORAL_TABLET | Freq: Four times a day (QID) | ORAL | 0 refills | Status: DC | PRN

## 2018-03-27 MED ORDER — POLYETHYLENE GLYCOL 3350 17 G PO PACK: 17.0000 g | PACK | Freq: Every day | ORAL | 0 refills | Status: DC

## 2018-03-27 MED ORDER — ONDANSETRON HCL 4 MG PO TABS: 4.0000 mg | ORAL_TABLET | Freq: Three times a day (TID) | ORAL | 0 refills | Status: DC | PRN

## 2018-03-27 MED ORDER — ACYCLOVIR 5 % EX OINT: TOPICAL_OINTMENT | CUTANEOUS | Status: DC

## 2018-03-27 MED ORDER — SENNOSIDES-DOCUSATE SODIUM 8.6-50 MG PO TABS: 2.0000 | ORAL_TABLET | Freq: Two times a day (BID) | ORAL | Status: DC

## 2018-03-27 MED ORDER — LORAZEPAM 1 MG PO TABS: 1.0000 mg | ORAL_TABLET | Freq: Two times a day (BID) | ORAL | 0 refills | Status: DC | PRN

## 2018-03-27 NOTE — Telephone Encounter (Signed)
Sherlyne has improved significantly over her hospitalization Her LV function has normalized  She is at low risk for her upcoming ureteral stent removal surgery     Kristeen Miss, MD  03/27/2018 7:01 AM    [facility] [address] [place],  [address] [place], [place]  [address] Pager [phone] Phone: [phone]; Fax: [phone]

## 2018-03-27 NOTE — Progress Notes (Signed)
    Stephanie Patrick has improved significantly from her acute systolic dysfunction. This appears to have related to her acute urosepsis. Her LV function has normalized.  She is at low risk for her upcoming removal of her ureteral stent.    Kristeen Miss, MD  03/27/2018 6:56 AM    [facility] [address] [place],  [address] [place], [place]  [address] Pager [phone] Phone: [phone]; Fax: [phone]

## 2018-03-27 NOTE — Discharge Summary (Signed)
[facility]  Physician Discharge Summary   Patient ID: Iveth Heidemann MRN: [id number] DOB/AGE: 33-19-1987 33 y.o.  Admit date: 03/18/2018 Discharge date: 03/27/2018  PCP: Sandford Craze, NP  DISCHARGE DIAGNOSES:  E. coli bacteremia with sepsis Acute pyelonephritis Left ureteral stone with obstruction status post stent Acute cardiomyopathy, resolved   RECOMMENDATIONS FOR OUTPATIENT FOLLOW UP: 1. Patient instructed to follow-up with her PCP and she will need blood work at the follow-up. 2. Urology will arrange outpatient follow-up 3. Follow-up with cardiology in 2 to 3 weeks.   DISCHARGE CONDITION: fair  Diet recommendation: Regular as tolerated  Filed Weights   03/25/18 0536 03/26/18 0515 03/27/18 0712  Weight: 87.5 kg 87.1 kg 84 kg    INITIAL HISTORY: 33 yo female with PMHx sig for vit D def, recurrent UTI presented to [facility] on 1/16 with left flank pain found to have a Left ureteral stone with obstruction > emergent stent drained pus from L kidney but pt deteriorated with gm neg septic shock.  Patient was initially admitted to the intensive care unit.  She had to be intubated for airway protection.  She was successfully extubated.  Has been stable for the past many days.  Transferred to the hospitalist service.  Reason for Visit: Acute pyelonephritis.  E. coli sepsis.  Consultants: Urology.  Critical care medicine.  Cardiology.   Hospital events   1/16 admitted with left flank pain found to have ureteral stone with obstruction underwent emergent stent, deteriorated with gram-negative septic shock and respiratory failure 1/17: Intubated for respiratory failure felt possibly pulmonary edema versus acute lung injury, profound metabolic acidosis necessitating bicarbonate drip 1/18 bicarbonate stopped, vasopressin discontinued, successfully extubated. 1/19: Stress dose steroids stopped levophed stopped, creatinine improving 1/20: All cultures showing E.  coli, pansensitive. Gentamicin discontinued. 1/21: Patient reporting chest discomfort with cough. Twelve-lead shows low voltage question anterior changes. Troponin elevated. Peak 0.42. Aspirin started, On morning rounds still complaining of chest discomfort particularly with deep breath, admitting echocardiogram reviewed demonstrates ejection fraction of 30 to 35% with moderate to severely reduced systolic function also noted grade 2 diastolic dysfunction. Cardiology asked to evaluate for new cardiomyopathy and troponin elevation. Lasix given per pulmonary edema, Coreg started. 1/22/20uneventful night,Feeling better,Still has some pain and discomfort   Procedures:  Cystoscopy with Retrograde Pyelogram/ Left Ureteral Stent Placement 03/19/2018 Endotracheally intubated 03/19/2018 Arterial line right brachial 03/19/2018 CVL R IJ 03/19/18   Transthoracic echocardiogram 1/17 Study Conclusions  - Left ventricle: The cavity size was normal. Wall thickness was normal. Systolic function was moderately to severely reduced. The estimated ejection fraction was in the range of 30% to 35%. Images were inadequate for LV wall motion assessment. Features are consistent with a pseudonormal left ventricular filling pattern, with concomitant abnormal relaxation and increased filling pressure (grade 2 diastolic dysfunction). - Aortic valve: There was no stenosis. - Mitral valve: There was mild regurgitation. - Right ventricle: The cavity size was normal. Systolic function was mildly reduced. - Tricuspid valve: Peak RV-RA gradient (S): 21 mm Hg. - Pulmonary arteries: PA peak pressure: 29 mm Hg (S). - Systemic veins: IVC measured 1.9 cm with < 50% respirophasic variation, suggesting RA pressure 8 mmHg.  Impressions:  - Very poor quality echo even with Definity. LV EF is estimated in 30-35% range but would suggest TEE or cardiac MRI to get better view. Moderate diastolic  dysfunction. Normal RV size wtih mildly decreased systolic function. Mild mitral regurgitation.  Transthoracic echocardiogram 1/23 Study Conclusions  - Left ventricle: The cavity size  was normal. Systolic function was normal. The estimated ejection fraction was in the range of 50% to 55%. Wall motion was normal; there were no regional wall motion abnormalities. The study is not technically sufficient to allow evaluation of LV diastolic function. - Pericardium, extracardiac: A trivial pericardial effusion was identified posterior to the heart. Features were not consistent with tamponade physiology.  Impressions:  - Improved cardiac windows on this study. EF lower end of normal. No significant valve disease   Antimicrobials:  Vancomycin- 03/18/2018 x 1 dose  Rocephin 03/18/2018, 117 increased to 2 g IV every 24 hours>> Gentamicin 1/17 >>> stopped 1/19   HOSPITAL COURSE:   Acute pyelonephritis with sepsis/left ureteral stone with obstruction Patient's noted to have E. coli bacteremia.  She was seen by urology due to the obstructing stone.  She underwent retrograde pyelogram and placement of left ureteral stent.  This was done on 1/16.  Based on sensitivities patient was transitioned to ceftriaxone.  She will need a ureteroscopy in February which will be arranged by urology.  From a sepsis standpoint patient has stabilized.  She has completed her 10-day course of antibiotics today.  WBC had continued to improve.  She remains afebrile.  Dysuria has improved.  Acute cardiomyopathy, systolic and diastolic Patient initially noted to have ejection fraction of 30 to 35% with grade 2 diastolic dysfunction.  This was thought to be secondary to her acute illness.  Patient was given diuretics with brisk diuresis.  Weight decreased.  Patient was started on carvedilol.  Patient underwent repeat echocardiogram which shows improvement in the systolic function with a EF of 50 to  55%.  Patient's blood pressures have been borderline low.  Few days ago when she was given carvedilol her blood pressure decreased further and she became symptomatic.  She has not been able to tolerate the beta-blocker.  Since her ejection fraction has improved and considering the risks of significant drop in blood pressure we will not continue carvedilol for now. Does not need ACE inhibitor or ARB considering improvement in her systolic function.   Hypokalemia Resolved.  Chest pain Initially thought to be due to cardiac abnormalities as well as sepsis.  Subsequently was felt that her symptoms are primarily due to cough.  Since her cough is improved her pain has improved as well.  Troponin elevations were mild.  No concern for ischemia at this time.  Cough is improved with Tussionex which will be continued for now.    Thrombocytopenia Secondary to sepsis.  Platelet counts have improved.  Insomnia Melatonin  Constipation MiraLAX.  Cold sores Acyclovir  Abnormal LFTs Most likely due to sepsis and septic shock.  Improving.  Overall stable.  Okay for discharge home today.   PERTINENT LABS:  The results of significant diagnostics from this hospitalization (including imaging, microbiology, ancillary and laboratory) are listed below for reference.    Microbiology: Recent Results (from the past 240 hour(s))  Urine culture     Status: Abnormal   Collection Time: 03/18/18  1:45 PM  Result Value Ref Range Status   Specimen Description   Final    URINE, RANDOM Performed at [facility], [address] [place], [place] [address]    Special Requests   Final    NONE Performed at [facility], [address] [place], [place] [address]    Culture 80,000 COLONIES/mL ESCHERICHIA COLI (A)  Final   Report Status 03/21/2018 FINAL  Final   Organism ID, Bacteria ESCHERICHIA COLI (  A)  Final      Susceptibility   Escherichia coli - MIC*     AMPICILLIN 8 SENSITIVE Sensitive     CEFAZOLIN <=4 SENSITIVE Sensitive     CEFTRIAXONE <=1 SENSITIVE Sensitive     CIPROFLOXACIN <=0.25 SENSITIVE Sensitive     GENTAMICIN <=1 SENSITIVE Sensitive     IMIPENEM <=0.25 SENSITIVE Sensitive     NITROFURANTOIN <=16 SENSITIVE Sensitive     TRIMETH/SULFA <=20 SENSITIVE Sensitive     AMPICILLIN/SULBACTAM 4 SENSITIVE Sensitive     PIP/TAZO <=4 SENSITIVE Sensitive     Extended ESBL NEGATIVE Sensitive     * 80,000 COLONIES/mL ESCHERICHIA COLI  Wet prep, genital     Status: Abnormal   Collection Time: 03/18/18  3:36 PM  Result Value Ref Range Status   Yeast Wet Prep HPF POC NONE SEEN NONE SEEN Final   Trich, Wet Prep NONE SEEN NONE SEEN Final   Clue Cells Wet Prep HPF POC NONE SEEN NONE SEEN Final   WBC, Wet Prep HPF POC RARE (A) NONE SEEN Final    Comment: Specimen diluted due to transport tube containing more than 1 ml of saline, interpret results with caution.   Sperm NONE SEEN  Final    Comment: Performed at [facility], [address] [place], [place] [address]  Blood Culture (routine x 2)     Status: Abnormal   Collection Time: 03/18/18  7:13 PM  Result Value Ref Range Status   Specimen Description   Final    BLOOD RIGHT FOREARM Performed at [facility], [address] [place], [place] [address]    Special Requests   Final    BOTTLES DRAWN AEROBIC AND ANAEROBIC Blood Culture adequate volume Performed at [facility], [address] [place], [place] [address]    Culture  Setup Time   Final    GRAM NEGATIVE RODS IN BOTH AEROBIC AND ANAEROBIC BOTTLES CRITICAL RESULT CALLED TO, READ BACK BY AND VERIFIED WITH: Azzie Glatter PharmD 9:50 03/19/18 ([email or web address]) Performed at [facility], [address] [place], [place] [address]    Culture ESCHERICHIA COLI (A)  Final   Report Status 03/21/2018 FINAL  Final   Organism ID, Bacteria ESCHERICHIA COLI  Final      Susceptibility   Escherichia coli -  MIC*    AMPICILLIN 4 SENSITIVE Sensitive     CEFAZOLIN <=4 SENSITIVE Sensitive     CEFEPIME <=1 SENSITIVE Sensitive     CEFTAZIDIME <=1 SENSITIVE Sensitive     CEFTRIAXONE <=1 SENSITIVE Sensitive     CIPROFLOXACIN <=0.25 SENSITIVE Sensitive     GENTAMICIN <=1 SENSITIVE Sensitive     IMIPENEM <=0.25 SENSITIVE Sensitive     TRIMETH/SULFA <=20 SENSITIVE Sensitive     AMPICILLIN/SULBACTAM <=2 SENSITIVE Sensitive     PIP/TAZO <=4 SENSITIVE Sensitive     Extended ESBL NEGATIVE Sensitive     * ESCHERICHIA COLI  Blood Culture (routine x 2)     Status: Abnormal   Collection Time: 03/18/18  7:13 PM  Result Value Ref Range Status   Specimen Description BLOOD LEFT HAND  Final   Special Requests   Final    BOTTLES DRAWN AEROBIC AND ANAEROBIC Blood Culture results may not be optimal due to an inadequate volume of blood received in culture bottles   Culture  Setup Time   Final    GRAM NEGATIVE RODS IN BOTH AEROBIC AND ANAEROBIC BOTTLES CRITICAL VALUE NOTED.  VALUE IS  CONSISTENT WITH PREVIOUSLY REPORTED AND CALLED VALUE.    Culture (A)  Final    ESCHERICHIA COLI SUSCEPTIBILITIES PERFORMED ON PREVIOUS CULTURE WITHIN THE LAST 5 DAYS. Performed at [facility], [address], [place], [place] [address]    Report Status 03/21/2018 FINAL  Final  Blood Culture ID Panel (Reflexed)     Status: Abnormal   Collection Time: 03/18/18  7:13 PM  Result Value Ref Range Status   Enterococcus species NOT DETECTED NOT DETECTED Final   Listeria monocytogenes NOT DETECTED NOT DETECTED Final   Staphylococcus species NOT DETECTED NOT DETECTED Final   Staphylococcus aureus (BCID) NOT DETECTED NOT DETECTED Final   Streptococcus species NOT DETECTED NOT DETECTED Final   Streptococcus agalactiae NOT DETECTED NOT DETECTED Final   Streptococcus pneumoniae NOT DETECTED NOT DETECTED Final   Streptococcus pyogenes NOT DETECTED NOT DETECTED Final   Acinetobacter baumannii NOT DETECTED NOT DETECTED Final    Enterobacteriaceae species DETECTED (A) NOT DETECTED Final    Comment: Enterobacteriaceae represent a large family of gram-negative bacteria, not a single organism. CRITICAL RESULT CALLED TO, READ BACK BY AND VERIFIED WITH: Azzie Glatter PharmD 9:50 03/19/18 ([email or web address])    Enterobacter cloacae complex NOT DETECTED NOT DETECTED Final   Escherichia coli DETECTED (A) NOT DETECTED Final    Comment: CRITICAL RESULT CALLED TO, READ BACK BY AND VERIFIED WITH: Azzie Glatter PharmD 9:50 03/19/18 ([email or web address])    Klebsiella oxytoca NOT DETECTED NOT DETECTED Final   Klebsiella pneumoniae NOT DETECTED NOT DETECTED Final   Proteus species NOT DETECTED NOT DETECTED Final   Serratia marcescens NOT DETECTED NOT DETECTED Final   Carbapenem resistance NOT DETECTED NOT DETECTED Final   Haemophilus influenzae NOT DETECTED NOT DETECTED Final   Neisseria meningitidis NOT DETECTED NOT DETECTED Final   Pseudomonas aeruginosa NOT DETECTED NOT DETECTED Final   Candida albicans NOT DETECTED NOT DETECTED Final   Candida glabrata NOT DETECTED NOT DETECTED Final   Candida krusei NOT DETECTED NOT DETECTED Final   Candida parapsilosis NOT DETECTED NOT DETECTED Final   Candida tropicalis NOT DETECTED NOT DETECTED Final    Comment: Performed at [facility], [address], [place], [place] [address]  Surgical pcr screen     Status: Abnormal   Collection Time: 03/18/18 10:51 PM  Result Value Ref Range Status   MRSA, PCR POSITIVE (A) NEGATIVE Final    Comment: RESULT CALLED TO, READ BACK BY AND VERIFIED WITH: STEWART,S RN @0045  ON 03/19/2018 JACKSON,K    Staphylococcus aureus POSITIVE (A) NEGATIVE Final    Comment: (NOTE) The Xpert SA Assay (FDA approved for NASAL specimens in patients 82 years of age and older), is one component of a comprehensive surveillance program. It is not intended to diagnose infection nor to guide or monitor treatment. Performed at [facility], [address], [place], [place]  [address]   Urine culture     Status: None   Collection Time: 03/19/18  6:44 AM  Result Value Ref Range Status   Specimen Description   Final    URINE, CLEAN CATCH Performed at [facility], [address] [place], [place] [address]    Special Requests   Final    NONE Performed at [facility], [address] [place], [place] [address]    Culture   Final    NO GROWTH Performed at [facility], [address] [place], [place] [address]    Report Status 03/20/2018 FINAL  Final     Labs: Basic Metabolic Panel: Recent Labs  Lab 03/20/18 1709 03/21/18 0300 03/22/18 1153 03/24/18 0500 03/25/18 0650 03/26/18 0550  NA  --  142 140 139 138 139  K  --  4.1 3.4* 3.0* 3.6 4.4  CL  --  108 105 100 102 105  CO2  --  26 27 31 26 26   GLUCOSE  --  111* 153* 100* 107* 106*  BUN  --  21* 20 9 13 11   CREATININE  --  0.80 0.73 0.71 0.75 0.79  CALCIUM  --  7.6* 8.0* 8.0* 8.4* 8.7*  MG 2.1 2.3  --   --  2.0  --   PHOS 1.9* 2.1*  --   --   --   --    Liver Function Tests: Recent Labs  Lab 03/22/18 1153 03/24/18 0500  AST 92* 56*  ALT 321* 166*  ALKPHOS 149* 195*  BILITOT 1.5* 1.2  PROT 5.4* 5.2*  ALBUMIN 2.5* 2.5*   CBC: Recent Labs  Lab 03/21/18 0300 03/22/18 1153 03/23/18 0424 03/25/18 0650 03/26/18 0550  WBC 46.7* 55.7* 32.5* 32.1* 20.2*  NEUTROABS  --  46.5* 22.7*  --   --   HGB 10.1* 11.7* 10.8* 12.0 12.3  HCT 31.7* 36.6 33.8* 37.5 38.5  MCV 94.6 93.8 91.4 93.1 92.3  PLT 77* 125* 148* 202 239   Cardiac Enzymes: Recent Labs  Lab 03/23/18 0204 03/23/18 0618 03/23/18 1409  TROPONINI 0.42* 0.38* 0.30*    CBG: Recent Labs  Lab 03/23/18 1634 03/23/18 1925 03/23/18 2327 03/24/18 0339 03/24/18 0804  GLUCAP 88 95 96 110* 91     IMAGING STUDIES Dg Chest 2 View  Result Date: 03/18/2018 CLINICAL DATA:  Abdominal and LEFT flank pain since this morning EXAM: CHEST - 2 VIEW COMPARISON:  None FINDINGS: Borderline  enlargement of cardiac silhouette. Mediastinal contours and pulmonary vascularity normal. RIGHT basilar atelectasis and generally low lung volumes. Upper lungs clear. No pneumothorax. Bones unremarkable. IMPRESSION: Low lung volumes with accentuated heart size and RIGHT basilar atelectasis. Electronically Signed   By: Ulyses Southward M.D.   On: 03/18/2018 20:06   Dg Abd 1 View  Result Date: 03/19/2018 CLINICAL DATA:  Orogastric tube placement EXAM: ABDOMEN - 1 VIEW COMPARISON:  Portable exam 1032 hours compared to CT abdomen and pelvis 03/18/2017 FINDINGS: Tip of orogastric tube projects over mid stomach. Tip of central venous catheter projects over cavoatrial junction. Bibasilar atelectasis versus consolidation greater on LEFT. Nonobstructive bowel gas pattern. LEFT ureteral stent noted. IMPRESSION: Tip of orogastric tube projects over mid stomach. Electronically Signed   By: Ulyses Southward M.D.   On: 03/19/2018 11:02   Ct Abdomen Pelvis W Contrast  Result Date: 03/18/2018 CLINICAL DATA:  Patient with left flank pain. EXAM: CT ABDOMEN AND PELVIS WITH CONTRAST TECHNIQUE: Multidetector CT imaging of the abdomen and pelvis was performed using the standard protocol following bolus administration of intravenous contrast. CONTRAST:  ISOVUE-300 IOPAMIDOL (ISOVUE-300) INJECTION 61% COMPARISON:  None. FINDINGS: Lower chest: Normal heart size. Small hiatal hernia. Dependent atelectasis within the left lower lobe. No pleural effusion. Hepatobiliary: Liver is normal in size and contour. No focal hepatic lesions identified. Gallbladder is unremarkable. No intrahepatic or extrahepatic biliary ductal dilatation. Pancreas: Unremarkable Spleen: Unremarkable Adrenals/Urinary Tract: Normal adrenal glands. There is delayed enhancement of the left kidney and moderate left hydroureteronephrosis to the level of the distal left ureter were there is an obstructing 4 mm stone (image 77; series 2). Urinary  bladder is unremarkable.  Stomach/Bowel: Transverse and descending colon is decompressed, limiting evaluation. Small bowel feces within the distal small bowel. No free fluid or free intraperitoneal air. Vascular/Lymphatic: Normal caliber abdominal aorta. No retroperitoneal lymphadenopathy. Reproductive: Uterus is unremarkable. Adnexal structures unremarkable. Other: None. Musculoskeletal: Lumbar spine degenerative changes. No aggressive or acute appearing osseous lesions. IMPRESSION: There is an obstructing 4 mm stone within the distal left ureter resulting in mild to moderate left hydroureteronephrosis. Small bowel feces within the distal small bowel which may be secondary to slow transient. Electronically Signed   By: Annia Belt M.D.   On: 03/18/2018 18:23   US Renal  Result Date: 03/19/2018 CLINICAL DATA:  Shock circulatory. EXAM: RENAL / URINARY TRACT ULTRASOUND COMPLETE COMPARISON:  CT scan of March 18, 2018. FINDINGS: Right Kidney: Renal measurements: 12.6 x 5.1 x 4.5 cm = volume: 152 mL . Echogenicity within normal limits. No mass or hydronephrosis visualized. Left Kidney: Renal measurements: 12.2 x 5.7 x 5.5 cm = volume: 200 mL. Echogenicity within normal limits. No mass or hydronephrosis visualized. Bladder: Appears normal for degree of bladder distention. Foley catheter is noted. IMPRESSION: Normal renal ultrasound. Left hydronephrosis noted on prior CT scan is not visualized currently. Electronically Signed   By: Lupita Raider, M.D.   On: 03/19/2018 10:15   Dg Chest Port 1 View  Result Date: 03/23/2018 CLINICAL DATA:  Pulmonary edema EXAM: PORTABLE CHEST 1 VIEW COMPARISON:  03/20/2018 FINDINGS: Right IJ central venous catheter tip is at the cavoatrial junction. There is shallow lung inflation with mild cardiomegaly. Mild pulmonary edema persists. Endotracheal tube and orogastric tube have been removed. IMPRESSION: Unchanged mild pulmonary edema, status post endotracheal extubation. Electronically Signed   By: Deatra Robinson M.D.   On: 03/23/2018 16:44   Dg Chest Port 1 View  Result Date: 03/20/2018 CLINICAL DATA:  Respiratory failure EXAM: PORTABLE CHEST 1 VIEW COMPARISON:  03/19/2018 FINDINGS: Endotracheal tube in good position. Right jugular central venous catheter tip cavoatrial junction unchanged. NG tube in place. Mild improvement in bilateral airspace disease. Small bilateral effusions unchanged. IMPRESSION: Improvement in bilateral edema pattern. Persistent bibasilar atelectasis and effusion. Support lines in good position. Electronically Signed   By: Marlan Palau M.D.   On: 03/20/2018 06:42   Dg Chest Port 1 View  Result Date: 03/19/2018 CLINICAL DATA:  Post intubation, ETT position EXAM: PORTABLE CHEST 1 VIEW COMPARISON:  03/19/2018 at 0103 hours FINDINGS: Endotracheal tube terminates 18 mm above the carina. Stable right IJ venous catheter terminating at the cavoatrial junction. Mild interstitial edema. Right infrahilar opacity, favoring edema, less likely infection. Suspected small bilateral pleural effusions, right greater than left. No pneumothorax. The heart is top-normal in size. IMPRESSION: Endotracheal tube terminates 18 mm above the carina. Mild interstitial edema with small bilateral pleural effusions, right greater than left. Right infrahilar opacity, favoring edema, less likely infection. Electronically Signed   By: Charline Bills M.D.   On: 03/19/2018 02:51   Dg Chest Port 1 View  Result Date: 03/19/2018 CLINICAL DATA:  Postop EXAM: PORTABLE CHEST 1 VIEW COMPARISON:  03/18/2018 FINDINGS: Left upper lobe and right lower lobe opacities, favoring perihilar edema, less likely mild infection. No definite pleural effusions. The heart is normal in size. Right IJ venous catheter terminates at the cavoatrial junction. IMPRESSION: Right IJ venous catheter terminates at the cavoatrial junction. Electronically Signed   By: Charline Bills M.D.   On: 03/19/2018 02:03   Dg C-arm 1-60 Min-no  Report  Result Date: 03/19/2018  Fluoroscopy was utilized by the requesting physician.  No radiographic interpretation.    DISCHARGE EXAMINATION: Vitals:   03/26/18 1821 03/26/18 2148 03/27/18 0505 03/27/18 0712  BP: 112/60 (!) 109/53 (!) 110/54   Pulse: 89 69 78   Resp:  18 16   Temp:  98.1 F (36.7 C) 98.6 F (37 C)   TempSrc:  Oral Oral   SpO2:  98% 91%   Weight:    84 kg  Height:       General appearance: Awake alert.  In no distress Resp: Clear to auscultation bilaterally.  Normal effort Cardio: S1-S2 is normal regular.  No S3-S4.  No rubs murmurs or bruit GI: Abdomen is soft.  Nontender nondistended.  Bowel sounds are present normal.  No masses organomegaly Extremities: No edema.  Full range of motion of lower extremities. Neurologic: Alert and oriented x3.  No focal neurological deficits.    DISPOSITION: Home  Discharge Instructions    (HEART FAILURE PATIENTS) Call MD:  Anytime you have any of the following symptoms: 1) 3 pound weight gain in 24 hours or 5 pounds in 1 week 2) shortness of breath, with or without a dry hacking cough 3) swelling in the hands, feet or stomach 4) if you have to sleep on extra pillows at night in order to breathe.   Complete by:  As directed    Call MD for:  difficulty breathing, headache or visual disturbances   Complete by:  As directed    Call MD for:  extreme fatigue   Complete by:  As directed    Call MD for:  persistant dizziness or light-headedness   Complete by:  As directed    Call MD for:  persistant nausea and vomiting   Complete by:  As directed    Call MD for:  severe uncontrolled pain   Complete by:  As directed    Call MD for:  temperature >100.4   Complete by:  As directed    Diet - low sodium heart healthy   Complete by:  As directed    Discharge instructions   Complete by:  As directed    Please take your medications as prescribed. Do not do strenuous activities. Follow up with PCP within 1 week. Be sure to call Dr.  Estil Daft office to make sure an appointment is scheduled for the urological procedure.  You were cared for by a hospitalist during your hospital stay. If you have any questions about your discharge medications or the care you received while you were in the hospital after you are discharged, you can call the unit and asked to speak with the hospitalist on call if the hospitalist that took care of you is not available. Once you are discharged, your primary care physician will handle any further medical issues. Please note that NO REFILLS for any discharge medications will be authorized once you are discharged, as it is imperative that you return to your primary care physician (or establish a relationship with a primary care physician if you do not have one) for your aftercare needs so that they can reassess your need for medications and monitor your lab values. If you do not have a primary care physician, you can call [phone] for a physician referral.   Increase activity slowly   Complete by:  As directed         Allergies as of 03/27/2018      Reactions   Vicodin [hydrocodone-acetaminophen] Nausea And Vomiting  Medication List    STOP taking these medications   Phenylephrine HCl 5 MG Tabs   predniSONE 10 MG (21) Tbpk tablet Commonly known as:  STERAPRED UNI-PAK 21 TAB     TAKE these medications   acyclovir ointment 5 % Commonly known as:  ZOVIRAX Apply topically every 4 (four) hours.   amphetamine-dextroamphetamine 5 MG tablet Commonly known as:  ADDERALL Take 5 mg by mouth daily.   chlorpheniramine-HYDROcodone 10-8 MG/5ML Suer Commonly known as:  TUSSIONEX Take 5 mLs by mouth every 12 (twelve) hours as needed for cough.   diphenhydramine-acetaminophen 25-500 MG Tabs tablet Commonly known as:  TYLENOL PM Take 1 tablet by mouth at bedtime as needed (sleep).   fluticasone 50 MCG/ACT nasal spray Commonly known as:  FLONASE Place 1 spray into both nostrils 2 (two) times  daily.   LORazepam 1 MG tablet Commonly known as:  ATIVAN Take 1 tablet (1 mg total) by mouth 2 (two) times daily as needed for anxiety.   ondansetron 4 MG tablet Commonly known as:  ZOFRAN Take 1 tablet (4 mg total) by mouth every 8 (eight) hours as needed for nausea or vomiting.   oxyCODONE-acetaminophen 5-325 MG tablet Commonly known as:  PERCOCET/ROXICET Take 1 tablet by mouth every 6 (six) hours as needed for severe pain.   polyethylene glycol packet Commonly known as:  MIRALAX / GLYCOLAX Take 17 g by mouth daily.   senna-docusate 8.6-50 MG tablet Commonly known as:  Senokot-S Take 2 tablets by mouth 2 (two) times daily.        Follow-up Information    Call Jerilee FieldEskridge, Matthew, MD.   Specialty:  Urology Contact information: [address] [place] [address] [phone]        Nahser, Deloris PingPhilip J, MD. Schedule an appointment as soon as possible for a visit in 3 week(s).   Specialty:  Cardiology Contact information: [address] [place] [address] [phone]        Sandford Craze'Sullivan, Melissa, NP. Schedule an appointment as soon as possible for a visit in 1 week(s).   Specialty:  Internal Medicine Contact information: [address] [place] [address] [phone]           TOTAL DISCHARGE TIME: 35 minutes  Desteny Freeman Rito EhrlichKrishnan  [facility] Pager on [email or web address]  03/27/2018, 2:30 PM

## 2018-03-27 NOTE — Progress Notes (Signed)
Pt D/C'd home as ordered. Pt refused to allow RN to discuss D/C paperwork prior to D/C. Pt stated she already knew and handed paperwork to her mother. Pt very adamant about leaving immediately. Pt assisted to exit via W/C with mother and grandma.

## 2018-03-29 ENCOUNTER — Telehealth: Payer: Self-pay | Admitting: *Deleted

## 2018-03-29 NOTE — Telephone Encounter (Signed)
Transition Care Management Follow-up Telephone Call   Date discharged? 03/27/18   How have you been since you were released from the hospital? Pt reports she is doing well   Do you understand why you were in the hospital? yes   Do you understand the discharge instructions? yes   Where were you discharged to? Home w/ boyfriend   Items Reviewed:  Medications reviewed: yes  Allergies reviewed: yes  Dietary changes reviewed: yes  Referrals reviewed: yes   Functional Questionnaire:   Activities of Daily Living (ADLs):   She states they are independent in the following: ambulation, bathing and hygiene, feeding, continence, grooming, toileting and dressing States they require assistance with the following: na   Any transportation issues/concerns?: no   Any patient concerns? yes, pt is having frequent night terrors about the hospital since she has been home. States she will discuss with PCP at appt   Confirmed importance and date/time of follow-up visits scheduled yes  Provider Appointment [facility] 04/05/18 @440   Confirmed with patient if condition begins to worsen call PCP or go to the ER.  Patient was given the office number and encouraged to call back with question or concerns.  : yes

## 2018-04-01 ENCOUNTER — Encounter: Payer: Self-pay | Admitting: Registered Nurse

## 2018-04-01 ENCOUNTER — Ambulatory Visit: Payer: Self-pay | Admitting: Registered Nurse

## 2018-04-01 DIAGNOSIS — D696 Thrombocytopenia, unspecified: Secondary | ICD-10-CM

## 2018-04-01 DIAGNOSIS — R945 Abnormal results of liver function studies: Secondary | ICD-10-CM

## 2018-04-01 DIAGNOSIS — R7989 Other specified abnormal findings of blood chemistry: Secondary | ICD-10-CM

## 2018-04-01 DIAGNOSIS — N201 Calculus of ureter: Secondary | ICD-10-CM

## 2018-04-01 DIAGNOSIS — Z87440 Personal history of urinary (tract) infections: Secondary | ICD-10-CM

## 2018-04-01 DIAGNOSIS — Z87448 Personal history of other diseases of urinary system: Secondary | ICD-10-CM

## 2018-04-01 LAB — POCT URINALYSIS DIPSTICK
GLUCOSE UA: NEGATIVE
Ketones, POC UA: NEGATIVE mg/dL
Nitrite, UA: NEGATIVE
Protein, UA: NEGATIVE
Spec Grav, UA: 1.015 (ref 1.010–1.025)
Urobilinogen, UA: 0.2 E.U./dL
pH, UA: 6.5 (ref 5.0–8.0)

## 2018-04-01 NOTE — Patient Instructions (Addendum)
Dietary Guidelines to Help Prevent Kidney Stones Kidney stones are deposits of minerals and salts that form inside your kidneys. Your risk of developing kidney stones may be greater depending on your diet, your lifestyle, the medicines you take, and whether you have certain medical conditions. Most people can reduce their chances of developing kidney stones by following the instructions below. Depending on your overall health and the type of kidney stones you tend to develop, your dietitian may give you more specific instructions. What are tips for following this plan? Reading food labels  Choose foods with "no salt added" or "low-salt" labels. Limit your sodium intake to less than 1500 mg per day.  Choose foods with calcium for each meal and snack. Try to eat about 300 mg of calcium at each meal. Foods that contain 200-500 mg of calcium per serving include: ? 8 oz (237 ml) of milk, fortified nondairy milk, and fortified fruit juice. ? 8 oz (237 ml) of kefir, yogurt, and soy yogurt. ? 4 oz (118 ml) of tofu. ? 1 oz of cheese. ? 1 cup (300 g) of dried figs. ? 1 cup (91 g) of cooked broccoli. ? 1-3 oz can of sardines or mackerel.  Most people need 1000 to 1500 mg of calcium each day. Talk to your dietitian about how much calcium is recommended for you. Shopping  Buy plenty of fresh fruits and vegetables. Most people do not need to avoid fruits and vegetables, even if they contain nutrients that may contribute to kidney stones.  When shopping for convenience foods, choose: ? Whole pieces of fruit. ? Premade salads with dressing on the side. ? Low-fat fruit and yogurt smoothies.  Avoid buying frozen meals or prepared deli foods.  Look for foods with live cultures, such as yogurt and kefir. Cooking  Do not add salt to food when cooking. Place a salt shaker on the table and allow each person to add his or her own salt to taste.  Use vegetable protein, such as beans, textured vegetable  protein (TVP), or tofu instead of meat in pasta, casseroles, and soups. Meal planning   Eat less salt, if told by your dietitian. To do this: ? Avoid eating processed or premade food. ? Avoid eating fast food.  Eat less animal protein, including cheese, meat, poultry, or fish, if told by your dietitian. To do this: ? Limit the number of times you have meat, poultry, fish, or cheese each week. Eat a diet free of meat at least 2 days a week. ? Eat only one serving each day of meat, poultry, fish, or seafood. ? When you prepare animal protein, cut pieces into small portion sizes. For most meat and fish, one serving is about the size of one deck of cards.  Eat at least 5 servings of fresh fruits and vegetables each day. To do this: ? Keep fruits and vegetables on hand for snacks. ? Eat 1 piece of fruit or a handful of berries with breakfast. ? Have a salad and fruit at lunch. ? Have two kinds of vegetables at dinner.  Limit foods that are high in a substance called oxalate. These include: ? Spinach. ? Rhubarb. ? Beets. ? Potato chips and french fries. ? Nuts.  If you regularly take a diuretic medicine, make sure to eat at least 1-2 fruits or vegetables high in potassium each day. These include: ? Avocado. ? Banana. ? Orange, prune, carrot, or tomato juice. ? Baked potato. ? Cabbage. ? Beans and split   peas. General instructions   Drink enough fluid to keep your urine clear or pale yellow. This is the most important thing you can do.  Talk to your health care provider and dietitian about taking daily supplements. Depending on your health and the cause of your kidney stones, you may be advised: ? Not to take supplements with vitamin C. ? To take a calcium supplement. ? To take a daily probiotic supplement. ? To take other supplements such as magnesium, fish oil, or vitamin B6.  Take all medicines and supplements as told by your health care provider.  Limit alcohol intake to no  more than 1 drink a day for nonpregnant women and 2 drinks a day for men. One drink equals 12 oz of beer, 5 oz of wine, or 1 oz of hard liquor.  Lose weight if told by your health care provider. Work with your dietitian to find strategies and an eating plan that works best for you. What foods are not recommended? Limit your intake of the following foods, or as told by your dietitian. Talk to your dietitian about specific foods you should avoid based on the type of kidney stones and your overall health. Grains Breads. Bagels. Rolls. Baked goods. Salted crackers. Cereal. Pasta. Vegetables Spinach. Rhubarb. Beets. Canned vegetables. Rosita Fire. Olives. Meats and other protein foods Nuts. Nut butters. Large portions of meat, poultry, or fish. Salted or cured meats. Deli meats. Hot dogs. Sausages. Dairy Cheese. Beverages Regular soft drinks. Regular vegetable juice. Seasonings and other foods Seasoning blends with salt. Salad dressings. Canned soups. Soy sauce. Ketchup. Barbecue sauce. Canned pasta sauce. Casseroles. Pizza. Lasagna. Frozen meals. Potato chips. [place] fries. Summary  You can reduce your risk of kidney stones by making changes to your diet.  The most important thing you can do is drink enough fluid. You should drink enough fluid to keep your urine clear or pale yellow.  Ask your health care provider or dietitian how much protein from animal sources you should eat each day, and also how much salt and calcium you should have each day. This information is not intended to replace advice given to you by your health care provider. Make sure you discuss any questions you have with your health care provider. Document Released: 06/14/2010 Document Revised: 01/29/2016 Document Reviewed: 01/29/2016 Elsevier Interactive Patient Education  2019 [employer]. Hypomagnesemia Hypomagnesemia is a condition in which the level of magnesium in the blood is low. Magnesium is a mineral that is found  in many foods. It is used in many different processes in the body. Hypomagnesemia can affect every organ in the body. In severe cases, it can cause life-threatening problems. What are the causes? This condition may be caused by:  Not getting enough magnesium in your diet.  Malnutrition.  Problems with absorbing magnesium from the intestines.  Dehydration.  Alcohol abuse.  Vomiting.  Severe or chronic diarrhea.  Some medicines, including medicines that make you urinate more (diuretics).  Certain diseases, such as kidney disease, diabetes, celiac disease, and overactive thyroid. What are the signs or symptoms? Symptoms of this condition include:  Loss of appetite.  Nausea and vomiting.  Involuntary shaking or trembling of a body part (tremor).  Muscle weakness.  Tingling in the arms and legs.  Sudden tightening of muscles (muscle spasms).  Confusion.  Psychiatric issues, such as depression, irritability, or psychosis.  A feeling of fluttering of the heart.  Seizures. These symptoms are more severe if magnesium levels drop suddenly. How is this  diagnosed? This condition may be diagnosed based on:  Your symptoms and medical history.  A physical exam.  Blood and urine tests. How is this treated? Treatment depends on the cause and the severity of the condition. It may be treated with:  A magnesium supplement. This can be taken in pill form. If the condition is severe, magnesium is usually given through an IV.  Changes to your diet. You may be directed to eat foods that have a lot of magnesium, such as green leafy vegetables, peas, beans, and nuts.  Stopping any intake of alcohol. Follow these instructions at home:      Make sure that your diet includes foods with magnesium. Foods that have a lot of magnesium in them include: ? Green leafy vegetables, such as spinach and broccoli. ? Beans and peas. ? Nuts and seeds, such as almonds and sunflower  seeds. ? Whole grains, such as whole grain bread and fortified cereals.  Take magnesium supplements if your health care provider tells you to do that. Take them as directed.  Take over-the-counter and prescription medicines only as told by your health care provider.  Have your magnesium levels monitored as told by your health care provider.  When you are active, drink fluids that contain electrolytes.  Avoid drinking alcohol.  Keep all follow-up visits as told by your health care provider. This is important. Contact a health care provider if:  You get worse instead of better.  Your symptoms return. Get help right away if you:  Develop severe muscle weakness.  Have trouble breathing.  Feel that your heart is racing. Summary  Hypomagnesemia is a condition in which the level of magnesium in the blood is low.  Hypomagnesemia can affect every organ in the body.  Treatment may include eating more foods that contain magnesium, taking magnesium supplements, and not drinking alcohol.  Have your magnesium levels monitored as told by your health care provider. This information is not intended to replace advice given to you by your health care provider. Make sure you discuss any questions you have with your health care provider. Document Released: 11/13/2004 Document Revised: 01/19/2017 Document Reviewed: 01/19/2017  Liver Function Tests Why am I having this test? Liver function tests are done to see how well your liver is working. The proteins and enzymes measured in the test can alert your health care provider to inflammation, damage, or disease in your liver. It is common to have liver function tests:  When you are taking certain medicines.  If you have liver disease.  If you drink a lot of alcohol.  When you are not feeling well.  When you have other conditions that may affect your liver.  During annual physical exams.  If you have symptoms such as yellowing of the skin  (jaundice), abdominal pain, or nausea and vomiting. What is being tested? These tests measure various substances in your blood. This may include:  Alanine transaminase (ALT). This is an enzyme in the liver.  Aspartate transaminase (AST). This is an enzyme in the liver, heart, and muscles.  Alkaline phosphatase (ALP). This is a protein in the liver, bile ducts, bone, and other body tissues.  Total bilirubin. This is a yellow pigment in bile.  Albumin. This is a protein in the liver.  Prothrombin time and international normalized ratio (PT and INR). PT measures the time it takes for your blood to clot. INR is a calculation of blood clotting time based on your PT result. It is also  calculated based on normal ranges defined by the lab that processed your test.  Total protein. This includes two proteins, albumin and globulin, found in the blood. What kind of sample is taken?  A blood sample is required for this test. It is usually collected by inserting a needle into a blood vessel. How do I prepare for this test? How you prepare will depend on which tests are being done and the reason for doing them. You may need to:  Avoid eating for 4-6 hours before the test, or as told by your health care provider.  Stop taking certain medicines before your blood test, as told by your health care provider. Tell a health care provider about:  All medicines you are taking, including vitamins, herbs, eye drops, creams, and over-the-counter medicines.  Any medical conditions you have.  Whether you are pregnant or may be pregnant. How are the results reported? Your test results will be reported as values. Your health care provider will compare your results to normal ranges that were established after testing a large group of people (reference ranges). Reference ranges may vary among labs and hospitals. For the substances measured in liver function tests, common reference ranges are: ALT  Infant: 10-40  international units/L.  Child or adult: 4-36 international units/L at 37C or 4-36 units/L (SI units).  Reference ranges may be higher for older adults. AST  Newborn 30-31 days old: 35-140 units/L.  Child younger than 36 years old: 15-60 units/L.  67-34 years old: 15-50 units/L.  82-1 years old: 10-50 units/L.  81-25 years old: 10-40 units/L.  Adult: 0-35 units/L or 0-0.58 ?kat/L (SI units).  Reference ranges may be higher for older adults. ALP  Child younger than 34 years old: 85-235 units/L.  68-82 years old: 65-210 units/L.  40-47 years old: 60-300 units/L.  41-30 years old: 30-200 units/L.  Adult: 30-120 units/L or 0.5-2.0 ?kat/L (SI units).  Reference ranges may be higher for older adults. Total bilirubin  Newborn: 1.0-12.0 mg/dL or 16.1-096 ?mol/L (SI units).  Child or adult: 0.3-1.0 mg/dL or 0.4-54 ?mol/L. Albumin  Premature infant: 3.0-4.2 g/dL.  Newborn: 3.5-5.4 g/dL.  Infant: 4.4-5.4 g/dL.  Child: 4.0-5.9 g/dL.  Adult: 3.5-5.0 g/dL or 09-81 g/L (SI units). PT  11.0-12.5 seconds; 85%-100%. INR  0.8-1.1. Total protein  Premature infant: 4.2-7.6 g/dL.  Newborn: 4.6-7.4 g/dL.  Infant: 6.0-6.7 g/dL.  Child: 6.2-8.0 g/dL.  Adult: 6.4-8.3 g/dL or 19-14 g/L (SI units). What do the results mean? Results that are within the reference ranges are considered normal. For each substance measured, results outside the reference range can indicate various health issues. ALT  Levels above the normal range may indicate liver disease. AST  Levels above the normal range may indicate liver disease. Sometimes levels also increase after burns, surgery, heart attack, muscle damage, or seizure. ALP  Levels above the normal range may be seen in biliary obstruction, liver diseases, bone disease, thyroid disease, tumors, fractures, leukemia, lymphoma, or several other conditions. People with blood type O or B may show higher levels after a fatty meal.  Levels below  the normal range may indicate bone and teeth conditions, malnutrition, protein deficiency, or Wilson's disease. Total bilirubin  Levels above the normal range may indicate problems with the liver, gallbladder, or bile ducts. Albumin  Levels above the normal range may indicate dehydration. They may also be caused by a diet that is high in protein.  Levels below the normal range may indicate kidney disease, liver disease, or malabsorption of  nutrients. PT and INR  Levels above the normal range mean that your blood is clotting slower than normal. This may be due to blood disorders, liver disorders, or low levels of vitamin K. Total protein  Levels above the normal range may be due to infection or other diseases.  Levels below the normal range may be due to an immune system disorder, bleeding, burns, kidney disorder, liver disease, trouble absorbing or getting nutrients, or other conditions that affect the intestines. Talk with your health care provider about what your results mean. Questions to ask your health care provider Ask your health care provider, or the department that is doing the test:  When will my results be ready?  How will I get my results?  What are my treatment options?  What other tests do I need?  What are my next steps? Summary  Liver function tests are done to see how well your liver is working.  These tests measure various proteins and enzymes in your blood. The results can alert your health care provider to inflammation, damage, or disease in your liver.  Talk with your health care provider about what your results mean. This information is not intended to replace advice given to you by your health care provider. Make sure you discuss any questions you have with your health care provider. Document Released: 03/22/2004 Document Revised: 12/02/2016 Document Reviewed: 12/02/2016 Elsevier Interactive Patient Education  2019 [employer] Hypocalcemia,  Adult Hypocalcemia is when the level of calcium in a person's blood is below normal. Calcium is a mineral that is used by the body in many ways. A lack of blood calcium can affect the heart and muscles, make the bones more likely to break, and cause other problems. What are the causes? This condition may be caused by:  Decreased production (hypoparathyroidism) or improper use of parathyroid hormone.  Problems with the parathyroid glands or surgical removal of these glands.  Problems with parathyroid function after removal of the thyroid gland.  Lack (deficiency) of vitamin D or magnesium or both.  Kidney problems. Less common causes include:  Intestinal problems that interfere with nutrient absorption.  Alcoholism.  Low levels of a body protein that is called albumin.  Inflammation of the pancreas (pancreatitis).  Certain medicines.  Severe infections (sepsis).  Certain diseases, such as sarcoidosis or hemochromatosis, that cause the parathyroid glands to be filled with cells or substances that are not normally present.  Breakdown of large amounts of muscle fiber.  High levels of phosphate in the body.  Cancer.  Massive blood transfusions, which usually occur with severe trauma. What are the signs or symptoms? Symptoms of this condition include:  Numbness and tingling in the fingers, toes, or around the mouth.  Muscle aches or cramps, especially in the legs, feet, and back.  Muscle twitches.  Abdominal cramping or pain.  Memory problems, confusion, or difficulty thinking.  Depression, anxiety, irritability, or changes in personality.  Fainting.  Chest pain.  Difficulty swallowing.  Changes in the sound of the voice.  Shortness of breath or wheezing.  General weakness and fatigue. Symptoms of severe hypocalcemia include:  Shaking uncontrollably (seizures).  Seizure of the voice box (laryngospasm).  Fast heartbeats (palpitations) and abnormal heart  rhythms (arrhythmias). Long-term symptoms of this condition include:  Coarse, brittle hair and nails.  Dry skin or lasting (chronic) skin diseases (psoriasis, eczema,, or dermatitis).  Clouding of the eye lens (cataracts). How is this diagnosed? This condition is usually diagnosed with a blood test.  You may also have other tests to help determine the underlying cause of the condition. For example, a test may be done that records the electrical activity of the heart (electrocardiogram,or ECG). How is this treated? Treatment for this condition may include:  Calcium given by mouth (orally) or given through an IV tube that is inserted into one of your veins. The method used for giving calcium will depend on the severity of the condition.  Other minerals (electrolytes), such as magnesium. Other treatment will depend on the cause of the condition. Follow these instructions at home:  Follow diet instructions from your health care provider or dietitian.  Take supplements only as told by your health care provider.  Keep all follow-up visits as told by your health care provider. This is important. Contact a health care provider if:  You have increased fatigue.  You have increased muscle twitching.  You have new swelling in the feet, ankles, or legs.  You develop changes in mood, memory, or personality. Get help right away if:  You have chest pain.  You have persistent rapid or irregular heartbeats.  You have difficulty breathing.  You faint.  You start to have seizures.  You have confusion. This information is not intended to replace advice given to you by your health care provider. Make sure you discuss any questions you have with your health care provider. Document Released: 08/07/2009 Document Revised: 07/26/2015 Document Reviewed: 07/05/2014 Elsevier Interactive Patient Education  2019 [employer].  Risk analyst Patient Education  [employer].

## 2018-04-01 NOTE — Progress Notes (Signed)
Subjective:    Patient ID: Stephanie Patrick, female    DOB: Sep 28, 1985, 33 y.o.   MRN: [id number]  32y/o established single female presenting for f/u post hospital discharge on 03/27/18.  Was admitted through ER after office visit 03/18/2018 for kidney stone.  Dx'd E.coli sepsis, pyelonephritis after stent placement with cardiac dysfunction and required reintubation/ICU stay.  IV antibiotics completed.   PCM appt scheduled for Monday 3 Feb.  Stephanie Patrick would like labs drawn today. Has appt for cardiology f/u in 2-3 weeks. Urology f/u pending, pt having difficulty contacting office to schedule stent and stone removal left ureter.  Pt reports continued bladder pain/spasms, mild-moderate. Denied inability to urinate, urinary frequency or urgency. Some blood on tissue when blowing her nose and sore throat persist after extubation and oxygen via nasal canula. RN Zella BallWorkman advised nasal saline spray q2h wa for nasal dryness. Patient reported feeling well otherwise first day back at work today wanted to return Monday but hospitalist would not approve for her to return so soon after stay in ICU.  Weight back to normal.  A little fatigue with activity.  Denied swelling hands/feet/ankles, fever/chills, n/v/d, chest pain, shortness of breath, wheezing.  Sleeping better with medication but still sometimes waking up with anxiety after hospital stay.      Review of Systems  Constitutional: Positive for fatigue. Negative for activity change, appetite change, chills, diaphoresis and fever.  HENT: Positive for nosebleeds and sore throat. Negative for dental problem, drooling, ear discharge, ear pain, facial swelling, hearing loss, mouth sores, sinus pressure, sinus pain, tinnitus, trouble swallowing and voice change.   Eyes: Negative for photophobia, pain, discharge, redness, itching and visual disturbance.  Respiratory: Negative for shortness of breath, wheezing and stridor.   Cardiovascular: Negative for palpitations and leg  swelling.  Gastrointestinal: Positive for abdominal pain. Negative for diarrhea, nausea and vomiting.  Endocrine: Negative for cold intolerance and heat intolerance.  Genitourinary: Positive for dysuria. Negative for difficulty urinating, enuresis, flank pain, genital sores and menstrual problem.  Musculoskeletal: Negative for arthralgias, gait problem, joint swelling, neck pain and neck stiffness.  Skin: Negative for color change, pallor and rash.  Allergic/Immunologic: Positive for environmental allergies. Negative for food allergies.  Neurological: Negative for dizziness, tremors, seizures, syncope, facial asymmetry, speech difficulty, weakness, light-headedness, numbness and headaches.  Hematological: Negative for adenopathy. Does not bruise/bleed easily.  Psychiatric/Behavioral: Positive for sleep disturbance. Negative for agitation, behavioral problems, confusion and decreased concentration. The patient is nervous/anxious.        Objective:   Physical Exam Vitals signs and nursing note reviewed.  Constitutional:      General: Stephanie Patrick is not in acute distress.    Appearance: Normal appearance. Stephanie Patrick is well-developed and well-groomed. Stephanie Patrick is not ill-appearing, toxic-appearing or diaphoretic.     Comments: Wearing dress and jean jacket with knee high leather boots  HENT:     Head: Normocephalic and atraumatic.     Jaw: There is normal jaw occlusion. No trismus.     Right Ear: Hearing, ear canal and external ear normal. A middle ear effusion is present. There is no impacted cerumen.     Left Ear: Hearing, ear canal and external ear normal. A middle ear effusion is present. There is no impacted cerumen.     Nose: Mucosal edema present. No nasal deformity, septal deviation, laceration, congestion or rhinorrhea.     Right Turbinates: Enlarged and swollen. Not pale.     Left Turbinates: Enlarged and swollen. Not pale.  Right Sinus: No maxillary sinus tenderness or frontal sinus tenderness.      Left Sinus: No maxillary sinus tenderness or frontal sinus tenderness.     Mouth/Throat:     Lips: Pink. No lesions.     Mouth: Mucous membranes are moist. Mucous membranes are not pale, not dry and not cyanotic. No lacerations, oral lesions or angioedema.     Dentition: Normal dentition. Does not have dentures. No dental caries or dental abscesses.     Tongue: No lesions.     Pharynx: Uvula midline. Pharyngeal swelling and posterior oropharyngeal erythema present. No oropharyngeal exudate or uvula swelling.     Tonsils: No tonsillar exudate or tonsillar abscesses. Swelling: 0 on the right. 0 on the left.     Comments: Macular erythema oropharynx; cobblestoning mild posterior pharynx; bilateral allergic shiners; nasal turbinates edema erythema clear discharge Eyes:     General: Lids are normal. Allergic shiner present. No visual field deficit or scleral icterus.       Right eye: No foreign body, discharge or hordeolum.        Left eye: No foreign body, discharge or hordeolum.     Extraocular Movements: Extraocular movements intact.     Right eye: Normal extraocular motion and no nystagmus.     Left eye: Normal extraocular motion and no nystagmus.     Conjunctiva/sclera: Conjunctivae normal.     Right eye: Right conjunctiva is not injected. No chemosis, exudate or hemorrhage.    Left eye: Left conjunctiva is not injected. No chemosis, exudate or hemorrhage.    Pupils: Pupils are equal, round, and reactive to light. Pupils are equal.     Right eye: Pupil is round and reactive.     Left eye: Pupil is round and reactive.  Neck:     Musculoskeletal: Normal range of motion and neck supple. Normal range of motion. No edema, erythema, neck rigidity, crepitus or muscular tenderness.     Thyroid: No thyroid mass or thyromegaly.     Trachea: Trachea and phonation normal. No tracheal tenderness, abnormal tracheal secretions or tracheal deviation.  Cardiovascular:     Rate and Rhythm: Normal rate and  regular rhythm.     Chest Wall: PMI is not displaced.     Pulses: Normal pulses.     Heart sounds: Normal heart sounds, S1 normal and S2 normal. No murmur. No friction rub. No gallop.   Pulmonary:     Effort: Pulmonary effort is normal. No accessory muscle usage or respiratory distress.     Breath sounds: Normal breath sounds and air entry. No stridor, decreased air movement or transmitted upper airway sounds. No decreased breath sounds, wheezing, rhonchi or rales.     Comments: No cough observed in exam room; spoke full sentences without difficulty Chest:     Chest wall: No tenderness.  Abdominal:     General: Abdomen is flat. Bowel sounds are decreased. There is no distension or abdominal bruit. There are no signs of injury.     Palpations: Abdomen is soft. There is no shifting dullness, fluid wave, hepatomegaly, splenomegaly, mass or pulsatile mass.     Tenderness: There is abdominal tenderness in the suprapubic area. There is no right CVA tenderness, left CVA tenderness, guarding or rebound. Negative signs include Murphy's sign.     Hernia: No hernia is present. There is no hernia in the umbilical area or ventral area.       Comments: Dull to percussion x 4 quads; hypoactive bowel sounds  x 4 quads; eating pineapple chunks while in clinic and drank water  Musculoskeletal: Normal range of motion.        General: No swelling or tenderness.     Right shoulder: Normal.     Left shoulder: Normal.     Right elbow: Normal.    Left elbow: Normal.     Right hip: Normal.     Left hip: Normal.     Right knee: Normal.     Left knee: Normal.     Cervical back: Normal.     Thoracic back: Normal.     Lumbar back: Normal.     Right hand: Normal.     Left hand: Normal.     Right lower leg: No edema.     Left lower leg: No edema.  Lymphadenopathy:     Head:     Right side of head: No submental, submandibular, tonsillar, preauricular, posterior auricular or occipital adenopathy.     Left  side of head: No submental, submandibular, tonsillar, preauricular, posterior auricular or occipital adenopathy.     Cervical: No cervical adenopathy.     Right cervical: No superficial, deep or posterior cervical adenopathy.    Left cervical: No superficial, deep or posterior cervical adenopathy.  Skin:    General: Skin is warm and dry.     Capillary Refill: Capillary refill takes less than 2 seconds.     Coloration: Skin is not ashen, cyanotic, jaundiced, mottled, pale or sallow.     Findings: No abrasion, bruising, burn, ecchymosis, erythema, laceration, lesion, petechiae or rash.     Nails: There is no clubbing.   Neurological:     General: No focal deficit present.     Mental Status: Stephanie Patrick is alert and oriented to person, place, and time. Mental status is at baseline. Stephanie Patrick is not disoriented.     GCS: GCS eye subscore is 4. GCS verbal subscore is 5. GCS motor subscore is 6.     Cranial Nerves: Cranial nerves are intact. No cranial nerve deficit, dysarthria or facial asymmetry.     Sensory: Sensation is intact. No sensory deficit.     Motor: Motor function is intact. No weakness, tremor, atrophy, abnormal muscle tone or seizure activity.     Coordination: Coordination is intact. Coordination normal.     Gait: Gait normal.     Comments: Wearing 2 inch leather knee high boots gait sure and steady in hallway; on/off exam table and in/out of chair without difficulty; standing to sitting to supine and reverse quickly  Psychiatric:        Attention and Perception: Attention and perception normal.        Mood and Affect: Affect normal. Mood is elated.        Speech: Speech normal.        Behavior: Behavior normal. Behavior is cooperative.        Thought Content: Thought content normal.        Cognition and Memory: Cognition and memory normal.        Judgment: Judgment normal.     Comments: Patient talking about hospital experience during VS, happy to be at work and out of the hospital, high  energy and eating fresh fruit snack between talking, blood draw, exam    Reviewed labs in Epic from hospitalization showed resolving hypocalcemia/hypomagnesemia/thrombocytopenia, elevated LFTs slowly improving.  Will recheck these today.  Nonfasting serum sample today.  Patient back to preadmission weight today.   Update 04/02/2018 labs  showing improving today thrombocytopenia resolved but platelets now elevated and hypomagnesemia/hypocalcemia resolved.  LFTs slightly improved but still elevated.  RN Rolly Salter will notify patient of results at work today.  See results notes.  Urine culture still pending.       Assessment & Plan:  A-left ureteral stone, history acute pyelonephritis/e coli sepsis, hypocalcemia, hypomagnesemia, elevated LFTs, thrombocytopenia, acute cardiomyopathy  P-dipstick urine/urine culture sent today to [facility].  Serum exec panel/CBC drawn to follow up hospitalization due to low magnesium/calcium/sepsis.  Last labs 03/26/2018. RN Rolly Salter in clinic tomorrow to discuss serum lab results with patient.  Discussed urine culture may take 72 hours + for results.  Diet as tolerated regular ensure fruits/vegetables/dairy/lean protein.  Hydrate with water or alternating with powerade/gatorade.  Avoid alcohol intake.   Discussed with patient to ask urology if testing will be performed to determine stone type after removal.  If unable to reach urology office notify RN [facility] for assistance with follow up appt scheduling.  Keep Monday appt with [facility] as last PCM appt May 2019 annual due this next quarter.  Medications as directed by hospitalist on discharge paperwork. Patient is also to push fluids. Hydrate, avoid dehydration. Avoid holding urine void on frequent basis every 4 to 6 hours.  Exitcare handout on diet to prevent kidney stones, hypocalcermia, hypomagnesmia, liver function test.  Notify urology/[facility] staff if fever/chills, unable to urinate every 8 hours, gross hematuria, worsening abdomen or new  back pain, swelling in hands/feet same day. Review hospital discharge paperwork and if any of the Red Flags occur that were listed contact clinic/seek evaluation from provider especially if large weight gain with daily weights.   Patient verbalized agreement and understanding of treatment plan and had no further questions at this time.  P2: Hydrate and cranberry juice

## 2018-04-02 ENCOUNTER — Other Ambulatory Visit: Payer: Self-pay | Admitting: Urology

## 2018-04-02 LAB — CMP12+LP+TP+TSH+6AC+CBC/D/PLT
A/G RATIO: 1.5 (ref 1.2–2.2)
ALT: 68 IU/L — ABNORMAL HIGH (ref 0–32)
AST: 36 IU/L (ref 0–40)
Albumin: 4.8 g/dL (ref 3.8–4.8)
Alkaline Phosphatase: 178 IU/L — ABNORMAL HIGH (ref 39–117)
BUN/Creatinine Ratio: 12 (ref 9–23)
BUN: 10 mg/dL (ref 6–20)
Basophils Absolute: 0 10*3/uL (ref 0.0–0.2)
Basos: 1 %
Bilirubin Total: 0.6 mg/dL (ref 0.0–1.2)
Calcium: 10 mg/dL (ref 8.7–10.2)
Chloride: 98 mmol/L (ref 96–106)
Chol/HDL Ratio: 9.1 ratio — ABNORMAL HIGH (ref 0.0–4.4)
Cholesterol, Total: 272 mg/dL — ABNORMAL HIGH (ref 100–199)
Creatinine, Ser: 0.84 mg/dL (ref 0.57–1.00)
EOS (ABSOLUTE): 0 10*3/uL (ref 0.0–0.4)
Eos: 0 %
Estimated CHD Risk: 2.6 times avg. — ABNORMAL HIGH (ref 0.0–1.0)
Free Thyroxine Index: 2.6 (ref 1.2–4.9)
GFR calc Af Amer: 106 mL/min/{1.73_m2} (ref >59)
GFR calc non Af Amer: 92 mL/min/{1.73_m2} (ref >59)
GGT: 122 IU/L — ABNORMAL HIGH (ref 0–60)
Globulin, Total: 3.2 g/dL (ref 1.5–4.5)
Glucose: 134 mg/dL — ABNORMAL HIGH (ref 65–99)
HDL: 30 mg/dL — ABNORMAL LOW (ref >39)
HEMATOCRIT: 41.9 % (ref 34.0–46.6)
Hemoglobin: 13.7 g/dL (ref 11.1–15.9)
Immature Grans (Abs): 0 10*3/uL (ref 0.0–0.1)
Immature Granulocytes: 0 %
Iron: 72 ug/dL (ref 27–159)
LDH: 214 IU/L (ref 119–226)
LDL CALC: 203 mg/dL — AB (ref 0–99)
LYMPHS ABS: 1.7 10*3/uL (ref 0.7–3.1)
Lymphs: 24 %
MCH: 29.7 pg (ref 26.6–33.0)
MCHC: 32.7 g/dL (ref 31.5–35.7)
MCV: 91 fL (ref 79–97)
Monocytes Absolute: 0.5 10*3/uL (ref 0.1–0.9)
Monocytes: 7 %
Neutrophils Absolute: 4.9 10*3/uL (ref 1.4–7.0)
Neutrophils: 68 %
Phosphorus: 3.4 mg/dL (ref 3.0–4.3)
Platelets: 707 10*3/uL — ABNORMAL HIGH (ref 150–450)
Potassium: 4.3 mmol/L (ref 3.5–5.2)
RBC: 4.61 x10E6/uL (ref 3.77–5.28)
RDW: 13.3 % (ref 11.7–15.4)
Sodium: 139 mmol/L (ref 134–144)
T3 Uptake Ratio: 27 % (ref 24–39)
T4, Total: 9.8 ug/dL (ref 4.5–12.0)
TSH: 1.81 u[IU]/mL (ref 0.450–4.500)
Total Protein: 8 g/dL (ref 6.0–8.5)
Triglycerides: 193 mg/dL — ABNORMAL HIGH (ref 0–149)
Uric Acid: 6.5 mg/dL (ref 2.5–7.1)
VLDL Cholesterol Cal: 39 mg/dL (ref 5–40)
WBC: 7.1 10*3/uL (ref 3.4–10.8)

## 2018-04-02 LAB — MAGNESIUM: Magnesium: 2.1 mg/dL (ref 1.6–2.3)

## 2018-04-02 NOTE — Progress Notes (Signed)
Results reviewed with pt in clinic today. Glucose WNL as nonfasting draw. Advised pt to keep f/u appt that is scheduled with her pcp for next week as cholesterol, LFTs, and platelets all still abnormal, though improved in some instances. Pt agreeable to this. UA and Urine culture still pending. She reports continued constant bladder pain. No change with urination. Is trying to limit narcotic use and does not take any pain medication while at work. Stent removal surgery by urology scheduled for 2/14. No OV prior to this. Advised pt she could call urology office to inquire about continued pain and see if they recommend an OV prior to surgery date. Will update pt once urine culture is resulted. Pt verbalizes understanding and agreement. Will route results to pcp per pt request. Pcp also in [facility] system and will be able to view in [facility]. Copy of labs provided to pt. She has no further questions/concerns.

## 2018-04-03 LAB — URINE CULTURE

## 2018-04-04 LAB — SPECIMEN STATUS REPORT

## 2018-04-04 LAB — URINALYSIS, ROUTINE W REFLEX MICROSCOPIC
BILIRUBIN UA: NEGATIVE
Glucose, UA: NEGATIVE
Ketones, UA: NEGATIVE
LEUKOCYTES UA: NEGATIVE
Nitrite, UA: NEGATIVE
Protein, UA: NEGATIVE
RBC UA: NEGATIVE
Specific Gravity, UA: 1.007 (ref 1.005–1.030)
Urobilinogen, Ur: 0.2 mg/dL (ref 0.2–1.0)
pH, UA: 6.5 (ref 5.0–7.5)

## 2018-04-05 ENCOUNTER — Encounter: Payer: Self-pay | Admitting: Family

## 2018-04-05 ENCOUNTER — Ambulatory Visit (INDEPENDENT_AMBULATORY_CARE_PROVIDER_SITE_OTHER): Payer: No Typology Code available for payment source | Admitting: Family

## 2018-04-05 VITALS — BP 123/77 | HR 98 | Temp 99.1°F | Resp 16

## 2018-04-05 DIAGNOSIS — I428 Other cardiomyopathies: Secondary | ICD-10-CM

## 2018-04-05 DIAGNOSIS — A4151 Sepsis due to Escherichia coli [E. coli]: Secondary | ICD-10-CM

## 2018-04-05 DIAGNOSIS — F419 Anxiety disorder, unspecified: Secondary | ICD-10-CM | POA: Diagnosis not present

## 2018-04-05 DIAGNOSIS — N12 Tubulo-interstitial nephritis, not specified as acute or chronic: Secondary | ICD-10-CM | POA: Diagnosis not present

## 2018-04-05 MED ORDER — OXYCODONE-ACETAMINOPHEN 5-325 MG PO TABS: 1.0000 | ORAL_TABLET | Freq: Four times a day (QID) | ORAL | 0 refills | Status: DC | PRN

## 2018-04-05 MED ORDER — PSYLLIUM 58.6 % PO POWD: 1.0000 | Freq: Three times a day (TID) | ORAL | 12 refills | Status: DC

## 2018-04-05 NOTE — Progress Notes (Signed)
Pt made aware of negative urine culture and UA. She reports she saw MyChart results yesterday. UA now resulted, negative. Pt reports she has her pcp f/u this afternoon.
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
## 2018-04-05 NOTE — Patient Instructions (Addendum)
Please complete lab work prior to leaving. Please go to the ER if you develop worsening pain, fever >101.

## 2018-04-06 ENCOUNTER — Encounter: Payer: Self-pay | Admitting: Family

## 2018-04-06 ENCOUNTER — Telehealth: Payer: Self-pay | Admitting: Family

## 2018-04-06 LAB — CBC WITH DIFFERENTIAL/PLATELET
Basophils Absolute: 0.1 10*3/uL (ref 0.0–0.1)
Basophils Relative: 1.5 % (ref 0.0–3.0)
Eosinophils Absolute: 0 10*3/uL (ref 0.0–0.7)
Eosinophils Relative: 0.6 % (ref 0.0–5.0)
HCT: 39.8 % (ref 36.0–46.0)
Hemoglobin: 13.4 g/dL (ref 12.0–15.0)
Lymphocytes Relative: 29.3 % (ref 12.0–46.0)
Lymphs Abs: 2.2 10*3/uL (ref 0.7–4.0)
MCHC: 33.8 g/dL (ref 30.0–36.0)
MCV: 88.8 fl (ref 78.0–100.0)
MONO ABS: 0.4 10*3/uL (ref 0.1–1.0)
Monocytes Relative: 6 % (ref 3.0–12.0)
Neutro Abs: 4.7 10*3/uL (ref 1.4–7.7)
Neutrophils Relative %: 62.6 % (ref 43.0–77.0)
Platelets: 620 10*3/uL — ABNORMAL HIGH (ref 150.0–400.0)
RBC: 4.48 Mil/uL (ref 3.87–5.11)
RDW: 13.3 % (ref 11.5–15.5)
WBC: 7.5 10*3/uL (ref 4.0–10.5)

## 2018-04-06 LAB — COMPREHENSIVE METABOLIC PANEL
ALT: 46 U/L — ABNORMAL HIGH (ref 0–35)
AST: 28 U/L (ref 0–37)
Albumin: 4.7 g/dL (ref 3.5–5.2)
Alkaline Phosphatase: 101 U/L (ref 39–117)
BUN: 7 mg/dL (ref 6–23)
CO2: 29 mEq/L (ref 19–32)
Calcium: 10.3 mg/dL (ref 8.4–10.5)
Chloride: 101 mEq/L (ref 96–112)
Creatinine, Ser: 0.64 mg/dL (ref 0.40–1.20)
GFR: 107.02 mL/min (ref >60.00)
Glucose, Bld: 95 mg/dL (ref 70–99)
POTASSIUM: 4 meq/L (ref 3.5–5.1)
Sodium: 140 mEq/L (ref 135–145)
TOTAL PROTEIN: 7.6 g/dL (ref 6.0–8.3)
Total Bilirubin: 0.7 mg/dL (ref 0.2–1.2)

## 2018-04-06 NOTE — Progress Notes (Signed)
noted 

## 2018-04-06 NOTE — Telephone Encounter (Signed)
Copied from CRM [id number]. Topic: Quick Communication - See Telephone Encounter >> Apr 06, 2018  5:30 PM Lorrine Kin, [place] wrote: CRM for notification. See Telephone encounter for: 04/06/18.  Please see MyChart message from 04/06/2018.  Patient calling and states that she is unable to see the new letter form 04/06/2018 in her MyChart. States that she is only able to pull up the old one from 04/05/2018. Please advise.

## 2018-04-06 NOTE — Telephone Encounter (Deleted)
Copied from CRM [id number]. Topic: Quick Communication - See Telephone Encounter >> Apr 06, 2018  5:30 PM Lorrine Kin, [place] wrote: CRM for notification. See Telephone encounter for: 04/06/18. Patient calling and states that she is unable to see the new letter form 04/06/2018 in her MyChart. States that she is only able to pull up the old one from 04/05/2018. Please advise.

## 2018-04-07 ENCOUNTER — Encounter: Payer: Self-pay | Admitting: Family

## 2018-04-07 NOTE — Telephone Encounter (Signed)
Letter faxed to patient's employer at [phone]

## 2018-04-08 ENCOUNTER — Encounter (HOSPITAL_BASED_OUTPATIENT_CLINIC_OR_DEPARTMENT_OTHER): Payer: Self-pay

## 2018-04-09 ENCOUNTER — Encounter (HOSPITAL_BASED_OUTPATIENT_CLINIC_OR_DEPARTMENT_OTHER): Payer: Self-pay

## 2018-04-09 ENCOUNTER — Other Ambulatory Visit: Payer: Self-pay

## 2018-04-09 NOTE — Progress Notes (Signed)
Spoke with:  Stephanie Patrick NPO:  After Midnight, no gum, candy, or mints   Arrival time: 0530AM Labs: EKG, CBC/diff, CMP in epic AM medications:  LORAZEPM IF NEEDED Pre op orders: Yes Ride home:  Stephanie Patrick (mom) [phone]

## 2018-04-14 NOTE — H&P (Signed)
Office Visit Report     04/08/2018   --------------------------------------------------------------------------------   Stephanie Patrick  MRN: [id number]  PRIMARY CARE:  Macario Carls  DOB: January 29, 1986, 33 year old Female  REFERRING:    SSN:   PROVIDER:  Jerilee Field, M.D.    TREATING:  Lupe Carney.    LOCATION:  [facility] [phone]   --------------------------------------------------------------------------------   CC: I have ureteral stone.  HPI: Stephanie Patrick is a 33 year-old female patient who is here for ureteral stone.  This patient is a 33 year old female who presented to the emergency room on 03/18/2018, with a temperature of 105 F, and tachycardia. Her urine also showed many bacteria. CT scan revealed a 5 mm distal left ureteral stone with mild-to-moderate hydronephrosis. A Foley was also placed and 500 cc of urine drained. BUN/Cr = 14/0.93, GFR = >60. She underwent cystoscopy, left retrograde pyelogram, and left ureteral stent placement on 03/19/2018.   Today, she c/o left flank pain, bladder pressure, urinary frequency and nausea. She is currently taking Percocet qhs and Tylenol and Azo during the day, which is not helpful. She is taking Zofran for nausea, and nitrofurantoin. She is also very upset secondary to physical discomfort and also possibly losing her new job secondary to recent hospitalization.   She is scheduled for ureteroscopy, laser lithotripsy and stent exchange on 04/15/18.   The problem is on the left side. She is currently having flank pain and nausea. She denies having back pain, groin pain, vomiting, fever, and chills.     ALLERGIES: None   MEDICATIONS: Percocet  Ativan  Nitrofurantoin 100 mg capsule 1 capsule PO Q HS     GU PSH: Cystoscopy Insert Stent, Left - 03/19/2018    NON-GU PSH: None   GU PMH: None   NON-GU PMH: Anxiety    FAMILY HISTORY: None   SOCIAL HISTORY: Marital Status: Single Preferred  Language: English; Ethnicity: Not Hispanic Or Latino; Race: White Current Smoking Status: Patient has never smoked.   Tobacco Use Assessment Completed: Used Tobacco in last 30 days? Social Drinker.  Drinks 1 caffeinated drink per day.    REVIEW OF SYSTEMS:    GU Review Female:   Patient reports frequent urination, get up at night to urinate, trouble starting your stream, and have to strain to urinate. Patient denies hard to postpone urination, burning /pain with urination, leakage of urine, stream starts and stops, and being pregnant.  Gastrointestinal (Upper):   Patient denies nausea, vomiting, and indigestion/ heartburn.  Gastrointestinal (Lower):   Patient denies diarrhea and constipation.  Constitutional:   Patient denies fever, night sweats, weight loss, and fatigue.  Skin:   Patient denies skin rash/ lesion and itching.  Eyes:   Patient denies blurred vision and double vision.  Ears/ Nose/ Throat:   Patient denies sinus problems and sore throat.  Hematologic/Lymphatic:   Patient denies swollen glands and easy bruising.  Cardiovascular:   Patient denies leg swelling and chest pains.  Respiratory:   Patient denies cough and shortness of breath.  Endocrine:   Patient denies excessive thirst.  Musculoskeletal:   Patient denies back pain and joint pain.  Neurological:   Patient denies headaches and dizziness.  Psychologic:   Patient denies depression and anxiety.   VITAL SIGNS:      04/08/2018 09:24 AM  Weight 185 lb / 83.91 kg  Height 64 in / 162.56 cm  BP 109/74 mmHg  Pulse 101 /min  Temperature 98.0 F / 36.6  C  BMI 31.8 kg/m   MULTI-SYSTEM PHYSICAL EXAMINATION:    Constitutional: Well-nourished. No physical deformities. Normally developed. Good grooming.  Neck: Neck symmetrical, not swollen. Normal tracheal position.  Respiratory: No labored breathing, no use of accessory muscles.   Neurologic / Psychiatric: Patient depressed. Oriented to time, oriented to place, oriented to  person. No anxiety, no agitation. Very upset secondary to possibly losing new job.  Gastrointestinal: No mass, no tenderness, no rigidity, non obese abdomen. +Tenderness to palpation of left abdominal quadrants.  Musculoskeletal: Normal gait and station of head and neck.     PAST DATA REVIEWED:  Source Of History:  Patient  Lab Test Review:   BMP  Records Review:   Previous Hospital Records   PROCEDURES:         KUB - 00762  A single view of the abdomen is obtained. Left JJ stent in proper position. 5 mm distal left ureteral stone is apparent.              PVR Ultrasound - 26333  Scanned Volume: 71 cc         Urinalysis w/Scope Dipstick Dipstick Cont'd Micro  Color: Amber Bilirubin: Neg mg/dL WBC/hpf: 0 - 5/hpf  Appearance: Cloudy Ketones: Neg mg/dL RBC/hpf: 3 - 54/TGY  Specific Gravity: 1.020 Blood: 2+ ery/uL Bacteria: Few (10-25/hpf)  pH: 6.0 Protein: Trace mg/dL Cystals: NS (Not Seen)  Glucose: Neg mg/dL Urobilinogen: 1.0 mg/dL Casts: NS (Not Seen)    Nitrites: Positive Trichomonas: Not Present    Leukocyte Esterase: Trace leu/uL Mucous: Present      Epithelial Cells: 0 - 5/hpf      Yeast: NS (Not Seen)      Sperm: Not Present         Ketoralac 60mg  - [id number], [id number] Qty: 60 Adm. By: Andree Moro  Unit: mg Lot No [id number]  Route: IM Exp. Date 11/02/2018  Freq: None Mfgr.:   Site: Left Hip        Notes:   INITIALLY UNABLE TO VOID.   ASSESSMENT:      ICD-10 Details  1 GU:   Ureteral calculus - N20.1   3   Hydronephrosis - N13.0   4   Ureteral obstruction secondary to calculous - N13.2   5   Acute Cystitis/UTI - N30.00   2 NON-GU:   Anxiety - F41.9    PLAN:            Medications New Meds: Ketorolac Tromethamine 10 mg tablet 1 tablet PO Q 6 H PRN Do NOT BEGIN UNTIL TOMORROW, 04/09/18  #16  0 Refill(s)            Orders Labs Urine Preg., Urine Culture  X-Rays: KUB          Schedule Return Visit/Planned Activity: Keep Scheduled Appointment           Document Letter(s):  Created for Patient: Clinical Summary         Notes:   This patient's pain is well controlled with Toradol IM injection in the office today, and she tolerated this well. KUB indicates her left double-J stent is in proper position. This was reviewed with Dr. Mena Goes. She is scheduled for ureteroscopy and laser lithotripsy on 04/15/18. I will order a urine culture today. I emphasized the importance of continuing to take previously prescribed nitrofurantoin. Will also prescribe Toradol today. Script sent. She was advised to not be in taking it until tomorrow.   CC: Dr. Sandford Craze  Next Appointment:      Next Appointment: 04/15/2018 07:30 AM    Appointment Type: Surgery     Location: [facility] [phone]    Provider: Jerilee Field, M.D.    Reason for Visit: NE/OP CYSTO, LEFT URS, LL, STENT EXCHANGE      * Signed by Donnalee Curry, P.A. on 04/08/18 at 12:27 PM (EST)*  Addendum: urine cx negative. I sent nightly NF. I sent tamsulosin for stent pain.      The information contained in this medical record document is considered private and confidential patient information. This information can only be used for the medical diagnosis and/or medical services that are being provided by the patient's selected caregivers. This information can only be distributed outside of the patient's care if the patient agrees and signs waivers of authorization for this information to be sent to an outside source or route.

## 2018-04-15 ENCOUNTER — Encounter (HOSPITAL_BASED_OUTPATIENT_CLINIC_OR_DEPARTMENT_OTHER): Payer: Self-pay | Admitting: Emergency Medicine

## 2018-04-15 ENCOUNTER — Other Ambulatory Visit: Payer: Self-pay

## 2018-04-15 ENCOUNTER — Ambulatory Visit (HOSPITAL_BASED_OUTPATIENT_CLINIC_OR_DEPARTMENT_OTHER)
Admission: RE | Admit: 2018-04-15 | Discharge: 2018-04-15 | Disposition: A | Discharge status: Home / Self Care | Payer: No Typology Code available for payment source | Source: Ambulatory Visit | Attending: Urology | Admitting: Urology

## 2018-04-15 ENCOUNTER — Encounter (HOSPITAL_BASED_OUTPATIENT_CLINIC_OR_DEPARTMENT_OTHER)
Admission: RE | Disposition: A | Discharge status: Home / Self Care | Payer: Self-pay | Source: Ambulatory Visit | Attending: Urology

## 2018-04-15 ENCOUNTER — Ambulatory Visit (HOSPITAL_BASED_OUTPATIENT_CLINIC_OR_DEPARTMENT_OTHER): Payer: No Typology Code available for payment source | Admitting: Anesthesiology

## 2018-04-15 ENCOUNTER — Ambulatory Visit: Payer: Self-pay | Admitting: Physician Assistant

## 2018-04-15 DIAGNOSIS — N132 Hydronephrosis with renal and ureteral calculous obstruction: Secondary | ICD-10-CM | POA: Diagnosis not present

## 2018-04-15 DIAGNOSIS — N201 Calculus of ureter: Secondary | ICD-10-CM

## 2018-04-15 DIAGNOSIS — I509 Heart failure, unspecified: Secondary | ICD-10-CM | POA: Insufficient documentation

## 2018-04-15 DIAGNOSIS — F419 Anxiety disorder, unspecified: Secondary | ICD-10-CM | POA: Diagnosis not present

## 2018-04-15 DIAGNOSIS — Z79899 Other long term (current) drug therapy: Secondary | ICD-10-CM | POA: Diagnosis not present

## 2018-04-15 HISTORY — DX: Anxiety disorder, unspecified: F41.9

## 2018-04-15 HISTORY — DX: Pneumonia, unspecified organism: J18.9

## 2018-04-15 HISTORY — DX: Other ill-defined heart diseases: I51.89

## 2018-04-15 HISTORY — DX: Acute respiratory failure, unspecified whether with hypoxia or hypercapnia: J96.00

## 2018-04-15 HISTORY — DX: Unspecified hydronephrosis: N13.30

## 2018-04-15 HISTORY — DX: Acute pyelonephritis: N10

## 2018-04-15 HISTORY — DX: Cardiomyopathy, unspecified: I42.9

## 2018-04-15 HISTORY — PX: CYSTOSCOPY/URETEROSCOPY/HOLMIUM LASER/STENT PLACEMENT: SHX6546

## 2018-04-15 HISTORY — DX: Other complications of anesthesia, initial encounter: T88.59XA

## 2018-04-15 HISTORY — DX: Gram-negative sepsis, unspecified: A41.50

## 2018-04-15 HISTORY — DX: Sepsis due to Escherichia coli (e. coli): A41.51

## 2018-04-15 HISTORY — DX: Personal history of urinary calculi: Z87.442

## 2018-04-15 HISTORY — DX: Adverse effect of unspecified anesthetic, initial encounter: T41.45XA

## 2018-04-15 HISTORY — DX: Heart disease, unspecified: I51.9

## 2018-04-15 HISTORY — DX: Severe sepsis with septic shock: R65.21

## 2018-04-15 HISTORY — DX: Chronic pulmonary edema: J81.1

## 2018-04-15 HISTORY — DX: Other specified postprocedural states: Z98.890

## 2018-04-15 HISTORY — DX: Nausea with vomiting, unspecified: R11.2

## 2018-04-15 SURGERY — CYSTOSCOPY/URETEROSCOPY/HOLMIUM LASER/STENT PLACEMENT
Anesthesia: General | Site: Ureter | Laterality: Left

## 2018-04-15 MED ORDER — CEFAZOLIN SODIUM-DEXTROSE 2-4 GM/100ML-% IV SOLN
INTRAVENOUS | Status: AC
  Filled 2018-04-15: qty 100

## 2018-04-15 MED ORDER — FENTANYL CITRATE (PF) 100 MCG/2ML IJ SOLN
INTRAMUSCULAR | Status: DC | PRN
  Administered 2018-04-15 (×2): 50 ug via INTRAVENOUS

## 2018-04-15 MED ORDER — SODIUM CHLORIDE 0.9 % IR SOLN
Status: DC | PRN
  Administered 2018-04-15: 1 via INTRAVESICAL

## 2018-04-15 MED ORDER — MIDAZOLAM HCL 2 MG/2ML IJ SOLN
INTRAMUSCULAR | Status: AC
  Filled 2018-04-15: qty 2

## 2018-04-15 MED ORDER — MEPERIDINE HCL 25 MG/ML IJ SOLN
6.2500 mg | INTRAMUSCULAR | Status: DC | PRN
  Filled 2018-04-15: qty 1

## 2018-04-15 MED ORDER — ACETAMINOPHEN 10 MG/ML IV SOLN
1000.0000 mg | Freq: Once | INTRAVENOUS | Status: DC | PRN
  Filled 2018-04-15: qty 100

## 2018-04-15 MED ORDER — FENTANYL CITRATE (PF) 100 MCG/2ML IJ SOLN
INTRAMUSCULAR | Status: AC
  Filled 2018-04-15: qty 2

## 2018-04-15 MED ORDER — MIDAZOLAM HCL 2 MG/2ML IJ SOLN
INTRAMUSCULAR | Status: DC | PRN
  Administered 2018-04-15: 2 mg via INTRAVENOUS

## 2018-04-15 MED ORDER — CEFAZOLIN SODIUM-DEXTROSE 2-4 GM/100ML-% IV SOLN
2.0000 g | Freq: Once | INTRAVENOUS | Status: AC
  Administered 2018-04-15: 2 g via INTRAVENOUS
  Filled 2018-04-15: qty 100

## 2018-04-15 MED ORDER — FENTANYL CITRATE (PF) 100 MCG/2ML IJ SOLN
25.0000 ug | INTRAMUSCULAR | Status: DC | PRN
  Filled 2018-04-15: qty 1

## 2018-04-15 MED ORDER — PROMETHAZINE HCL 25 MG/ML IJ SOLN
6.2500 mg | INTRAMUSCULAR | Status: DC | PRN
  Filled 2018-04-15: qty 1

## 2018-04-15 MED ORDER — ONDANSETRON HCL 4 MG/2ML IJ SOLN
INTRAMUSCULAR | Status: DC | PRN
  Administered 2018-04-15: 4 mg via INTRAVENOUS

## 2018-04-15 MED ORDER — KETOROLAC TROMETHAMINE 30 MG/ML IJ SOLN
INTRAMUSCULAR | Status: DC | PRN
  Administered 2018-04-15: 30 mg via INTRAVENOUS

## 2018-04-15 MED ORDER — LIDOCAINE 2% (20 MG/ML) 5 ML SYRINGE
INTRAMUSCULAR | Status: DC | PRN
  Administered 2018-04-15: 80 mg via INTRAVENOUS

## 2018-04-15 MED ORDER — ACETAMINOPHEN 325 MG PO TABS
325.0000 mg | ORAL_TABLET | Freq: Once | ORAL | Status: DC
  Filled 2018-04-15: qty 2

## 2018-04-15 MED ORDER — LIDOCAINE 2% (20 MG/ML) 5 ML SYRINGE
INTRAMUSCULAR | Status: AC
  Filled 2018-04-15: qty 5

## 2018-04-15 MED ORDER — KETOROLAC TROMETHAMINE 30 MG/ML IJ SOLN
INTRAMUSCULAR | Status: AC
  Filled 2018-04-15: qty 1

## 2018-04-15 MED ORDER — DEXAMETHASONE SODIUM PHOSPHATE 10 MG/ML IJ SOLN
INTRAMUSCULAR | Status: DC | PRN
  Administered 2018-04-15: 10 mg via INTRAVENOUS

## 2018-04-15 MED ORDER — PROPOFOL 10 MG/ML IV BOLUS
INTRAVENOUS | Status: AC
  Filled 2018-04-15: qty 20

## 2018-04-15 MED ORDER — LACTATED RINGERS IV SOLN
INTRAVENOUS | Status: DC
  Administered 2018-04-15: 07:00:00 via INTRAVENOUS
  Filled 2018-04-15: qty 1000

## 2018-04-15 MED ORDER — DEXAMETHASONE SODIUM PHOSPHATE 10 MG/ML IJ SOLN
INTRAMUSCULAR | Status: AC
  Filled 2018-04-15: qty 1

## 2018-04-15 MED ORDER — LACTATED RINGERS IV SOLN
INTRAVENOUS | Status: DC
  Filled 2018-04-15: qty 1000

## 2018-04-15 MED ORDER — PROPOFOL 10 MG/ML IV BOLUS
INTRAVENOUS | Status: DC | PRN
  Administered 2018-04-15: 180 mg via INTRAVENOUS

## 2018-04-15 MED ORDER — ONDANSETRON HCL 4 MG/2ML IJ SOLN
INTRAMUSCULAR | Status: AC
  Filled 2018-04-15: qty 2

## 2018-04-15 MED ORDER — ACETAMINOPHEN 160 MG/5ML PO SOLN
325.0000 mg | Freq: Once | ORAL | Status: DC
  Filled 2018-04-15: qty 20.3

## 2018-04-15 SURGICAL SUPPLY — 23 items
BAG DRAIN URO-CYSTO SKYTR STRL (DRAIN) ×2 IMPLANT
BASKET LASER NITINOL 1.9FR (BASKET) IMPLANT
BASKET ZERO TIP NITINOL 2.4FR (BASKET) ×2 IMPLANT
CATH URET 5FR 28IN CONE TIP (BALLOONS)
CATH URET 5FR 28IN OPEN ENDED (CATHETERS) ×2 IMPLANT
CATH URET 5FR 70CM CONE TIP (BALLOONS) IMPLANT
CATH URET DUAL LUMEN 6-10FR 50 (CATHETERS) IMPLANT
CLOTH BEACON ORANGE TIMEOUT ST (SAFETY) ×2 IMPLANT
FIBER LASER FLEXIVA 365 (UROLOGICAL SUPPLIES) IMPLANT
FIBER LASER TRAC TIP (UROLOGICAL SUPPLIES) IMPLANT
GLOVE BIO SURGEON STRL SZ7.5 (GLOVE) ×2 IMPLANT
GOWN STRL REUS W/TWL LRG LVL3 (GOWN DISPOSABLE) ×2 IMPLANT
GOWN STRL REUS W/TWL XL LVL3 (GOWN DISPOSABLE) IMPLANT
GUIDEWIRE ANG ZIPWIRE 038X150 (WIRE) ×2 IMPLANT
GUIDEWIRE STR DUAL SENSOR (WIRE) ×2 IMPLANT
INFUSOR MANOMETER BAG 3000ML (MISCELLANEOUS) ×2 IMPLANT
IV NS IRRIG 3000ML ARTHROMATIC (IV SOLUTION) ×4 IMPLANT
KIT TURNOVER CYSTO (KITS) ×2 IMPLANT
MANIFOLD NEPTUNE II (INSTRUMENTS) ×2 IMPLANT
NS IRRIG 500ML POUR BTL (IV SOLUTION) ×2 IMPLANT
PACK CYSTO (CUSTOM PROCEDURE TRAY) ×2 IMPLANT
TUBE CONNECTING 12X1/4 (SUCTIONS) ×2 IMPLANT
TUBING UROLOGY SET (TUBING) ×2 IMPLANT

## 2018-04-15 NOTE — Op Note (Signed)
Preoperative diagnosis: Left ureteral stone Postoperative diagnosis: Same  Procedure: Cystoscopy, left ureteroscopy with stone basket extraction and left ureteral stent removal  Surgeon: Mena Goes  Anesthesia: General  Indication for procedure: Stephanie Patrick is a 33 year old female who developed sepsis while passing a 4 mm stone.  She underwent an urgent stent for a 4 mm left distal stone.  She returns today for definitive stone management.  Findings: Minimal reaction to the stent in the bladder.  Stent was clean.  On ureteroscopy the stone was noted in the distal left ureter with minimal reaction of the ureter to the stone and the stent with minimal to no erythema or edema.  However, the ureter was nicely relaxed and dilated.  The 4.5 [place] single channel ureteroscope was used to remove the stone with a basket without difficulty and therefore repeat stenting was not felt to be necessary and the stent was not replaced.  Description of procedure: After consent was obtained patient brought to the operating room.  After adequate anesthesia she is placed in lithotomy position prepped and draped in usual sterile fashion.  Timeout was performed to confirm the patient and procedure.  Cystoscope was passed per urethra and the bladder was irrigated several times.  The stent was then grasped and removed through the urethral meatus and a sensor wire advanced.  Adjacent to the wire the 4.5 [place] single channel semirigid ureteroscope was advanced and the stone was encountered.  It was encircled with a 0 tip basket and removed without resistance.  The stone was given to the patient.  Repeat ureteroscopy up to the iliac vessels noted again the ureter to be nicely dilated with minimal to no edema or erythema and it was not felt she needed repeat stenting.  The semirigid ureteroscope was backed out and the sensor wire was removed.  Repeat cystoscopy revealed there to be bilateral ureteral efflux that was clear.  The bladder  was drained and the scope removed and she was taken to the recovery room in stable condition.  Complications: None  Blood loss: Minimal  Specimens: Stone to patient/office lab  Drains: None  Disposition: Patient stable to PACU

## 2018-04-15 NOTE — Anesthesia Procedure Notes (Signed)
Procedure Name: LMA Insertion Date/Time: 04/15/2018 7:53 AM Performed by: Francie Massing, CRNA Pre-anesthesia Checklist: Patient identified, Emergency Drugs available, Suction available and Patient being monitored Patient Re-evaluated:Patient Re-evaluated prior to induction Oxygen Delivery Method: Circle system utilized Preoxygenation: Pre-oxygenation with 100% oxygen Induction Type: IV induction Ventilation: Mask ventilation without difficulty LMA: LMA inserted LMA Size: 4.0 Number of attempts: 1 Airway Equipment and Method: Bite block Placement Confirmation: positive ETCO2 Tube secured with: Tape Dental Injury: Teeth and Oropharynx as per pre-operative assessment

## 2018-04-15 NOTE — Discharge Instructions (Signed)
Ureteroscopy, Care After This sheet gives you information about how to care for yourself after your procedure. Your health care provider may also give you more specific instructions. If you have problems or questions, contact your health care provider. What can I expect after the procedure? After the procedure, it is common to have:  A burning sensation when you urinate.  Blood in your urine.  Mild discomfort in the bladder area or kidney area when urinating.  Needing to urinate more often or urgently. Follow these instructions at home:  Medicines  Take over-the-counter and prescription medicines only as told by your health care provider.  If you were prescribed an antibiotic medicine, take it as told by your health care provider. Do not stop taking the antibiotic even if you start to feel better. General instructions  Donot drive for 24 hours if you were given a medicine to help you relax (sedative) during your procedure.  To relieve burning, try taking a warm bath or holding a warm washcloth over your groin.  Drink enough fluid to keep your urine clear or pale yellow. ? Drink one 8-ounce glasses of water every hour for the first 2 hours after you get home. ? Continue to drink water often at home.  You can eat what you usually do.  Keep all follow-up visits as told by your health care provider. This is important. ? You do not have a tube to keep urine flowing (ureteral stent), but you have follow-up to check the kidney in a few weeks.  Contact a health care provider if:  You have chills or a fever.  You have burning pain for longer than 24 hours after the procedure.  You have blood in your urine for longer than 24 hours after the procedure. Get help right away if:  You have large amounts of blood in your urine.  You have blood clots in your urine.  You have very bad pain.  You have chest pain or trouble breathing.  You are unable to urinate and you have the feeling  of a full bladder. This information is not intended to replace advice given to you by your health care provider. Make sure you discuss any questions you have with your health care provider. Document Released: 02/22/2013 Document Revised: 12/04/2015 Document Reviewed: 11/30/2015 Elsevier Interactive Patient Education  2019 [employer].  Post Anesthesia Home Care Instructions  Activity: Get plenty of rest for the remainder of the day. A responsible individual must stay with you for 24 hours following the procedure.  For the next 24 hours, DO NOT: -Drive a car -Advertising copywriter -Drink alcoholic beverages -Take any medication unless instructed by your physician -Make any legal decisions or sign important papers.  Meals: Start with liquid foods such as gelatin or soup. Progress to regular foods as tolerated. Avoid greasy, spicy, heavy foods. If nausea and/or vomiting occur, drink only clear liquids until the nausea and/or vomiting subsides. Call your physician if vomiting continues.  Special Instructions/Symptoms: Your throat may feel dry or sore from the anesthesia or the breathing tube placed in your throat during surgery. If this causes discomfort, gargle with warm salt water. The discomfort should disappear within 24 hours.  If you had a scopolamine patch placed behind your ear for the management of post- operative nausea and/or vomiting:  1. The medication in the patch is effective for 72 hours, after which it should be removed.  Wrap patch in a tissue and discard in the trash. Wash hands thoroughly  with soap and water. 2. You may remove the patch earlier than 72 hours if you experience unpleasant side effects which may include dry mouth, dizziness or visual disturbances. 3. Avoid touching the patch. Wash your hands with soap and water after contact with the patch.    Ibuprofen may be taken after 2:15 pm if needed

## 2018-04-15 NOTE — Anesthesia Preprocedure Evaluation (Addendum)
Anesthesia Evaluation  Patient identified by MRN, date of birth, ID band Patient awake    Reviewed: Allergy & Precautions, NPO status , Patient's Chart, lab work & pertinent test results  History of Anesthesia Complications (+) PONV  Airway Mallampati: I  TM Distance: >3 FB Neck ROM: Full    Dental  (+) Teeth Intact, Dental Advisory Given   Pulmonary    breath sounds clear to auscultation       Cardiovascular +CHF   Rhythm:Regular Rate:Normal     Neuro/Psych  Headaches, Anxiety Depression    GI/Hepatic negative GI ROS, Neg liver ROS,   Endo/Other  negative endocrine ROS  Renal/GU      Musculoskeletal negative musculoskeletal ROS (+)   Abdominal Normal abdominal exam  (+)   Peds  Hematology negative hematology ROS (+)   Anesthesia Other Findings   Reproductive/Obstetrics                            Lab Results  Component Value Date   WBC 7.5 04/05/2018   HGB 13.4 04/05/2018   HCT 39.8 04/05/2018   MCV 88.8 04/05/2018   PLT 620.0 (H) 04/05/2018   Lab Results  Component Value Date   CREATININE 0.64 04/05/2018   BUN 7 04/05/2018   NA 140 04/05/2018   K 4.0 04/05/2018   CL 101 04/05/2018   CO2 29 04/05/2018   Lab Results  Component Value Date   INR 1.59 03/19/2018   Echo: - Left ventricle: The cavity size was normal. Systolic function was   normal. The estimated ejection fraction was in the range of 50%   to 55%. Wall motion was normal; there were no regional wall   motion abnormalities. The study is not technically sufficient to   allow evaluation of LV diastolic function. - Pericardium, extracardiac: A trivial pericardial effusion was   identified posterior to the heart. Features were not consistent   with tamponade physiology.  Anesthesia Physical Anesthesia Plan  ASA: II  Anesthesia Plan: General   Post-op Pain Management:    Induction: Intravenous  PONV Risk  Score and Plan: 4 or greater and Ondansetron, Dexamethasone, Midazolam and Scopolamine patch - Pre-op  Airway Management Planned: LMA  Additional Equipment: None  Intra-op Plan:   Post-operative Plan: Extubation in OR  Informed Consent: I have reviewed the patients History and Physical, chart, labs and discussed the procedure including the risks, benefits and alternatives for the proposed anesthesia with the patient or authorized representative who has indicated his/her understanding and acceptance.     Dental advisory given  Plan Discussed with: CRNA  Anesthesia Plan Comments:        Anesthesia Quick Evaluation

## 2018-04-15 NOTE — Anesthesia Postprocedure Evaluation (Signed)
Anesthesia Post Note  Patient: Stephanie Patrick  Procedure(s) Performed: CYSTOSCOPY/URETEROSCOPY stent removal and stone extraction (Left Ureter)     Patient location during evaluation: PACU Anesthesia Type: General Level of consciousness: awake and alert Pain management: pain level controlled Vital Signs Assessment: post-procedure vital signs reviewed and stable Respiratory status: spontaneous breathing, nonlabored ventilation, respiratory function stable and patient connected to nasal cannula oxygen Cardiovascular status: blood pressure returned to baseline and stable Postop Assessment: no apparent nausea or vomiting Anesthetic complications: no Comments: To be seen by Social Services prior to discharge    Last Vitals:  Vitals:   04/15/18 0845 04/15/18 0900  BP: 98/61 101/64  Pulse: 90 80  Resp: (!) 22 13  Temp:    SpO2: 97% 97%    Last Pain:  Vitals:   04/15/18 0935  TempSrc:   PainSc: 0-No pain                 Shelton Silvas

## 2018-04-15 NOTE — Progress Notes (Signed)
Received report of need for care management consult.  Order placed. Spoke w Juliette Alcide in care management. Juliette Alcide informed of need for consult asap because pt is ready for d/c to home. Pt informed and verbalized her understanding of d/c delay.
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
## 2018-04-15 NOTE — Interval H&P Note (Signed)
History and Physical Interval Note:  04/15/2018 7:37 AM  Stephanie Patrick  has presented today for surgery, with the diagnosis of LEFT DISTAL URETERAL STONE  The various methods of treatment have been discussed with the patient and family. After consideration of risks, benefits and other options for treatment, the patient has consented to  Procedure(s) with comments: CYSTOSCOPY/URETEROSCOPY/HOLMIUM LASER/STENT PLACEMENT (Left) - ONLY NEEDS 60 MIN as a surgical intervention . She is well today without dysuria or gross hematuria. No fever. Discussed rationale for repeat stent/tether.  The patient's history has been reviewed, patient examined, no change in status, stable for surgery.  I have reviewed the patient's chart and labs.  Questions were answered to the patient's satisfaction.  She elects to proceed.    Jerilee Field

## 2018-04-15 NOTE — Transfer of Care (Signed)
Immediate Anesthesia Transfer of Care Note  Patient: Stephanie Patrick  Procedure(s) Performed: Procedure(s) (LRB): CYSTOSCOPY/URETEROSCOPY/HOLMIUM LASER/STENT PLACEMENT (Left)  Patient Location: PACU  Anesthesia Type: General  Level of Consciousness: awake, oriented, sedated and patient cooperative  Airway & Oxygen Therapy: Patient Spontanous Breathing and Patient connected to face mask oxygen  Post-op Assessment: Report given to PACU RN and Post -op Vital signs reviewed and stable  Post vital signs: Reviewed and stable  Complications: No apparent anesthesia complications Last Vitals:  Vitals Value Taken Time  BP    Temp    Pulse    Resp    SpO2      Last Pain:  Vitals:   04/15/18 0557  TempSrc: Oral  PainSc: 6

## 2018-04-16 ENCOUNTER — Encounter (HOSPITAL_BASED_OUTPATIENT_CLINIC_OR_DEPARTMENT_OTHER): Payer: Self-pay | Admitting: Urology

## 2018-04-18 ENCOUNTER — Emergency Department (HOSPITAL_COMMUNITY): Payer: No Typology Code available for payment source

## 2018-04-18 ENCOUNTER — Other Ambulatory Visit: Payer: Self-pay

## 2018-04-18 ENCOUNTER — Encounter (HOSPITAL_COMMUNITY): Payer: Self-pay | Admitting: Emergency Medicine

## 2018-04-18 ENCOUNTER — Emergency Department (HOSPITAL_COMMUNITY)
Admission: EM | Admit: 2018-04-18 | Discharge: 2018-04-19 | Disposition: A | Discharge status: Home / Self Care | Payer: No Typology Code available for payment source | Attending: Emergency Medicine | Admitting: Emergency Medicine

## 2018-04-18 DIAGNOSIS — M5412 Radiculopathy, cervical region: Secondary | ICD-10-CM | POA: Insufficient documentation

## 2018-04-18 DIAGNOSIS — R202 Paresthesia of skin: Secondary | ICD-10-CM | POA: Insufficient documentation

## 2018-04-18 DIAGNOSIS — Z79899 Other long term (current) drug therapy: Secondary | ICD-10-CM | POA: Diagnosis not present

## 2018-04-18 DIAGNOSIS — I110 Hypertensive heart disease with heart failure: Secondary | ICD-10-CM | POA: Insufficient documentation

## 2018-04-18 DIAGNOSIS — I5030 Unspecified diastolic (congestive) heart failure: Secondary | ICD-10-CM | POA: Diagnosis not present

## 2018-04-18 DIAGNOSIS — R0602 Shortness of breath: Secondary | ICD-10-CM | POA: Diagnosis not present

## 2018-04-18 DIAGNOSIS — R51 Headache: Secondary | ICD-10-CM | POA: Insufficient documentation

## 2018-04-18 DIAGNOSIS — R079 Chest pain, unspecified: Secondary | ICD-10-CM

## 2018-04-18 DIAGNOSIS — R519 Headache, unspecified: Secondary | ICD-10-CM

## 2018-04-18 LAB — BRAIN NATRIURETIC PEPTIDE: B NATRIURETIC PEPTIDE 5: 28.9 pg/mL (ref 0.0–100.0)

## 2018-04-18 LAB — BASIC METABOLIC PANEL
Anion gap: 12 (ref 5–15)
BUN: 13 mg/dL (ref 6–20)
CO2: 20 mmol/L — AB (ref 22–32)
Calcium: 9.4 mg/dL (ref 8.9–10.3)
Chloride: 105 mmol/L (ref 98–111)
Creatinine, Ser: 0.83 mg/dL (ref 0.44–1.00)
GFR calc Af Amer: >60 mL/min (ref >60)
GFR calc non Af Amer: >60 mL/min (ref >60)
Glucose, Bld: 101 mg/dL — ABNORMAL HIGH (ref 70–99)
Potassium: 3.7 mmol/L (ref 3.5–5.1)
Sodium: 137 mmol/L (ref 135–145)

## 2018-04-18 LAB — CBC
HCT: 43.7 % (ref 36.0–46.0)
Hemoglobin: 14.3 g/dL (ref 12.0–15.0)
MCH: 28.7 pg (ref 26.0–34.0)
MCHC: 32.7 g/dL (ref 30.0–36.0)
MCV: 87.8 fL (ref 80.0–100.0)
Platelets: 370 10*3/uL (ref 150–400)
RBC: 4.98 MIL/uL (ref 3.87–5.11)
RDW: 13.1 % (ref 11.5–15.5)
WBC: 10.3 10*3/uL (ref 4.0–10.5)
nRBC: 0 % (ref 0.0–0.2)

## 2018-04-18 LAB — I-STAT BETA HCG BLOOD, ED (NOT ORDERABLE): I-stat hCG, quantitative: <5 m[IU]/mL (ref <5)

## 2018-04-18 LAB — D-DIMER, QUANTITATIVE: D-Dimer, Quant: 0.52 ug/mL-FEU — ABNORMAL HIGH (ref 0.00–0.50)

## 2018-04-18 LAB — TROPONIN I: Troponin I: <0.03 ng/mL (ref <0.03)

## 2018-04-18 MED ORDER — SODIUM CHLORIDE 0.9 % IV BOLUS
500.0000 mL | Freq: Once | INTRAVENOUS | Status: AC
  Administered 2018-04-18: 500 mL via INTRAVENOUS

## 2018-04-18 MED ORDER — IOPAMIDOL (ISOVUE-370) INJECTION 76%
100.0000 mL | Freq: Once | INTRAVENOUS | Status: AC | PRN
  Administered 2018-04-18: 100 mL via INTRAVENOUS

## 2018-04-18 MED ORDER — FENTANYL CITRATE (PF) 100 MCG/2ML IJ SOLN
50.0000 ug | Freq: Once | INTRAMUSCULAR | Status: AC
  Administered 2018-04-19: 50 ug via INTRAVENOUS
  Filled 2018-04-18: qty 2

## 2018-04-18 MED ORDER — FENTANYL CITRATE (PF) 100 MCG/2ML IJ SOLN
50.0000 ug | Freq: Once | INTRAMUSCULAR | Status: AC
  Administered 2018-04-18: 50 ug via INTRAVENOUS
  Filled 2018-04-18: qty 2

## 2018-04-18 MED ORDER — SODIUM CHLORIDE 0.9% FLUSH
3.0000 mL | Freq: Once | INTRAVENOUS | Status: AC
  Administered 2018-04-18: 3 mL via INTRAVENOUS

## 2018-04-18 MED ORDER — LORAZEPAM 2 MG/ML IJ SOLN
1.0000 mg | Freq: Once | INTRAMUSCULAR | Status: AC
  Administered 2018-04-18: 1 mg via INTRAVENOUS
  Filled 2018-04-18: qty 1

## 2018-04-18 MED ORDER — ALBUTEROL SULFATE (2.5 MG/3ML) 0.083% IN NEBU
5.0000 mg | INHALATION_SOLUTION | Freq: Once | RESPIRATORY_TRACT | Status: AC
  Administered 2018-04-18: 5 mg via RESPIRATORY_TRACT
  Filled 2018-04-18: qty 6

## 2018-04-18 MED ORDER — IOPAMIDOL (ISOVUE-370) INJECTION 76%
INTRAVENOUS | Status: AC
  Filled 2018-04-18: qty 100

## 2018-04-18 MED ORDER — SODIUM CHLORIDE (PF) 0.9 % IJ SOLN
INTRAMUSCULAR | Status: AC
  Filled 2018-04-18: qty 50

## 2018-04-18 NOTE — ED Notes (Signed)
Returned from CT.

## 2018-04-18 NOTE — ED Provider Notes (Signed)
[facility]-EMERGENCY DEPT Provider Note   CSN: [id number] Arrival date & time: 04/18/18  1939     History   Chief Complaint Chief Complaint  Patient presents with  . Shortness of Breath  . Chest Pain    HPI Stephanie Patrick is a 33 y.o. female.  Stephanie Patrick is a 33 y.o. female with history of severe anxiety, cardiomyopathy, diastolic heart failure, and recent admission for gram-negative sepsis requiring intubation and pressors after kidney stone, who presents for evaluation of headache, chest pain and shortness of breath.  Patient reports that she had her ureteral stent removed by Dr. Mena Goes on Thursday 2/13 out complications.  Friday she developed a generalized headache, that was gradual in onset and has persisted since then.  She reports that this morning she woke up and noticed some left-sided chest pain which she describes as sharp with associated shortness of breath.  She reports chest pain has persisted throughout the day she continues to feel short of breath and occasionally lightheaded.  She reports she frequently has mild chest pains associated with her anxiety but this feels different than usual her chest pains are not usually localized to the left side of her chest.  She reports around 4 PM this evening she also noticed some tingling sensations and numbness in her left hand and arm.  She is continued to have headache throughout the day.  Denies associated vision changes, facial asymmetry, changes in speech, no numbness weakness or tingling in any other extremities.  She has taken Tylenol for her headache but has not tried anything else to treat the symptoms, reports she frequently uses Ativan at home for her anxiety but has not taken any today.  Denies any other aggravating relieving factors.  Prior to recent hospitalization she had no history of heart problems.  Her EF improved during her hospitalization and she has upcoming outpatient follow-up for repeat  echo with cardiology.     Past Medical History:  Diagnosis Date  . Acute pyelonephritis 03/2018  . Acute respiratory failure (HCC) 03/25/2018   Ventilatory  . ADHD 02/13/2017  . Anxiety   . Cardiomyopathy (HCC) 03/2018   03/18/2018 EF 30-35% improved to 50-55% on 03/25/2018  . Chicken pox   . Complication of anesthesia    Woke up after surgery, felt paralyzed but unable to speak, made very scared  . Depression   . E. coli sepsis (HCC) 03/2018  . Grade II diastolic dysfunction    noted on ECHO 03/19/2018 had improved on ECHO 03/25/2018  . Gram negative septic shock (HCC) 03/2018  . History of kidney stones 03/18/2018   4 mm stone within the distal left ureter  . Hydronephrosis 03/19/2018   Left hydronephrosis mild to moderate left hydroureteronephrosis  . Migraines   . Pneumonia   . PONV (postoperative nausea and vomiting)   . Pulmonary edema 03/20/2018   Noted on CXR  . Urinary tract infection     Patient Active Problem List   Diagnosis Date Noted  . Acute systolic CHF (congestive heart failure) (HCC)   . Acute respiratory failure with hypoxia and hypercapnia (HCC) 03/19/2018  . Sepsis, Gram negative (HCC) 03/18/2018  . Nephrolithiasis 03/18/2018  . Urinary retention 03/18/2018  . Migraines 02/16/2017  . Vitamin D deficiency 02/16/2017  . Preventative health care 02/16/2017  . Insomnia 02/16/2017  . Recurrent UTI 02/13/2017  . Depression 02/13/2017  . ADHD 02/13/2017    Past Surgical History:  Procedure Laterality Date  . CYSTOSCOPY W/  URETERAL STENT PLACEMENT Left 03/18/2018   Procedure: CYSTOSCOPY WITH RETROGRADE PYELOGRAM/URETERAL STENT PLACEMENT;  Surgeon: Jerilee Field, MD;  Location: [facility];  Service: Urology;  Laterality: Left;  . CYSTOSCOPY/URETEROSCOPY/HOLMIUM LASER/STENT PLACEMENT Left 04/15/2018   Procedure: CYSTOSCOPY/URETEROSCOPY stent removal and stone extraction;  Surgeon: Jerilee Field, MD;  Location: [facility];  Service:  Urology;  Laterality: Left;  ONLY NEEDS 60 MIN  . WISDOM TOOTH EXTRACTION       OB History    Gravida  1   Para      Term      Preterm      AB  1   Living        SAB  1   TAB      Ectopic      Multiple      Live Births               Home Medications    Prior to Admission medications   Medication Sig Start Date End Date Taking? Authorizing Provider  acyclovir ointment (ZOVIRAX) 5 % Apply topically every 4 (four) hours. 03/27/18   Osvaldo Shipper, MD  amphetamine-dextroamphetamine (ADDERALL) 5 MG tablet Take 5 mg by mouth daily.    [provider]  fluticasone (FLONASE) 50 MCG/ACT nasal spray Place 1 spray into both nostrils 2 (two) times daily. 03/09/18 05/08/18  Betancourt, Jarold Song, NP  ketorolac (TORADOL) 10 MG tablet Take 10 mg by mouth every 6 (six) hours as needed.    [provider]  LORazepam (ATIVAN) 1 MG tablet Take 1 tablet (1 mg total) by mouth 2 (two) times daily as needed for anxiety. 03/27/18   Osvaldo Shipper, MD  ondansetron (ZOFRAN) 4 MG tablet Take 1 tablet (4 mg total) by mouth every 8 (eight) hours as needed for nausea or vomiting. 03/27/18   Osvaldo Shipper, MD  oxyCODONE-acetaminophen (PERCOCET/ROXICET) 5-325 MG tablet Take 1 tablet by mouth every 6 (six) hours as needed for severe pain. 04/05/18   Sandford Craze, NP  psyllium (METAMUCIL SMOOTH TEXTURE) 58.6 % powder Take 1 packet by mouth 3 (three) times daily. Patient taking differently: Take 1 packet by mouth daily.  04/05/18   Sandford Craze, NP    Family History Family History  Problem Relation Age of Onset  . Healthy Mother   . Diabetes Maternal Grandmother   . Hypertension Maternal Grandmother   . Breast cancer Maternal Grandmother     Social History Social History   Tobacco Use  . Smoking status: Never Smoker  . Smokeless tobacco: Never Used  Substance Use Topics  . Alcohol use: Yes  . Drug use: No     Allergies   Vicodin  [hydrocodone-acetaminophen]   Review of Systems Review of Systems  Constitutional: Negative for chills and fever.  HENT: Negative.   Eyes: Negative for visual disturbance.  Respiratory: Positive for chest tightness and shortness of breath. Negative for cough and wheezing.   Cardiovascular: Positive for chest pain. Negative for palpitations and leg swelling.  Gastrointestinal: Negative for abdominal pain, constipation, diarrhea, nausea and vomiting.  Genitourinary: Negative for dysuria, flank pain, frequency and hematuria.  Musculoskeletal: Negative for arthralgias, myalgias, neck pain and neck stiffness.  Skin: Negative for color change and rash.  Neurological: Positive for weakness, light-headedness, numbness and headaches. Negative for dizziness, seizures, syncope, facial asymmetry and speech difficulty.     Physical Exam Updated Vital Signs BP 104/77 (BP Location: Right Arm)   Pulse (!) 106   Temp 98.3 F (  36.8 C) (Oral)   Resp (!) 24   Ht 5\' 2"  (1.575 m)   Wt 79.8 kg   LMP 04/03/2018 (Approximate)   SpO2 98%   BMI 32.19 kg/m   Physical Exam Vitals signs and nursing note reviewed.  Constitutional:      Appearance: She is well-developed. She is not ill-appearing.     Comments: On arrival patient is alert, she appears extremely anxious and somewhat distressed  HENT:     Head: Normocephalic and atraumatic.     Mouth/Throat:     Mouth: Mucous membranes are moist.     Pharynx: Oropharynx is clear.  Eyes:     General:        Right eye: No discharge.        Left eye: No discharge.     Pupils: Pupils are equal, round, and reactive to light.  Neck:     Musculoskeletal: Neck supple.  Cardiovascular:     Rate and Rhythm: Regular rhythm. Tachycardia present.     Pulses: Normal pulses.          Radial pulses are 2+ on the right side and 2+ on the left side.       Dorsalis pedis pulses are 2+ on the right side and 2+ on the left side.       Posterior tibial pulses are 2+ on  the right side and 2+ on the left side.     Heart sounds: Normal heart sounds. No murmur. No friction rub. No gallop.   Pulmonary:     Effort: Pulmonary effort is normal. No respiratory distress.     Breath sounds: Normal breath sounds. No wheezing or rales.     Comments: Respirations equal and unlabored, patient able to speak in full sentences, lungs clear to auscultation bilaterally Chest:     Chest wall: No tenderness.  Abdominal:     General: Bowel sounds are normal. There is no distension.     Palpations: Abdomen is soft. There is no mass.     Tenderness: There is no abdominal tenderness. There is no guarding.     Comments: Abdomen soft, nondistended, nontender to palpation in all quadrants without guarding or peritoneal signs  Musculoskeletal:        General: No deformity.     Right lower leg: She exhibits no tenderness. No edema.     Left lower leg: She exhibits no tenderness. No edema.  Skin:    General: Skin is warm and dry.     Capillary Refill: Capillary refill takes less than 2 seconds.  Neurological:     Mental Status: She is alert.     Coordination: Coordination normal.     Comments: Speech is clear, able to follow commands CN III-XII intact Left upper extremity with 4.5/5 strength compared to 5/5 in the right, patient also reports some decreased sensation in the left upper extremity. Bilateral lower extremities with normal 5/5 strength normal sensation Moves extremities without ataxia, coordination intact   Psychiatric:        Mood and Affect: Mood is anxious.        Behavior: Behavior normal.      ED Treatments / Results  Labs (all labs ordered are listed, but only abnormal results are displayed) Labs Reviewed  BASIC METABOLIC PANEL - Abnormal; Notable for the following components:      Result Value   CO2 20 (*)    Glucose, Bld 101 (*)    All other components within normal  limits  CBC  TROPONIN I  BRAIN NATRIURETIC PEPTIDE  D-DIMER, QUANTITATIVE (NOT AT  [facility])  URINALYSIS, ROUTINE W REFLEX MICROSCOPIC  I-STAT BETA HCG BLOOD, ED ([facility], [facility], [facility] ONLY)  I-STAT BETA HCG BLOOD, ED (NOT ORDERABLE)    EKG EKG Interpretation  Date/Time:  Sunday April 18 2018 20:28:57 EST Ventricular Rate:  99 PR Interval:    QRS Duration: 70 QT Interval:  318 QTC Calculation: 408 R Axis:   47 Text Interpretation:  Sinus rhythm Consider right atrial enlargement Low voltage, precordial leads compared with 1/20 Confirmed by Meridee Score [phone]) on 04/18/2018 9:22:47 PM   Radiology Dg Chest 2 View  Result Date: 04/18/2018 CLINICAL DATA:  Acute respiratory failure EXAM: CHEST - 2 VIEW COMPARISON:  03/23/2018 FINDINGS: Normal mediastinum and cardiac silhouette. Normal pulmonary vasculature. No evidence of effusion, infiltrate, or pneumothorax. No acute bony abnormality. IMPRESSION: No acute cardiopulmonary process. Electronically Signed   By: Genevive Bi M.D.   On: 04/18/2018 20:42    Procedures Procedures (including critical care time)  Medications Ordered in ED Medications  sodium chloride 0.9 % bolus 500 mL (500 mLs Intravenous New Bag/Given 04/18/18 2218)  sodium chloride (PF) 0.9 % injection (has no administration in time range)  iopamidol (ISOVUE-370) 76 % injection (has no administration in time range)  albuterol (PROVENTIL) (2.5 MG/3ML) 0.083% nebulizer solution 5 mg (5 mg Nebulization Given 04/18/18 2106)  sodium chloride flush (NS) 0.9 % injection 3 mL (3 mLs Intravenous Given 04/18/18 2115)  LORazepam (ATIVAN) injection 1 mg (1 mg Intravenous Given 04/18/18 2219)  fentaNYL (SUBLIMAZE) injection 50 mcg (50 mcg Intravenous Given 04/18/18 2222)  iopamidol (ISOVUE-370) 76 % injection 100 mL (100 mLs Intravenous Contrast Given 04/18/18 2307)     Initial Impression / Assessment and Plan / ED Course  I have reviewed the triage vital signs and the nursing notes.  Pertinent labs & imaging results that were available during my care of the patient were  reviewed by me and considered in my medical decision making (see chart for details).  33 year old female with multiple complaints reporting 3 days of headache, today began having left-sided chest pain and shortness of breath and also reports some numbness and weakness in her left hand and arm which started at 4:00 this afternoon she thinks.  Patient had recent admission for kidney stone where she became septic, requiring pressors and intubation but prior to this aside from severe anxiety was relatively healthy.  Patient endorses feeling very anxious today especially because this is the first time she is return to the hospital since her recent admission.  But reports that this chest pain feels different than the pain she typically has with her anxiety.  On arrival patient is mildly tachycardic, afebrile and all other vitals are normal.  She appears extremely anxious and is complaining of pain over the left side of her chest, lungs are clear and heart with regular rhythm.  She denies any abdominal pain or flank pain reports since her discharge from hospital she has recovering from this significantly and flank pain has almost completely resolved.  She has not had any fevers or chills at home, no productive cough.  Chest pain and shortness of breath in the setting of headache and left arm weakness does raise some concern for possible dissection of the aortic arch causing neurologic symptoms, but given patient's recent prolonged hospitalization she is also certainly at risk for PE, no lower extremity edema or pain on exam.  Basic labs, troponin and  pregnancy test ordered from triage will add on BNP and d-dimer, chest x-ray shows no active cardiopulmonary disease and EKG shows sinus rhythm without concerning changes.  Will get Non-con head CT and CT NGO of the chest for further evaluation.  Ativan, fentanyl and fluids given for symptomatic management.  Labs so far reassuring, no leukocytosis and normal hemoglobin, no  acute electrolyte derangements requiring intervention and normal renal function.  Negative troponin.  BNP is not elevated.  Negative pregnancy.  11:00 PM care signed out to [facility] at shift change, CT head and CT Angio of the chest pending at handoff.  Patient's anxiety and chest pain have improved with medication she continues to complain of headache but reports slight improvement in this.  Patient continues to have some weakness in the left hand although this is improved from my previous exam.  If CT imaging is negative and patient continues to exhibit headache and weakness will likely require MRI of the brain.    Final Clinical Impressions(s) / ED Diagnoses   Final diagnoses:  Acute nonintractable headache, unspecified headache type  Chest pain, unspecified type  Shortness of breath  Left hand paresthesia    ED Discharge Orders    None       Legrand RamsFord, Tomiko Schoon N, PA-C 04/18/18 2345    Terrilee FilesButler, Michael C, MD 04/20/18 0000

## 2018-04-18 NOTE — ED Triage Notes (Signed)
Patient is complaining of headache since Friday. Patient is also complaining of pain in left chest. Numbness in left arm and fingers.

## 2018-04-19 ENCOUNTER — Emergency Department (HOSPITAL_COMMUNITY): Payer: No Typology Code available for payment source

## 2018-04-19 ENCOUNTER — Encounter: Payer: Self-pay | Admitting: Neurology

## 2018-04-19 DIAGNOSIS — R0602 Shortness of breath: Secondary | ICD-10-CM | POA: Diagnosis not present

## 2018-04-19 DIAGNOSIS — M5412 Radiculopathy, cervical region: Secondary | ICD-10-CM | POA: Diagnosis not present

## 2018-04-19 DIAGNOSIS — R202 Paresthesia of skin: Secondary | ICD-10-CM | POA: Diagnosis not present

## 2018-04-19 DIAGNOSIS — Z79899 Other long term (current) drug therapy: Secondary | ICD-10-CM | POA: Diagnosis not present

## 2018-04-19 DIAGNOSIS — I110 Hypertensive heart disease with heart failure: Secondary | ICD-10-CM | POA: Diagnosis not present

## 2018-04-19 DIAGNOSIS — R51 Headache: Secondary | ICD-10-CM | POA: Diagnosis not present

## 2018-04-19 DIAGNOSIS — I5030 Unspecified diastolic (congestive) heart failure: Secondary | ICD-10-CM | POA: Diagnosis not present

## 2018-04-19 DIAGNOSIS — R079 Chest pain, unspecified: Secondary | ICD-10-CM | POA: Diagnosis present

## 2018-04-19 LAB — URINALYSIS, ROUTINE W REFLEX MICROSCOPIC
BACTERIA UA: NONE SEEN
Bilirubin Urine: NEGATIVE
Glucose, UA: NEGATIVE mg/dL
KETONES UR: NEGATIVE mg/dL
Leukocytes,Ua: NEGATIVE
Nitrite: NEGATIVE
Protein, ur: NEGATIVE mg/dL
Specific Gravity, Urine: 1.04 — ABNORMAL HIGH (ref 1.005–1.030)
pH: 6 (ref 5.0–8.0)

## 2018-04-19 MED ORDER — METOCLOPRAMIDE HCL 5 MG/ML IJ SOLN
10.0000 mg | Freq: Once | INTRAMUSCULAR | Status: AC
  Administered 2018-04-19: 10 mg via INTRAVENOUS
  Filled 2018-04-19: qty 2

## 2018-04-19 MED ORDER — DIPHENHYDRAMINE HCL 50 MG/ML IJ SOLN
25.0000 mg | Freq: Once | INTRAMUSCULAR | Status: AC
  Administered 2018-04-19: 25 mg via INTRAVENOUS
  Filled 2018-04-19: qty 1

## 2018-04-19 MED ORDER — VALPROATE SODIUM 500 MG/5ML IV SOLN
500.0000 mg | Freq: Once | INTRAVENOUS | Status: AC
  Administered 2018-04-19: 500 mg via INTRAVENOUS
  Filled 2018-04-19: qty 5

## 2018-04-19 NOTE — ED Notes (Signed)
Pt oob to bathroom with steady gait. 

## 2018-04-19 NOTE — ED Provider Notes (Signed)
Care assumed from .  Please see her full H&P.  In short,  Stephanie Patrick is a 33 y.o. female with a history of history of severe anxiety, cardiomyopathy, diastolic heart failure, and recent admission for gram-negative sepsis requiring intubation and pressors after kidney stone presents for evaluation of frontal headache, chest pain and shortness of breath.  Patient reports she has had generalized headache since that time and it has gradually worsened.  Patient reports currently it is the worst headache she is ever had.  She reports developing left-sided chest pain with shortness of breath this morning which has persisted and worsened.  She reports associated lightheadedness.  Around 4 PM she developed tingling and numbness of her left hand and arm.  She denies vision changes, facial asymmetry, changes in speech, numbness, tingling or weakness in any other extremity.  Here in the emergency department patient was found to have left upper extremity weakness.    Physical Exam  BP 100/75   Pulse 86   Temp 98.3 F (36.8 C) (Oral)   Resp 16   Ht 5\' 2"  (1.575 m)   Wt 79.8 kg   LMP 04/03/2018 (Approximate)   SpO2 98%   BMI 32.19 kg/m   Physical Exam Vitals signs and nursing note reviewed.  Constitutional:      General: She is not in acute distress.    Appearance: She is well-developed.  HENT:     Head: Normocephalic.  Eyes:     General: No scleral icterus.    Conjunctiva/sclera: Conjunctivae normal.  Neck:     Musculoskeletal: Normal range of motion.  Cardiovascular:     Rate and Rhythm: Normal rate.  Pulmonary:     Effort: Pulmonary effort is normal.  Musculoskeletal: Normal range of motion.  Skin:    General: Skin is warm and dry.  Neurological:     Mental Status: She is alert.     Comments: Patient with persistently subjectively decreased sensation in the left upper extremity.  4/5 strength in the left upper extremity, 5/5 in the right upper extremity, 5/5 in the bilateral lower  extremities.  Sensation intact to normal touch throughout right upper and bilateral lower extremities.  Abnormal, somewhat shuffling gait.  No foot drop.     ED Course/Procedures   Clinical Course as of Apr 19 329  Sun Apr 18, 2018  [id number] 33 year old female with recent admission for an infected kidney stone that went septic and caused her prolonged ICU stay with intubation and pressors.  She said she is been doing better and has some general deconditioning but started with some left-sided chest squeezing pain and shortness of breath this morning.  She then noticed she was having some difficulty in using her left hand.  She has had a headache.  She is chronic anxiety but she says this feels different although she definitely feels anxious because of this.  She has a fairly benign physical exam other than tachycardia.  She is getting some lab work EKG chest x-ray and will be put in for some CT imaging of her head and chest.   [MB]    Clinical Course User Index [MB] Terrilee Files, MD    Procedures   Results for orders placed or performed during the hospital encounter of 04/18/18  Basic metabolic panel  Result Value Ref Range   Sodium 137 135 - 145 mmol/L   Potassium 3.7 3.5 - 5.1 mmol/L   Chloride 105 98 - 111 mmol/L   CO2 20 (L)  22 - 32 mmol/L   Glucose, Bld 101 (H) 70 - 99 mg/dL   BUN 13 6 - 20 mg/dL   Creatinine, Ser 7.50 0.44 - 1.00 mg/dL   Calcium 9.4 8.9 - 51.8 mg/dL   GFR calc non Af Amer >60 >60 mL/min   GFR calc Af Amer >60 >60 mL/min   Anion gap 12 5 - 15  CBC  Result Value Ref Range   WBC 10.3 4.0 - 10.5 K/uL   RBC 4.98 3.87 - 5.11 MIL/uL   Hemoglobin 14.3 12.0 - 15.0 g/dL   HCT 33.5 82.5 - 18.9 %   MCV 87.8 80.0 - 100.0 fL   MCH 28.7 26.0 - 34.0 pg   MCHC 32.7 30.0 - 36.0 g/dL   RDW 84.2 10.3 - 12.8 %   Platelets 370 150 - 400 K/uL   nRBC 0.0 0.0 - 0.2 %  Troponin I - ONCE - STAT  Result Value Ref Range   Troponin I <0.03 <0.03 ng/mL  Brain natriuretic  peptide  Result Value Ref Range   B Natriuretic Peptide 28.9 0.0 - 100.0 pg/mL  D-dimer, quantitative (not at [facility])  Result Value Ref Range   D-Dimer, Quant 0.52 (H) 0.00 - 0.50 ug/mL-FEU  Urinalysis, Routine w reflex microscopic  Result Value Ref Range   Color, Urine YELLOW YELLOW   APPearance CLEAR CLEAR   Specific Gravity, Urine 1.040 (H) 1.005 - 1.030   pH 6.0 5.0 - 8.0   Glucose, UA NEGATIVE NEGATIVE mg/dL   Hgb urine dipstick SMALL (A) NEGATIVE   Bilirubin Urine NEGATIVE NEGATIVE   Ketones, ur NEGATIVE NEGATIVE mg/dL   Protein, ur NEGATIVE NEGATIVE mg/dL   Nitrite NEGATIVE NEGATIVE   Leukocytes,Ua NEGATIVE NEGATIVE   RBC / HPF 0-5 0 - 5 RBC/hpf   WBC, UA 0-5 0 - 5 WBC/hpf   Bacteria, UA NONE SEEN NONE SEEN   Squamous Epithelial / LPF 0-5 0 - 5   Mucus PRESENT   I-Stat beta hCG blood, ED  Result Value Ref Range   I-stat hCG, quantitative <5.0 <5 mIU/mL   Comment 3           Dg Chest 2 View  Result Date: 04/18/2018 CLINICAL DATA:  Acute respiratory failure EXAM: CHEST - 2 VIEW COMPARISON:  03/23/2018 FINDINGS: Normal mediastinum and cardiac silhouette. Normal pulmonary vasculature. No evidence of effusion, infiltrate, or pneumothorax. No acute bony abnormality. IMPRESSION: No acute cardiopulmonary process. Electronically Signed   By: Genevive Bi M.D.   On: 04/18/2018 20:42   Ct Head Wo Contrast  Result Date: 04/18/2018 CLINICAL DATA:  33 year old female with headache for 2 days, left chest pain, left upper extremity numbness. EXAM: CT HEAD WITHOUT CONTRAST TECHNIQUE: Contiguous axial images were obtained from the base of the skull through the vertex without intravenous contrast. COMPARISON:  None. FINDINGS: Brain: Occasional dural calcifications, normal variant. No midline shift, ventriculomegaly, mass effect, evidence of mass lesion, intracranial hemorrhage or evidence of cortically based acute infarction. Gray-white matter differentiation is within normal limits  throughout the brain. Vascular: No suspicious intracranial vascular hyperdensity. Skull: Negative. Sinuses/Orbits: Visualized paranasal sinuses and mastoids are well pneumatized. Other: Visualized orbits and scalp soft tissues are within normal limits. IMPRESSION: Normal noncontrast head CT. Electronically Signed   By: Odessa Fleming M.D.   On: 04/18/2018 23:37   Dg Chest Port 1 View  Result Date: 03/23/2018 CLINICAL DATA:  Pulmonary edema EXAM: PORTABLE CHEST 1 VIEW COMPARISON:  03/20/2018 FINDINGS: Right IJ central venous catheter  tip is at the cavoatrial junction. There is shallow lung inflation with mild cardiomegaly. Mild pulmonary edema persists. Endotracheal tube and orogastric tube have been removed. IMPRESSION: Unchanged mild pulmonary edema, status post endotracheal extubation. Electronically Signed   By: Deatra RobinsonKevin  Herman M.D.   On: 03/23/2018 16:44   Dg Chest Port 1 View  Result Date: 03/20/2018 CLINICAL DATA:  Respiratory failure EXAM: PORTABLE CHEST 1 VIEW COMPARISON:  03/19/2018 FINDINGS: Endotracheal tube in good position. Right jugular central venous catheter tip cavoatrial junction unchanged. NG tube in place. Mild improvement in bilateral airspace disease. Small bilateral effusions unchanged. IMPRESSION: Improvement in bilateral edema pattern. Persistent bibasilar atelectasis and effusion. Support lines in good position. Electronically Signed   By: Marlan Palauharles  Clark M.D.   On: 03/20/2018 06:42   Ct Angio Chest Aorta W And/or Wo Contrast  Result Date: 04/18/2018 CLINICAL DATA:  33 year old female with headache for 2 days, left chest pain, left upper extremity numbness. EXAM: CT ANGIOGRAPHY CHEST WITH CONTRAST TECHNIQUE: Multidetector CT imaging of the chest was performed using the standard protocol during bolus administration of intravenous contrast. Multiplanar CT image reconstructions and MIPs were obtained to evaluate the vascular anatomy. CONTRAST:  100mL ISOVUE-370 IOPAMIDOL (ISOVUE-370)  INJECTION 76% COMPARISON:  Chest radiographs earlier today. FINDINGS: Cardiovascular: Adequate contrast bolus timing in the pulmonary arterial tree. Minor respiratory motion. No focal filling defect identified in the pulmonary arteries to suggest acute pulmonary embolism. No calcified coronary artery atherosclerosis is evident. Negative visible aorta and proximal great vessels. Cardiac size within normal limits. No pericardial effusion. Mediastinum/Nodes: Negative, no lymphadenopathy. Negative visible thoracic inlet. Lungs/Pleura: Major airways are patent. Minor dependent atelectasis. No pleural effusion or other abnormal pulmonary opacity. Upper Abdomen: Negative visible liver, spleen, pancreas, adrenal glands, kidneys, and stomach. Musculoskeletal: Negative. Review of the MIP images confirms the above findings. IMPRESSION: Negative chest CTA.  No evidence of acute pulmonary embolus. Electronically Signed   By: Odessa FlemingH  Hall M.D.   On: 04/18/2018 23:42    MDM   Patient here with headache, chest pain or shortness of breath.  Concern for possible pulmonary embolism, aortic dissection given chest pain, shortness of breath and left arm weakness.  CT scan of her head is without acute abnormality including no intracranial hemorrhage, CT scan of her chest without evidence of pulmonary embolism or aortic dissection.  No evidence of pneumonia.  Patient is very anxious and tachypneic.  She was tachycardic and tachypneic on arrival.  She has been afebrile throughout her time here in the emergency department.  Continues to have worsening frontal headache.  She continues to deny vision changes, speech difficulties, weakness in the right upper extremity or bilateral lower extremities.  Patient continues to have left upper extremity weakness.  She also continues with subjective decrease sensation in the left upper extremity.  Boyfriend at bedside reports patient had a hard time walking into the emergency department due to being  weak but he denies any specific gait disturbance, foot drop or dragging of her leg.  Patient indeed has much difficulty walking here with a somewhat shuffling gait.  Patient will need MRI of her head and neck for left upper extremity weakness.  [facility] in ED [facility] ED transfer.    BP 113/70   Pulse 94   Temp 98.3 F (36.8 C) (Oral)   Resp (!) 27   Ht 5\' 2"  (1.575 m)   Wt 79.8 kg   LMP 04/03/2018 (Approximate)   SpO2 100%  BMI 32.19 kg/m   Chest pain, unspecified type  Shortness of breath  Left hand paresthesia  Acute intractable headache, unspecified headache type     Milta Deiters 04/19/18 [id number]    Ward, Layla Maw, DO 04/19/18 [phone]

## 2018-04-19 NOTE — ED Notes (Signed)
Spoke to charge nurse at [facility] to make aware of ED- ED transfer.

## 2018-04-19 NOTE — ED Notes (Signed)
Pt talking on phone and states ride cannot wait for her.

## 2018-04-19 NOTE — ED Provider Notes (Signed)
[facility] EMERGENCY DEPARTMENT Provider Note   CSN: [id number] Arrival date & time: 04/18/18  1939     History   Chief Complaint Chief Complaint  Patient presents with  . Shortness of Breath  . Chest Pain    HPI Stephanie Patrick is a 33 y.o. female.  Patient with PMH history of severe anxiety, cardiomyopathy, diastolic heart failure, and recent admission for gram-negative sepsis requiring intubation and pressors after kidney stone, who presents for evaluation of headache, chest pain and shortness of breath.  Patient reports that she had her ureteral stent removed by Dr. Mena Goes on Thursday 2/13 out complications.  Friday she developed a generalized headache, that was gradual in onset and has persisted since then.  She was transferred from [facility] hospital after her headache progressively worsened and is now complaining of left sided weakness and some gait instability.  CT of her head was negative at [facility] along with negative CT PE study.  She was transferred to [facility] for MRI of her brain and her cervical spine.  She currently rates her headache as a 5 out of 10.  He denies any vision changes or speech changes.  Denies any numbness or tingling.    The history is provided by the patient. No language interpreter was used.    Past Medical History:  Diagnosis Date  . Acute pyelonephritis 03/2018  . Acute respiratory failure (HCC) 03/25/2018   Ventilatory  . ADHD 02/13/2017  . Anxiety   . Cardiomyopathy (HCC) 03/2018   03/18/2018 EF 30-35% improved to 50-55% on 03/25/2018  . Chicken pox   . Complication of anesthesia    Woke up after surgery, felt paralyzed but unable to speak, made very scared  . Depression   . E. coli sepsis (HCC) 03/2018  . Grade II diastolic dysfunction    noted on ECHO 03/19/2018 had improved on ECHO 03/25/2018  . Gram negative septic shock (HCC) 03/2018  . History of kidney stones 03/18/2018   4 mm stone within the distal  left ureter  . Hydronephrosis 03/19/2018   Left hydronephrosis mild to moderate left hydroureteronephrosis  . Migraines   . Pneumonia   . PONV (postoperative nausea and vomiting)   . Pulmonary edema 03/20/2018   Noted on CXR  . Urinary tract infection     Patient Active Problem List   Diagnosis Date Noted  . Acute systolic CHF (congestive heart failure) (HCC)   . Acute respiratory failure with hypoxia and hypercapnia (HCC) 03/19/2018  . Sepsis, Gram negative (HCC) 03/18/2018  . Nephrolithiasis 03/18/2018  . Urinary retention 03/18/2018  . Migraines 02/16/2017  . Vitamin D deficiency 02/16/2017  . Preventative health care 02/16/2017  . Insomnia 02/16/2017  . Recurrent UTI 02/13/2017  . Depression 02/13/2017  . ADHD 02/13/2017    Past Surgical History:  Procedure Laterality Date  . CYSTOSCOPY W/ URETERAL STENT PLACEMENT Left 03/18/2018   Procedure: CYSTOSCOPY WITH RETROGRADE PYELOGRAM/URETERAL STENT PLACEMENT;  Surgeon: Jerilee Field, MD;  Location: [facility];  Service: Urology;  Laterality: Left;  . CYSTOSCOPY/URETEROSCOPY/HOLMIUM LASER/STENT PLACEMENT Left 04/15/2018   Procedure: CYSTOSCOPY/URETEROSCOPY stent removal and stone extraction;  Surgeon: Jerilee Field, MD;  Location: [facility];  Service: Urology;  Laterality: Left;  ONLY NEEDS 60 MIN  . WISDOM TOOTH EXTRACTION       OB History    Gravida  1   Para      Term      Preterm  AB  1   Living        SAB  1   TAB      Ectopic      Multiple      Live Births               Home Medications    Prior to Admission medications   Medication Sig Start Date End Date Taking? Authorizing Provider  acyclovir ointment (ZOVIRAX) 5 % Apply topically every 4 (four) hours. 03/27/18   Osvaldo Shipper, MD  amphetamine-dextroamphetamine (ADDERALL) 5 MG tablet Take 5 mg by mouth daily.    [provider]  fluticasone (FLONASE) 50 MCG/ACT nasal spray Place 1 spray into both nostrils  2 (two) times daily. 03/09/18 05/08/18  Betancourt, Jarold Song, NP  ketorolac (TORADOL) 10 MG tablet Take 10 mg by mouth every 6 (six) hours as needed.    [provider]  LORazepam (ATIVAN) 1 MG tablet Take 1 tablet (1 mg total) by mouth 2 (two) times daily as needed for anxiety. 03/27/18   Osvaldo Shipper, MD  ondansetron (ZOFRAN) 4 MG tablet Take 1 tablet (4 mg total) by mouth every 8 (eight) hours as needed for nausea or vomiting. 03/27/18   Osvaldo Shipper, MD  oxyCODONE-acetaminophen (PERCOCET/ROXICET) 5-325 MG tablet Take 1 tablet by mouth every 6 (six) hours as needed for severe pain. 04/05/18   Sandford Craze, NP  psyllium (METAMUCIL SMOOTH TEXTURE) 58.6 % powder Take 1 packet by mouth 3 (three) times daily. Patient taking differently: Take 1 packet by mouth daily.  04/05/18   Sandford Craze, NP    Family History Family History  Problem Relation Age of Onset  . Healthy Mother   . Diabetes Maternal Grandmother   . Hypertension Maternal Grandmother   . Breast cancer Maternal Grandmother     Social History Social History   Tobacco Use  . Smoking status: Never Smoker  . Smokeless tobacco: Never Used  Substance Use Topics  . Alcohol use: Yes  . Drug use: No     Allergies   Vicodin [hydrocodone-acetaminophen]   Review of Systems Review of Systems  All other systems reviewed and are negative.    Physical Exam Updated Vital Signs BP 104/71 (BP Location: Right Arm)   Pulse 88   Temp 98.4 F (36.9 C) (Oral)   Resp 14   Ht 5\' 2"  (1.575 m)   Wt 79.8 kg   LMP 04/03/2018 (Approximate)   SpO2 96%   BMI 32.19 kg/m   Physical Exam Vitals signs and nursing note reviewed.  Constitutional:      Appearance: She is well-developed.  HENT:     Head: Normocephalic and atraumatic.  Eyes:     Conjunctiva/sclera: Conjunctivae normal.     Pupils: Pupils are equal, round, and reactive to light.  Neck:     Musculoskeletal: Normal range of motion and neck supple.    Cardiovascular:     Rate and Rhythm: Normal rate and regular rhythm.     Heart sounds: No murmur. No friction rub. No gallop.   Pulmonary:     Effort: Pulmonary effort is normal. No respiratory distress.     Breath sounds: Normal breath sounds. No wheezing or rales.  Chest:     Chest wall: No tenderness.  Abdominal:     General: Bowel sounds are normal. There is no distension.     Palpations: Abdomen is soft. There is no mass.     Tenderness: There is no abdominal tenderness. There  is no guarding or rebound.  Musculoskeletal: Normal range of motion.        General: No tenderness.  Skin:    General: Skin is warm and dry.  Neurological:     Mental Status: She is alert and oriented to person, place, and time.     Comments: Decreased strength in left upper extremity  Psychiatric:        Behavior: Behavior normal.        Thought Content: Thought content normal.        Judgment: Judgment normal.      ED Treatments / Results  Labs (all labs ordered are listed, but only abnormal results are displayed) Labs Reviewed  BASIC METABOLIC PANEL - Abnormal; Notable for the following components:      Result Value   CO2 20 (*)    Glucose, Bld 101 (*)    All other components within normal limits  D-DIMER, QUANTITATIVE (NOT AT [facility]) - Abnormal; Notable for the following components:   D-Dimer, Quant 0.52 (*)    All other components within normal limits  URINALYSIS, ROUTINE W REFLEX MICROSCOPIC - Abnormal; Notable for the following components:   Specific Gravity, Urine 1.040 (*)    Hgb urine dipstick SMALL (*)    All other components within normal limits  CBC  TROPONIN I  BRAIN NATRIURETIC PEPTIDE  I-STAT BETA HCG BLOOD, ED ([facility], [facility], [facility] ONLY)  I-STAT BETA HCG BLOOD, ED (NOT ORDERABLE)    EKG EKG Interpretation  Date/Time:  Sunday April 18 2018 20:28:57 EST Ventricular Rate:  99 PR Interval:    QRS Duration: 70 QT Interval:  318 QTC Calculation: 408 R Axis:   47 Text  Interpretation:  Sinus rhythm Consider right atrial enlargement Low voltage, precordial leads compared with 1/20 Confirmed by Meridee Score [phone]) on 04/18/2018 9:22:47 PM   Radiology Dg Chest 2 View  Result Date: 04/18/2018 CLINICAL DATA:  Acute respiratory failure EXAM: CHEST - 2 VIEW COMPARISON:  03/23/2018 FINDINGS: Normal mediastinum and cardiac silhouette. Normal pulmonary vasculature. No evidence of effusion, infiltrate, or pneumothorax. No acute bony abnormality. IMPRESSION: No acute cardiopulmonary process. Electronically Signed   By: Genevive Bi M.D.   On: 04/18/2018 20:42   Ct Head Wo Contrast  Result Date: 04/18/2018 CLINICAL DATA:  33 year old female with headache for 2 days, left chest pain, left upper extremity numbness. EXAM: CT HEAD WITHOUT CONTRAST TECHNIQUE: Contiguous axial images were obtained from the base of the skull through the vertex without intravenous contrast. COMPARISON:  None. FINDINGS: Brain: Occasional dural calcifications, normal variant. No midline shift, ventriculomegaly, mass effect, evidence of mass lesion, intracranial hemorrhage or evidence of cortically based acute infarction. Gray-white matter differentiation is within normal limits throughout the brain. Vascular: No suspicious intracranial vascular hyperdensity. Skull: Negative. Sinuses/Orbits: Visualized paranasal sinuses and mastoids are well pneumatized. Other: Visualized orbits and scalp soft tissues are within normal limits. IMPRESSION: Normal noncontrast head CT. Electronically Signed   By: Odessa Fleming M.D.   On: 04/18/2018 23:37   Mr Brain Wo Contrast  Result Date: 04/19/2018 CLINICAL DATA:  Initial evaluation for acute severe headache. EXAM: MRI HEAD WITHOUT CONTRAST MRI CERVICAL SPINE WITHOUT CONTRAST TECHNIQUE: Multiplanar, multiecho pulse sequences of the brain and surrounding structures, and cervical spine, to include the craniocervical junction and cervicothoracic junction, were obtained  without intravenous contrast. COMPARISON:  Prior CT from 04/18/2018. FINDINGS: MRI HEAD FINDINGS Brain: Cerebral volume within normal limits for patient age. No focal parenchymal signal abnormality identified. No abnormal foci  of restricted diffusion to suggest acute or subacute ischemia. Gray-white matter differentiation well maintained. No encephalomalacia to suggest chronic infarction. No foci of susceptibility artifact to suggest acute or chronic intracranial hemorrhage. No mass lesion, midline shift or mass effect. No hydrocephalus. No extra-axial fluid collection. Major dural sinuses are grossly patent. Pituitary gland and suprasellar region are normal. Midline structures intact and normal. Vascular: Major intracranial vascular flow voids well maintained and normal in appearance. Skull and upper cervical spine: Craniocervical junction normal. Visualized upper cervical spine within normal limits. Bone marrow signal intensity normal. No scalp soft tissue abnormality. Sinuses/Orbits: Globes and orbital soft tissues within normal limits. Paranasal sinuses are clear. No mastoid effusion. Inner ear structures normal. Other: None. MRI CERVICAL SPINE FINDINGS Alignment: Straightening of the normal cervical lordosis. No listhesis. Vertebrae: Vertebral body heights maintained without evidence for acute or chronic fracture. Bone marrow signal intensity diffusely decreased on T1 weighted imaging, most commonly related to anemia, smoking, or obesity. No discrete or worrisome osseous lesions. No abnormal marrow edema. Cord: Signal intensity within the thoracic spinal cord is normal. Normal cord caliber and morphology. Posterior Fossa, vertebral arteries, paraspinal tissues: Craniocervical junction normal. Paraspinous and prevertebral soft tissues within normal limits. Normal intravascular flow voids present within the vertebral arteries bilaterally. Disc levels: C2-C3: Unremarkable. C3-C4: Mild left eccentric disc bulge  with uncovertebral hypertrophy. No significant spinal stenosis. Mild left C4 foraminal narrowing. C4-C5:  Unremarkable. C5-C6:  Unremarkable. C6-C7:  Unremarkable. C7-T1:  Unremarkable. Visualized upper thoracic spine demonstrates no significant finding. IMPRESSION: 1. Normal MRI of the brain. No acute intracranial abnormality identified. 2. Mild left eccentric disc bulge with uncovertebral hypertrophy at C3-4 with resultant mild left C4 foraminal stenosis. 3. Otherwise unremarkable MRI of the cervical spine. Electronically Signed   By: Rise Mu M.D.   On: 04/19/2018 05:02   Mr Cervical Spine Wo Contrast  Result Date: 04/19/2018 CLINICAL DATA:  Initial evaluation for acute severe headache. EXAM: MRI HEAD WITHOUT CONTRAST MRI CERVICAL SPINE WITHOUT CONTRAST TECHNIQUE: Multiplanar, multiecho pulse sequences of the brain and surrounding structures, and cervical spine, to include the craniocervical junction and cervicothoracic junction, were obtained without intravenous contrast. COMPARISON:  Prior CT from 04/18/2018. FINDINGS: MRI HEAD FINDINGS Brain: Cerebral volume within normal limits for patient age. No focal parenchymal signal abnormality identified. No abnormal foci of restricted diffusion to suggest acute or subacute ischemia. Gray-white matter differentiation well maintained. No encephalomalacia to suggest chronic infarction. No foci of susceptibility artifact to suggest acute or chronic intracranial hemorrhage. No mass lesion, midline shift or mass effect. No hydrocephalus. No extra-axial fluid collection. Major dural sinuses are grossly patent. Pituitary gland and suprasellar region are normal. Midline structures intact and normal. Vascular: Major intracranial vascular flow voids well maintained and normal in appearance. Skull and upper cervical spine: Craniocervical junction normal. Visualized upper cervical spine within normal limits. Bone marrow signal intensity normal. No scalp soft tissue  abnormality. Sinuses/Orbits: Globes and orbital soft tissues within normal limits. Paranasal sinuses are clear. No mastoid effusion. Inner ear structures normal. Other: None. MRI CERVICAL SPINE FINDINGS Alignment: Straightening of the normal cervical lordosis. No listhesis. Vertebrae: Vertebral body heights maintained without evidence for acute or chronic fracture. Bone marrow signal intensity diffusely decreased on T1 weighted imaging, most commonly related to anemia, smoking, or obesity. No discrete or worrisome osseous lesions. No abnormal marrow edema. Cord: Signal intensity within the thoracic spinal cord is normal. Normal cord caliber and morphology. Posterior Fossa, vertebral arteries, paraspinal tissues: Craniocervical junction normal. Paraspinous  and prevertebral soft tissues within normal limits. Normal intravascular flow voids present within the vertebral arteries bilaterally. Disc levels: C2-C3: Unremarkable. C3-C4: Mild left eccentric disc bulge with uncovertebral hypertrophy. No significant spinal stenosis. Mild left C4 foraminal narrowing. C4-C5:  Unremarkable. C5-C6:  Unremarkable. C6-C7:  Unremarkable. C7-T1:  Unremarkable. Visualized upper thoracic spine demonstrates no significant finding. IMPRESSION: 1. Normal MRI of the brain. No acute intracranial abnormality identified. 2. Mild left eccentric disc bulge with uncovertebral hypertrophy at C3-4 with resultant mild left C4 foraminal stenosis. 3. Otherwise unremarkable MRI of the cervical spine. Electronically Signed   By: Rise Mu M.D.   On: 04/19/2018 05:02   Ct Angio Chest Aorta W And/or Wo Contrast  Result Date: 04/18/2018 CLINICAL DATA:  33 year old female with headache for 2 days, left chest pain, left upper extremity numbness. EXAM: CT ANGIOGRAPHY CHEST WITH CONTRAST TECHNIQUE: Multidetector CT imaging of the chest was performed using the standard protocol during bolus administration of intravenous contrast. Multiplanar CT  image reconstructions and MIPs were obtained to evaluate the vascular anatomy. CONTRAST:  ISOVUE-370 IOPAMIDOL (ISOVUE-370) INJECTION 76% COMPARISON:  Chest radiographs earlier today. FINDINGS: Cardiovascular: Adequate contrast bolus timing in the pulmonary arterial tree. Minor respiratory motion. No focal filling defect identified in the pulmonary arteries to suggest acute pulmonary embolism. No calcified coronary artery atherosclerosis is evident. Negative visible aorta and proximal great vessels. Cardiac size within normal limits. No pericardial effusion. Mediastinum/Nodes: Negative, no lymphadenopathy. Negative visible thoracic inlet. Lungs/Pleura: Major airways are patent. Minor dependent atelectasis. No pleural effusion or other abnormal pulmonary opacity. Upper Abdomen: Negative visible liver, spleen, pancreas, adrenal glands, kidneys, and stomach. Musculoskeletal: Negative. Review of the MIP images confirms the above findings. IMPRESSION: Negative chest CTA.  No evidence of acute pulmonary embolus. Electronically Signed   By: Odessa Fleming M.D.   On: 04/18/2018 23:42    Procedures Procedures (including critical care time)  Medications Ordered in ED Medications  sodium chloride (PF) 0.9 % injection (has no administration in time range)  iopamidol (ISOVUE-370) 76 % injection (has no administration in time range)  albuterol (PROVENTIL) (2.5 MG/3ML) 0.083% nebulizer solution 5 mg (5 mg Nebulization Given 04/18/18 2106)  sodium chloride flush (NS) 0.9 % injection 3 mL (3 mLs Intravenous Given 04/18/18 2115)  LORazepam (ATIVAN) injection 1 mg (1 mg Intravenous Given 04/18/18 2219)  fentaNYL (SUBLIMAZE) injection 50 mcg (50 mcg Intravenous Given 04/18/18 2222)  sodium chloride 0.9 % bolus 500 mL (0 mLs Intravenous Stopped 04/18/18 2319)  iopamidol (ISOVUE-370) 76 % injection 100 mL (100 mLs Intravenous Contrast Given 04/18/18 2307)  fentaNYL (SUBLIMAZE) injection 50 mcg (50 mcg Intravenous Given 04/19/18  0005)  metoCLOPramide (REGLAN) injection 10 mg (10 mg Intravenous Given 04/19/18 0210)  diphenhydrAMINE (BENADRYL) injection 25 mg (25 mg Intravenous Given 04/19/18 0212)     Initial Impression / Assessment and Plan / ED Course  I have reviewed the triage vital signs and the nursing notes.  Pertinent labs & imaging results that were available during my care of the patient were reviewed by me and considered in my medical decision making (see chart for details).  Patient transferred from [facility] emergency department to [facility] for MRI of brain and cervical spine.  Patient has had ongoing headache along with some left-sided weakness for several days.  MRI shows normal brain, cervical MRI shows mild disc bulge at C3-4, this could explain the patient's left arm weakness.  I discussed the patient and her MRI results with Dr. Jerrell Belfast, who  also states that MS could be considered, but given her reassuring MRIs, it is highly unlikely.  He recommends outpatient follow-up with neurology for possible nerve conduction studies.  Agrees that symptoms could have been psychogenic in origin, and recommends giving 500 mg IV Depakote once.  I have informed the patient of her results.  She is stable.  She ambulates without difficulty visualized by myself.  She is agreeable with outpatient follow-up.  I have placed a consult for ambulatory referral to neurology.  MSE complete, no further emergency department work-up indicated at this time.  Final Clinical Impressions(s) / ED Diagnoses   Final diagnoses:  Chest pain, unspecified type  Shortness of breath  Left hand paresthesia  Acute intractable headache, unspecified headache type    ED Discharge Orders    None       Roxy HorsemanBrowning, Coulton Schlink, PA-C 04/19/18 [id number]    Derwood KaplanNanavati, Ankit, MD 04/19/18 0710

## 2018-04-19 NOTE — ED Notes (Signed)
Patient transported to MRI 

## 2018-04-19 NOTE — ED Notes (Addendum)
Pt discharge instructions discussed and pt voices understnding of instructions and follow up. Pt home stable with steady gait but request wc to front. Same done.Marland Kitchen

## 2018-04-19 NOTE — ED Notes (Signed)
Pt ambulated to the restroom, gait unsteady. Pt staying she feels exhausted and extremely weak. Ambulated with assistance.

## 2018-04-19 NOTE — ED Notes (Signed)
[facility] called for transport. 

## 2018-04-20 ENCOUNTER — Ambulatory Visit: Payer: Self-pay | Admitting: Registered Nurse

## 2018-04-20 VITALS — BP 103/74 | HR 102 | Temp 99.6°F

## 2018-04-20 DIAGNOSIS — B009 Herpesviral infection, unspecified: Secondary | ICD-10-CM

## 2018-04-20 DIAGNOSIS — B002 Herpesviral gingivostomatitis and pharyngotonsillitis: Secondary | ICD-10-CM

## 2018-04-20 MED ORDER — VALACYCLOVIR HCL 500 MG PO TABS: 500.0000 mg | ORAL_TABLET | Freq: Every day | ORAL | 0 refills | Status: AC

## 2018-04-20 NOTE — Patient Instructions (Signed)
Cold Sore    A cold sore, also called a fever blister, is a small, fluid-filled sore that forms inside the mouth or on the lips, gums, nose, chin, or cheeks. Cold sores can spread to other parts of the body, such as the eyes or fingers. In some people who have other medical conditions, cold sores can spread to multiple other body sites, including the genitals.  Cold sores can spread from person to person (are contagious) until the sores crust over completely. Most cold sores go away within 2 weeks.  What are the causes?  Cold sores are caused by an infection from a common type of herpes simplex virus (HSV-1). HSV-1 is closely related to the HSV-2virus, which is the virus that causes genital herpes, but these viruses are not the same. Once a person is infected with HSV-1, the virus remains permanently in the body.  HSV-1 is spread from person to person through close contact, such as through kissing, touching the affected area, or sharing personal items such as lip balm, razors, a drinking glass, or eating utensils.  What increases the risk?  You are more likely to develop this condition if you:  · Are tired, stressed, or sick.  · Are menstruating.  · Are pregnant.  · Take certain medicines.  · Are exposed to cold weather or too much sun.  What are the signs or symptoms?  Symptoms of a cold sore outbreak go through different stages. These are the stages of a cold sore:  · Tingling, itching, or burning is felt 1-2 days before the outbreak.  · Fluid-filled blisters appear on the lips, inside the mouth, on the nose, or on the cheeks.  · The blisters start to ooze clear fluid.  · The blisters dry up, and a yellow crust appears in their place.  · The crust falls off.  In some cases, other symptoms can develop during a cold sore outbreak. These can include:  · Fever.  · Sore throat.  · Headache.  · Muscle aches.  · Swollen neck glands.  How is this diagnosed?  This condition is diagnosed based on your medical history and a  physical exam. Your health care provider may do a blood test or may swab some fluid from your sore and then examine the swab in the lab.  How is this treated?  There is no cure for cold sores or HSV-1. There is also no vaccine for HSV-1. Most cold sores go away on their own without treatment within 2 weeks. Medicines cannot make the infection go away, but your health care provider may prescribe medicines to:  · Help relieve some of the pain associated with the sores.  · Work to stop the virus from multiplying.  · Shorten healing time.  Medicines may be in the form of creams, gels, pills, or a shot.  Follow these instructions at home:  Medicines  · Take or apply over-the-counter and prescription medicines only as told by your health care provider.  · Use a cotton-tip swab to apply creams or gels to your sores.  · Ask your health care provider if you can take lysine supplements. Research has found that lysine may help heal the cold sore faster and prevent outbreaks.  Sore care    · Do not touch the sores or pick the scabs.  · Wash your hands often. Do not touch your eyes without washing your hands first.  · Keep the sores clean and dry.  · If directed,   apply ice to the sores:  ? Put ice in a plastic bag.  ? Place a towel between your skin and the bag.  ? Leave the ice on for 20 minutes, 2-3 times a day.  Eating and drinking  · Eat a soft, bland diet. Avoid eating hot, cold, or salty foods.  · Use a straw if it hurts to drink out of a glass.  · Eat foods that are rich in lysine, such as meat, fish, and dairy products.  · Avoid sugary foods, chocolates, nuts, and grains. These foods are rich in a nutrient called arginine, which can cause the virus to multiply.  Lifestyle  · Do not kiss, have oral sex, or share personal items until your sores heal.  · Stress, poor sleep, and being out in the sun can trigger outbreaks. Make sure you:  ? Do activities that help you relax, such as deep breathing exercises or  meditation.  ? Get enough sleep.  ? Apply sunscreen on your lips before you go out in the sun.  Contact a health care provider if:  · You have symptoms for more than 2 weeks.  · You have pus coming from the sores.  · You have redness that is spreading.  · You have pain or irritation in your eye.  · You get sores on your genitals.  · Your sores do not heal within 2 weeks.  · You have frequent cold sore outbreaks.  Get help right away if you have:  · A fever and your symptoms suddenly get worse.  · A headache and confusion.  · Fatigue or loss of appetite.  · A stiff neck or sensitivity to light.  Summary  · A cold sore, also called a fever blister, is a small, fluid-filled sore that forms inside the mouth or on the lips, gums, nose, chin, or cheeks.  · Most cold sores go away on their own without treatment within 2 weeks. Your health care provider may prescribe medicines to help relieve some of the pain, work to stop the virus from multiplying, and shorten healing time.  · Wash your hands often. Do not touch your eyes without washing your hands first.  · Do not kiss, have oral sex, or share personal items until your sores heal.  · Contact a health care provider if your sores do not heal within 2 weeks.  This information is not intended to replace advice given to you by your health care provider. Make sure you discuss any questions you have with your health care provider.  Document Released: 02/15/2000 Document Revised: 07/20/2017 Document Reviewed: 07/20/2017  Elsevier Interactive Patient Education © 2019 [employer]

## 2018-04-20 NOTE — Progress Notes (Signed)
Subjective:    Patient ID: Stephanie Patrick, female    DOB: 1985/07/10, 33 y.o.   MRN: [id number]  Pt requesting Valtrex fill. Reports she is having a procedure done soon and the stress of this usually brings on a cold sore. She has acyclovir ointment at home to use if she gets a cold sore but would like to be able to prevent this. Believes her previous dosing has been 1 gm once a day from her previous pcp in [place].   Doing well s/p stent removal by urology 04/15/2018 did have ER visit 04/18/2018 for chest pain/headache/arm weakness workup negative except mild disc bulge C3-4 and was given neurology referral.  Feeling well today denied headache/chest pain/weakness today or loss of bowel/bladder control after ER discharge.     Review of Systems  Constitutional: Negative for activity change, appetite change, chills, diaphoresis, fatigue and fever.  HENT: Negative for trouble swallowing and voice change.   Eyes: Negative for photophobia and visual disturbance.  Respiratory: Negative for cough, wheezing and stridor.   Cardiovascular: Positive for chest pain. Negative for palpitations and leg swelling.  Gastrointestinal: Negative for diarrhea, nausea and vomiting.  Endocrine: Negative for cold intolerance and heat intolerance.  Genitourinary: Negative for difficulty urinating and enuresis.  Musculoskeletal: Negative for gait problem, myalgias, neck pain and neck stiffness.  Skin: Negative for color change, pallor, rash and wound.  Allergic/Immunologic: Negative for food allergies.  Neurological: Positive for weakness and headaches. Negative for tremors, seizures, syncope, facial asymmetry, speech difficulty, light-headedness and numbness.  Hematological: Negative for adenopathy. Does not bruise/bleed easily.  Psychiatric/Behavioral: Negative for agitation, confusion and sleep disturbance.       Objective:   Physical Exam Vitals signs and nursing note reviewed.  Constitutional:       General: She is not in acute distress.    Appearance: Normal appearance. She is well-developed. She is not ill-appearing, toxic-appearing or diaphoretic.  HENT:     Head: Normocephalic and atraumatic.     Nose: Nose normal.     Mouth/Throat:     Mouth: Mucous membranes are moist.     Pharynx: No oropharyngeal exudate or posterior oropharyngeal erythema.  Eyes:     General: Lids are normal.     Extraocular Movements: Extraocular movements intact.     Conjunctiva/sclera: Conjunctivae normal.     Pupils: Pupils are equal, round, and reactive to light.  Neck:     Musculoskeletal: Normal range of motion and neck supple.     Trachea: Trachea normal.  Cardiovascular:     Rate and Rhythm: Normal rate and regular rhythm.     Heart sounds: Normal heart sounds.  Pulmonary:     Effort: Pulmonary effort is normal. No respiratory distress.     Breath sounds: Normal breath sounds. No stridor. No wheezing, rhonchi or rales.  Abdominal:     Palpations: Abdomen is soft.  Musculoskeletal: Normal range of motion.     Right lower leg: No edema.     Left lower leg: No edema.  Skin:    General: Skin is warm and dry.     Capillary Refill: Capillary refill takes less than 2 seconds.     Coloration: Skin is not ashen, cyanotic, jaundiced, mottled, pale or sallow.     Findings: No bruising, erythema, lesion or rash.     Nails: There is no clubbing.   Neurological:     General: No focal deficit present.     Mental Status: She is alert and  oriented to person, place, and time. Mental status is at baseline.     Cranial Nerves: No cranial nerve deficit.     Sensory: No sensory deficit.     Motor: No weakness.     Coordination: Coordination normal.     Gait: Gait normal.  Psychiatric:        Mood and Affect: Mood normal.        Speech: Speech normal.        Behavior: Behavior normal.        Thought Content: Thought content normal.        Judgment: Judgment normal.           Assessment & Plan:   A-history oral herpes simplex, ED visit  P-Ran out of valtrex that she typically would start suppression dose prior to dental appts to prevent outbreaks.  Has dental procedure scheduled later this week and again next month would like refill on her Rx.  [facility] Rx [facility] [place] valacyclovir 500mg  po daily #30 RF0.  She has topical acyclovir she prefers if outbreak occurs at home for prn use.  Follow up if worsening size/frequency of oral herpes outbreaks for re-evaluation.  Wash hands frequently if touching lips/after eating.  Try to get 7-8 hours sleep per night. Regular meals with fruits/vegetables/protein/dairy. Avoid sunburn as these can trigger oral herpes outbreaks also. Exitcare handout on cold sores.  Patient verbalized understanding information/instructions, agreed with plan of care and had no further questions at this time.  Schedule neurology follow up and notify PCM you had ER visit also.

## 2018-04-26 ENCOUNTER — Ambulatory Visit (INDEPENDENT_AMBULATORY_CARE_PROVIDER_SITE_OTHER): Payer: No Typology Code available for payment source | Admitting: Physician Assistant

## 2018-04-26 ENCOUNTER — Encounter: Payer: Self-pay | Admitting: Physician Assistant

## 2018-04-26 VITALS — BP 106/80 | HR 83 | Ht 62.0 in | Wt 181.4 lb

## 2018-04-26 DIAGNOSIS — I428 Other cardiomyopathies: Secondary | ICD-10-CM

## 2018-04-26 NOTE — Patient Instructions (Signed)
Medication Instructions:  Your physician recommends that you continue on your current medications as directed. Please refer to the Current Medication list given to you today.  If you need a refill on your cardiac medications before your next appointment, please call your pharmacy.   Lab work: None ordered  If you have labs (blood work) drawn today and your tests are completely normal, you will receive your results only by: Marland Kitchen MyChart Message (if you have MyChart) OR . A paper copy in the mail If you have any lab test that is abnormal or we need to change your treatment, we will call you to review the results.  Testing/Procedures: None ordered  Follow-Up: At [facility], you and your health needs are our priority.  As part of our continuing mission to provide you with exceptional heart care, we have created designated Provider Care Teams.  These Care Teams include your primary Cardiologist (physician) and Advanced Practice Providers (APPs -  Physician Assistants and Nurse Practitioners) who all work together to provide you with the care you need, when you need it. You will need a follow up appointment in:  4 months.  You may see Kristeen Miss, MD or one of the following Advanced Practice Providers on your designated Care Team: Tereso Newcomer, PA-C [place], [place] . Berton Bon, NP  Any Other Special Instructions Will Be Listed Below (If Applicable).

## 2018-04-26 NOTE — Progress Notes (Signed)
Cardiology Office Note    Date:  04/26/2018   ID:  Stephanie Banglicia Larsh, DOB 1985/12/31, MRN [id number]  PCP:  Sandford Craze'Sullivan, Melissa, NP  Cardiologist: Dr. Elease HashimotoNahser  Chief Complaint: Hospital follow up   History of Present Illness:   Stephanie Patrick is a 33 y.o. female with a hx of ADHD, migraines, depression, recurrent UTI and vitamin D deficiency presented for hospital follow up.   Admitted 03/2018 for left ureteral stone with obstruction s/p cystoscopy with ureteral stent placement.  Postoperatively she required pressors and intubation due to hypotension and hypoxia.  Echo showed reduced LVEF. This appears to have related to her acute urosepsis. Repeat echo showed normal LV function of 55%. Placed on BB for 3 months with plan to discontinued during follow up.   Recently seen in ER for headache along with some left-sided weakness. Work up revealed bulging disk. Pending outpatient neurology work up.   Here today for follow up.  She has stop Coreg due to low blood pressure.  She denies chest pain, shortness of breath,PND, syncope, lower extremity edema or melena.  She might have intermittent breathing issue at night however improved after cutting back on salt.  She is not exercising due to fatigue.  Past Medical History:  Diagnosis Date  . Acute pyelonephritis 03/2018  . Acute respiratory failure (HCC) 03/25/2018   Ventilatory  . ADHD 02/13/2017  . Anxiety   . Cardiomyopathy (HCC) 03/2018   03/18/2018 EF 30-35% improved to 50-55% on 03/25/2018  . Chicken pox   . Complication of anesthesia    Woke up after surgery, felt paralyzed but unable to speak, made very scared  . Depression   . E. coli sepsis (HCC) 03/2018  . Grade II diastolic dysfunction    noted on ECHO 03/19/2018 had improved on ECHO 03/25/2018  . Gram negative septic shock (HCC) 03/2018  . History of kidney stones 03/18/2018   4 mm stone within the distal left ureter  . Hydronephrosis 03/19/2018   Left hydronephrosis mild to  moderate left hydroureteronephrosis  . Migraines   . Pneumonia   . PONV (postoperative nausea and vomiting)   . Pulmonary edema 03/20/2018   Noted on CXR  . Urinary tract infection     Past Surgical History:  Procedure Laterality Date  . CYSTOSCOPY W/ URETERAL STENT PLACEMENT Left 03/18/2018   Procedure: CYSTOSCOPY WITH RETROGRADE PYELOGRAM/URETERAL STENT PLACEMENT;  Surgeon: Jerilee FieldEskridge, Matthew, MD;  Location: [facility];  Service: Urology;  Laterality: Left;  . CYSTOSCOPY/URETEROSCOPY/HOLMIUM LASER/STENT PLACEMENT Left 04/15/2018   Procedure: CYSTOSCOPY/URETEROSCOPY stent removal and stone extraction;  Surgeon: Jerilee FieldEskridge, Matthew, MD;  [facility];  Service: Urology;  Laterality: Left;  ONLY NEEDS 60 MIN  . WISDOM TOOTH EXTRACTION      Current Medications: Prior to Admission medications   Medication Sig Start Date End Date Taking? Authorizing Provider  amphetamine-dextroamphetamine (ADDERALL) 5 MG tablet Take 5 mg by mouth daily.    [provider]  nitrofurantoin (MACRODANTIN) 100 MG capsule  04/06/18   [provider]  psyllium (METAMUCIL SMOOTH TEXTURE) 58.6 % powder Take 1 packet by mouth 3 (three) times daily. Patient taking differently: Take 1 packet by mouth daily.  04/05/18   Sandford Craze'Sullivan, Melissa, NP  valACYclovir (VALTREX) 500 MG tablet Take 1 tablet (500 mg total) by mouth daily for 30 days. 04/20/18 05/20/18  Betancourt, Jarold Songina A, NP    Allergies:   Vicodin [hydrocodone-acetaminophen]   Social History   Socioeconomic History  . Marital status: Single  Spouse name: Not on file  . Number of children: Not on file  . Years of education: Not on file  . Highest education level: Not on file  Occupational History  . Not on file  Social Needs  . Financial resource strain: Not on file  . Food insecurity:    Worry: Not on file    Inability: Not on file  . Transportation needs:    Medical: Not on file    Non-medical: Not on file  Tobacco Use   . Smoking status: Never Smoker  . Smokeless tobacco: Never Used  Substance and Sexual Activity  . Alcohol use: Yes  . Drug use: No  . Sexual activity: Yes    Partners: Male    Birth control/protection: None  Lifestyle  . Physical activity:    Days per week: Not on file    Minutes per session: Not on file  . Stress: Not on file  Relationships  . Social connections:    Talks on phone: Not on file    Gets together: Not on file    Attends religious service: Not on file    Active member of club or organization: Not on file    Attends meetings of clubs or organizations: Not on file    Relationship status: Not on file  Other Topics Concern  . Not on file  Social History Narrative   Dad committed suicide   Mom in [place]   Enjoys reading.    Best friend lives here     Family History:  The patient's family history includes Breast cancer in her maternal grandmother; Diabetes in her maternal grandmother; Healthy in her mother; Hypertension in her maternal grandmother.   ROS:   Please see the history of present illness.    ROS All other systems reviewed and are negative.   PHYSICAL EXAM:   VS:  BP 106/80   Pulse 83   Ht 5\' 2"  (1.575 m)   Wt 181 lb 6.4 oz (82.3 kg)   LMP 04/03/2018 (Approximate)   SpO2 99%   BMI 33.18 kg/m    GEN: Well nourished, well developed, in no acute distress  HEENT: normal  Neck: no JVD, carotid bruits, or masses Cardiac: RRR; no murmurs, rubs, or gallops,no edema  Respiratory:  clear to auscultation bilaterally, normal work of breathing GI: soft, nontender, nondistended, + BS MS: no deformity or atrophy  Skin: warm and dry, no rash Neuro:  Alert and Oriented x 3, Strength and sensation are intact Psych: euthymic mood, full affect  Wt Readings from Last 3 Encounters:  04/26/18 181 lb 6.4 oz (82.3 kg)  04/18/18 176 lb (79.8 kg)  04/15/18 178 lb 11.2 oz (81.1 kg)      Studies/Labs Reviewed:   EKG:  EKG is not  ordered today.    Recent  Labs: 04/01/2018: Magnesium 2.1; TSH 1.810 04/05/2018: ALT 46 04/18/2018: B Natriuretic Peptide 28.9; BUN 13; Creatinine, Ser 0.83; Hemoglobin 14.3; Platelets 370; Potassium 3.7; Sodium 137   Lipid Panel    Component Value Date/Time   CHOL 272 (H) 04/01/2018 1230   TRIG 193 (H) 04/01/2018 1230   HDL 30 (L) 04/01/2018 1230   CHOLHDL 9.1 (H) 04/01/2018 1230   CHOLHDL 4.2 02/13/2017 1525   LDLCALC 203 (H) 04/01/2018 1230   LDLCALC 90 02/13/2017 1525    Additional studies/ records that were reviewed today include:   Echocardiogram: 03/25/2018 Study Conclusions  - Left ventricle: The cavity size was normal. Systolic function was  normal. The estimated ejection fraction was in the range of 50%   to 55%. Wall motion was normal; there were no regional wall   motion abnormalities. The study is not technically sufficient to   allow evaluation of LV diastolic function. - Pericardium, extracardiac: A trivial pericardial effusion was   identified posterior to the heart. Features were not consistent   with tamponade physiology.  Impressions:  - Improved cardiac windows on this study. EF lower end of normal.   No significant valve disease.  Echo 03/19/2018 Study Conclusions  - Left ventricle: The cavity size was normal. Wall thickness was   normal. Systolic function was moderately to severely reduced. The   estimated ejection fraction was in the range of 30% to 35%.   Images were inadequate for LV wall motion assessment. Features   are consistent with a pseudonormal left ventricular filling   pattern, with concomitant abnormal relaxation and increased   filling pressure (grade 2 diastolic dysfunction). - Aortic valve: There was no stenosis. - Mitral valve: There was mild regurgitation. - Right ventricle: The cavity size was normal. Systolic function   was mildly reduced. - Tricuspid valve: Peak RV-RA gradient (S): 21 mm Hg. - Pulmonary arteries: PA peak pressure: 29 mm Hg (S). -  Systemic veins: IVC measured 1.9 cm with < 50% respirophasic   variation, suggesting RA pressure 8 mmHg.  Impressions:  - Very poor quality echo even with Definity. LV EF is estimated in   30-35% range but would suggest TEE or cardiac MRI to get better   view. Moderate diastolic dysfunction. Normal RV size wtih mildly   decreased systolic function. Mild mitral regurgitation.   ASSESSMENT & PLAN:    1. Transient cardiomyopathy/systolic CHF - Her EF was EF 30-35%, Likely due to due to sepsis syndrome treated with massive volume and required intubation and pressors. - Placed on BB with improved LVEF by repeat limited echo.  -She is no longer on Coreg due to softer blood pressure.  Patient is euvolemic. -will not restart Coreg for 3 months as recommended due to soft low blood pressure and fatigue.  Suspicious  her fatigue is most likely due to deconditioning rather than heart issue.  Advised gradually building exercise program.  Reviewed heart failure symptoms in detail.  Low-sodium diet.  Will reevaluate in few months and probably PRN follow-up afterwards.    Medication Adjustments/Labs and Tests Ordered: Current medicines are reviewed at length with the patient today.  Concerns regarding medicines are outlined above.  Medication changes, Labs and Tests ordered today are listed in the Patient Instructions below. Patient Instructions  Medication Instructions:  Your physician recommends that you continue on your current medications as directed. Please refer to the Current Medication list given to you today.  If you need a refill on your cardiac medications before your next appointment, please call your pharmacy.   Lab work: None ordered  If you have labs (blood work) drawn today and your tests are completely normal, you will receive your results only by: Marland Kitchen MyChart Message (if you have MyChart) OR . A paper copy in the mail If you have any lab test that is abnormal or we need to  change your treatment, we will call you to review the results.  Testing/Procedures: None ordered  Follow-Up: At [facility], you and your health needs are our priority.  As part of our continuing mission to provide you with exceptional heart care, we have created designated Provider Care Teams.  These  Care Teams include your primary Cardiologist (physician) and Advanced Practice Providers (APPs -  Physician Assistants and Nurse Practitioners) who all work together to provide you with the care you need, when you need it. You will need a follow up appointment in:  4 months.  You may see Kristeen Miss, MD or one of the following Advanced Practice Providers on your designated Care Team: Tereso Newcomer, PA-C [place], [place] . Berton Bon, NP  Any Other Special Instructions Will Be Listed Below (If Applicable).      Vonzella Nipple [place], [place]  04/26/2018 8:30 AM    [facility] [address] [place], [place], [place]  [address] Phone: [phone]; Fax: [phone]

## 2018-04-27 ENCOUNTER — Ambulatory Visit: Payer: No Typology Code available for payment source | Admitting: Family

## 2018-04-27 ENCOUNTER — Telehealth: Payer: Self-pay | Admitting: Family

## 2018-04-27 ENCOUNTER — Ambulatory Visit: Payer: Self-pay | Admitting: *Deleted

## 2018-04-27 VITALS — BP 99/77 | HR 101 | Temp 99.5°F

## 2018-04-27 DIAGNOSIS — R7989 Other specified abnormal findings of blood chemistry: Secondary | ICD-10-CM

## 2018-04-27 DIAGNOSIS — R945 Abnormal results of liver function studies: Principal | ICD-10-CM

## 2018-04-27 NOTE — Progress Notes (Signed)
Pt requesting for f/u from pcp OV to be performed in clinic to avoid additional time off work. Mesg sent to pcp yesterday by RN as appt was set for today but pt cancelled and no notes or orders could be located regarding f/u labs. Per pcp NP Stephanie Patrick, okay to collect CMP at clinic for now since abnormal ALT and platelets were previously trending down or already corrected. If abnormal on repeat, will f/u again with pcp.

## 2018-04-27 NOTE — Telephone Encounter (Signed)
-----   Message from Sandford Craze, NP sent at 04/27/2018  9:46 AM EST ----- Regarding: FW: Recheck Labs  ----- Message ----- From: Sandford Craze, NP Sent: 04/26/2018   2:43 PM EST To: Loraine Grip, RN Subject: RE: Recheck Labs                               I think follow up CMET will do it if you could please draw. Tks! 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 She says that she was supposed to see you tomorrow for a f/u on her labs. She's wanting to know if that f/u requires an office visit if we can get the labs at work. Looks like she has already cancelled the appointment actually.   I'm happy to draw the labs if that is okay with you, but I'm not seeing any pending orders or notes regarding f/u. Her platelets are now WNL and ALT was trending down. Do you want another CMP tomorrow? We also do an extensive lab panel for an insurance premium discount program and Rilynne will be due for that in April, so everything will be repeated then as well.   Please let me know what your thoughts are, and if appropriate to proceed, I will get [facility] scheduled. If not, I'll have her contact your office to reschedule.   Thank you! Nance Pew BSN, Museum/gallery conservator, [employer]. [employer] [facility] [employer] [facility]

## 2018-04-28 LAB — COMPREHENSIVE METABOLIC PANEL
ALT: 34 IU/L — ABNORMAL HIGH (ref 0–32)
AST: 27 IU/L (ref 0–40)
Albumin/Globulin Ratio: 1.9 (ref 1.2–2.2)
Albumin: 4.8 g/dL (ref 3.8–4.8)
Alkaline Phosphatase: 87 IU/L (ref 39–117)
BUN/Creatinine Ratio: 9 (ref 9–23)
BUN: 7 mg/dL (ref 6–20)
Bilirubin Total: 0.6 mg/dL (ref 0.0–1.2)
CHLORIDE: 98 mmol/L (ref 96–106)
CO2: 21 mmol/L (ref 20–29)
Calcium: 9.8 mg/dL (ref 8.7–10.2)
Creatinine, Ser: 0.82 mg/dL (ref 0.57–1.00)
GFR calc Af Amer: 109 mL/min/{1.73_m2} (ref >59)
GFR calc non Af Amer: 95 mL/min/{1.73_m2} (ref >59)
Globulin, Total: 2.5 g/dL (ref 1.5–4.5)
Glucose: 100 mg/dL — ABNORMAL HIGH (ref 65–99)
Potassium: 4.3 mmol/L (ref 3.5–5.2)
Sodium: 139 mmol/L (ref 134–144)
Total Protein: 7.3 g/dL (ref 6.0–8.5)

## 2018-05-04 NOTE — Progress Notes (Signed)
Noted and had reviewed results in Epic

## 2018-05-04 NOTE — Progress Notes (Signed)
PCP reviewed CMP and wrote note to pt stating liver function continues to improve. CMP was all that was requested at this time. As long as this was improving, pt could plan to f/u prn with pcp. Pt has viewed pcp's MyChart notes already.

## 2018-05-11 NOTE — Progress Notes (Deleted)
NEUROLOGY CONSULTATION NOTE  Stephanie Patrick MRN: [id number] DOB: [birth year]  Referring provider: Roxy Horseman, PA-C (ED referral) Primary care provider: Sandford Craze, NP  Reason for consult:  Headache  HISTORY OF PRESENT ILLNESS: Stephanie Patrick is a 33 year old ***-handed woman with cardiomyopathy, diastolic heart failure, severe anxiety and recent hospital admission in January 2020 for gram-negative sepsis requiring intubation and pressors following ureter stent placement for obstructed kidney stone who presents for headache.  History supplemented by ED and PCP notes.  On 04/15/18, she had a ureter stent removed.  The next day, she developed a gradual onset persistent *** diffuse headache.  Over the next couple of days, the headache worsened and she presented to the [facility] ED with associated left sided weakness ***,  unsteady gait, shortness of breath and chest pain.  She denied associated numbness and tingling or visual disturbance.  CT head was personally reviewed and was negative for acute process.  CT Chest was negative for PE.  Troponins were negative.  She was transferred to [facility] ED for further workup.  MRI of brain was personally reviewed and was unremarkable.  MRI of cervical spine was personally reviewed and demonstrated mild left eccentric disc bulge with unconvertebral hypertrophy at C3-4 with mild left C4 foraminal narrowing but otherwise unremarkable.  She was given IV Depakene 500mg  and discharged with instructions to follow up with outpatient neurology.    04/18/18 CBC:  WBC 10.3, HGB 14.3, HCT 43.7, PLT 370 04/27/18 CMP:  Na 139, K 4.3, Cl 98, CO2 21, glucose 100, BUN 7, Cr 0.82, t bili 0.6, ALP 87, AST 27, ALT 34.  PAST MEDICAL HISTORY: Past Medical History:  Diagnosis Date  . Acute pyelonephritis 03/2018  . Acute respiratory failure (HCC) 03/25/2018   Ventilatory  . ADHD 02/13/2017  . Anxiety   . Cardiomyopathy (HCC) 03/2018   03/18/2018 EF 30-35%  improved to 50-55% on 03/25/2018  . Chicken pox   . Complication of anesthesia    Woke up after surgery, felt paralyzed but unable to speak, made very scared  . Depression   . E. coli sepsis (HCC) 03/2018  . Grade II diastolic dysfunction    noted on ECHO 03/19/2018 had improved on ECHO 03/25/2018  . Gram negative septic shock (HCC) 03/2018  . History of kidney stones 03/18/2018   4 mm stone within the distal left ureter  . Hydronephrosis 03/19/2018   Left hydronephrosis mild to moderate left hydroureteronephrosis  . Migraines   . Pneumonia   . PONV (postoperative nausea and vomiting)   . Pulmonary edema 03/20/2018   Noted on CXR  . Urinary tract infection     PAST SURGICAL HISTORY: Past Surgical History:  Procedure Laterality Date  . CYSTOSCOPY W/ URETERAL STENT PLACEMENT Left 03/18/2018   Procedure: CYSTOSCOPY WITH RETROGRADE PYELOGRAM/URETERAL STENT PLACEMENT;  Surgeon: Jerilee Field, MD;  Location: [facility];  Service: Urology;  Laterality: Left;  . CYSTOSCOPY/URETEROSCOPY/HOLMIUM LASER/STENT PLACEMENT Left 04/15/2018   Procedure: CYSTOSCOPY/URETEROSCOPY stent removal and stone extraction;  Surgeon: Jerilee Field, MD;  Location: [facility];  Service: Urology;  Laterality: Left;  ONLY NEEDS 60 MIN  . WISDOM TOOTH EXTRACTION      MEDICATIONS: Current Outpatient Medications on File Prior to Visit  Medication Sig Dispense Refill  . amphetamine-dextroamphetamine (ADDERALL) 5 MG tablet Take 5 mg by mouth daily.    . psyllium (METAMUCIL SMOOTH TEXTURE) 58.6 % powder Take 1 packet by mouth 3 (three) times daily. 283 g 12  .  valACYclovir (VALTREX) 500 MG tablet Take 1 tablet (500 mg total) by mouth daily for 30 days. 30 tablet 0   No current facility-administered medications on file prior to visit.     ALLERGIES: Allergies  Allergen Reactions  . Vicodin [Hydrocodone-Acetaminophen] Nausea And Vomiting    FAMILY HISTORY: Family History  Problem Relation Age  of Onset  . Healthy Mother   . Diabetes Maternal Grandmother   . Hypertension Maternal Grandmother   . Breast cancer Maternal Grandmother    SOCIAL HISTORY: Social History   Socioeconomic History  . Marital status: Single    Spouse name: Not on file  . Number of children: Not on file  . Years of education: Not on file  . Highest education level: Not on file  Occupational History  . Not on file  Social Needs  . Financial resource strain: Not on file  . Food insecurity:    Worry: Not on file    Inability: Not on file  . Transportation needs:    Medical: Not on file    Non-medical: Not on file  Tobacco Use  . Smoking status: Never Smoker  . Smokeless tobacco: Never Used  Substance and Sexual Activity  . Alcohol use: Yes  . Drug use: No  . Sexual activity: Yes    Partners: Male    Birth control/protection: None  Lifestyle  . Physical activity:    Days per week: Not on file    Minutes per session: Not on file  . Stress: Not on file  Relationships  . Social connections:    Talks on phone: Not on file    Gets together: Not on file    Attends religious service: Not on file    Active member of club or organization: Not on file    Attends meetings of clubs or organizations: Not on file    Relationship status: Not on file  . Intimate partner violence:    Fear of current or ex partner: Not on file    Emotionally abused: Not on file    Physically abused: Not on file    Forced sexual activity: Not on file  Other Topics Concern  . Not on file  Social History Narrative   Dad committed suicide   Mom in [place]   Enjoys reading.    Best friend lives here    REVIEW OF SYSTEMS: Constitutional: No fevers, chills, or sweats, no generalized fatigue, change in appetite Eyes: No visual changes, double vision, eye pain Ear, nose and throat: No hearing loss, ear pain, nasal congestion, sore throat Cardiovascular: No chest pain, palpitations Respiratory:  No shortness of  breath at rest or with exertion, wheezes GastrointestinaI: No nausea, vomiting, diarrhea, abdominal pain, fecal incontinence Genitourinary:  No dysuria, urinary retention or frequency Musculoskeletal:  No neck pain, back pain Integumentary: No rash, pruritus, skin lesions Neurological: as above Psychiatric: No depression, insomnia, anxiety Endocrine: No palpitations, fatigue, diaphoresis, mood swings, change in appetite, change in weight, increased thirst Hematologic/Lymphatic:  No purpura, petechiae. Allergic/Immunologic: no itchy/runny eyes, nasal congestion, recent allergic reactions, rashes  PHYSICAL EXAM: *** General: No acute distress.  Patient appears ***-groomed.  *** Head:  Normocephalic/atraumatic Eyes:  fundi examined but not visualized Neck: supple, no paraspinal tenderness, full range of motion Back: No paraspinal tenderness Heart: regular rate and rhythm Lungs: Clear to auscultation bilaterally. Vascular: No carotid bruits. Neurological Exam: Mental status: alert and oriented to person, place, and time, recent and remote memory intact, fund of knowledge intact,  attention and concentration intact, speech fluent and not dysarthric, language intact. Cranial nerves: CN I: not tested CN II: pupils equal, round and reactive to light, visual fields intact CN III, IV, VI:  full range of motion, no nystagmus, no ptosis CN V: facial sensation intact CN VII: upper and lower face symmetric CN VIII: hearing intact CN IX, X: gag intact, uvula midline CN XI: sternocleidomastoid and trapezius muscles intact CN XII: tongue midline Bulk & Tone: normal, no fasciculations. Motor:  5/5 throughout *** Sensation:  Pinprick *** temperature *** and vibration sensation intact.  ***. Deep Tendon Reflexes:  2+ throughout, *** toes downgoing.  *** Finger to nose testing:  Without dysmetria.  *** Heel to shin:  Without dysmetria.  *** Gait:  Normal station and stride.  Able to turn and tandem  walk. Romberg ***.  IMPRESSION: ***  PLAN: ***  Thank you for allowing me to take part in the care of this patient.  Shon Millet, DO  CC: ***

## 2018-05-12 ENCOUNTER — Ambulatory Visit: Payer: Self-pay | Admitting: Neurology

## 2018-05-12 ENCOUNTER — Encounter: Payer: Self-pay | Admitting: Neurology

## 2018-06-08 ENCOUNTER — Other Ambulatory Visit: Payer: Self-pay

## 2018-06-08 ENCOUNTER — Ambulatory Visit (INDEPENDENT_AMBULATORY_CARE_PROVIDER_SITE_OTHER): Payer: PRIVATE HEALTH INSURANCE | Admitting: Family

## 2018-06-08 DIAGNOSIS — T23051A Burn of unspecified degree of right palm, initial encounter: Secondary | ICD-10-CM | POA: Diagnosis not present

## 2018-06-08 DIAGNOSIS — T300 Burn of unspecified body region, unspecified degree: Secondary | ICD-10-CM

## 2018-06-08 DIAGNOSIS — T23021A Burn of unspecified degree of single right finger (nail) except thumb, initial encounter: Secondary | ICD-10-CM

## 2018-06-08 MED ORDER — SILVER SULFADIAZINE 1 % EX CREA: 1.0000 "application " | TOPICAL_CREAM | Freq: Every day | CUTANEOUS | 0 refills | Status: DC

## 2018-06-08 MED ORDER — TRAMADOL HCL 50 MG PO TABS: 50.0000 mg | ORAL_TABLET | Freq: Three times a day (TID) | ORAL | 0 refills | Status: DC | PRN

## 2018-06-08 NOTE — Progress Notes (Signed)
Virtual Visit via Video Note  I connected with Stephanie Patrick  on 06/08/18 at  3:40 PM EDT by a video enabled telemedicine application and verified that I am speaking with the correct person using two identifiers. This visit type was conducted due to national recommendations for restrictions regarding the COVID-19 Pandemic (e.g. social distancing).  This format is felt to be most appropriate for this patient at this time.   I discussed the limitations of evaluation and management by telemedicine and the availability of in person appointments. The patient expressed understanding and agreed to proceed.  Only the patient and myself were on today's video visit. The patient was at home and I was in my office at the time of today's visit.   History of Present Illness:  Reports that she reached and grabbed a hot tray of roasted veggies covered in olive oil today. She burned her right hand badly and is having a lot of pain. She immediately put her hand under cold water but notes that the only thing that helps the pain is application of ice.   Anxiety- [facility] started lexapro. She reports that her anxiety symptoms seem to be improving on lexapro and she has a follow up visit planned.    Observations/Objective:   Gen: Awake, alert, no acute distress Resp: Breathing is even and non-labored Psych: calm/pleasant demeanor Neuro: Alert and Oriented x 3, + facial symmetry, speech is clear. + blistering burn noted on right palm and right middle finger (palmar surface)  Assessment and Plan:  Burn- advised pt to begin applying silvadene ointment. Advised that she apply clean guaze dressing and cover with coban.  For pain may apply ice prn and oral tramadol.    Follow Up Instructions:   I discussed the assessment and treatment plan with the patient. The patient was provided an opportunity to ask questions and all were answered. The patient agreed with the plan and  demonstrated an understanding of the instructions.   The patient was advised to call back or seek an in-person evaluation if the symptoms worsen or if the condition fails to improve as anticipated.    Lemont Fillers, NP

## 2018-06-10 ENCOUNTER — Ambulatory Visit: Payer: Self-pay

## 2018-06-10 ENCOUNTER — Ambulatory Visit (INDEPENDENT_AMBULATORY_CARE_PROVIDER_SITE_OTHER): Payer: PRIVATE HEALTH INSURANCE | Admitting: Family Medicine

## 2018-06-10 ENCOUNTER — Other Ambulatory Visit: Payer: Self-pay

## 2018-06-10 ENCOUNTER — Encounter: Payer: Self-pay | Admitting: Family Medicine

## 2018-06-10 DIAGNOSIS — J301 Allergic rhinitis due to pollen: Secondary | ICD-10-CM

## 2018-06-10 MED ORDER — FLUTICASONE PROPIONATE 50 MCG/ACT NA SUSP: 2.0000 | Freq: Every day | NASAL | 6 refills | Status: DC

## 2018-06-10 MED ORDER — LEVOCETIRIZINE DIHYDROCHLORIDE 5 MG PO TABS: 5.0000 mg | ORAL_TABLET | Freq: Every evening | ORAL | 2 refills | Status: DC

## 2018-06-10 NOTE — Progress Notes (Addendum)
Virtual Visit via Video Note  I connected with patient on 06/10/18 at  1:15 PM EDT by a video enabled telemedicine application and verified that I am speaking with the correct person using two identifiers.   I discussed the limitations of evaluation and management by telemedicine and the availability of in person appointments. The patient expressed understanding and agreed to proceed.  History of Present Illness: Several days of allergies- headache, itchy/watery eyes, sneezing. Denies fevers, sob, ST, runny/stuffy nose, ear pain/drainage. No sick contacts or recent travel. Has used benadryl at home without relief.   Observations/Objective: No conversational dyspnea Age appropriate judgment and insight Nml affect and mood  Assessment and Plan: Seasonal allergic rhinitis due to pollen - Plan: levocetirizine (XYZAL) 5 MG tablet, fluticasone (FLONASE) 50 MCG/ACT nasal spray  Orders as above. Start tabs first, if no improvement add INCS.   Follow Up Instructions: PRN   I discussed the assessment and treatment plan with the patient. The patient was provided an opportunity to ask questions and all were answered. The patient agreed with the plan and demonstrated an understanding of the instructions.   The patient was advised to call back or seek an in-person evaluation if the symptoms worsen or if the condition fails to improve as anticipated.   Jilda Roche [place], DO

## 2018-06-10 NOTE — Telephone Encounter (Signed)
Patient called and says on Sunday night she slept with her window open and woke up Monday morning with a sore throat, cough, sneezing, red/watery eyes. She says the cough and sore throat went away with taking Benadryl, but this morning she woke up with body aches, no chills, headache. She says she's been at home working for the past 3 weeks and only went out to pick up groceries through the drive through, otherwise, everything is delivered. I placed her on hold while I call the office. I called and spoke to Kite, [facility] who asks to speak to the patient, the call connected and transferred successfully.  Marland Kitchen  Answer Assessment - Initial Assessment Questions 1. SYMPTOM: "What's the main symptom you're concerned about?" (e.g., runny nose, stuffiness, sneezing, itching)     Body aches, eyes watery and itchy, sneezing 2. SEVERITY: "How bad is it?" "What does it keep you from doing?" (e.g., sleeping, working)      Right now head hurts and sneezing, body aches 3. EYES: "Are the eyes also red, watery, and itchy?"      Yes 4. TRIGGER: "What pollen or other allergic substance do you think is causing the symptoms?"      Slept with the window open on Sunday night 5. TREATMENT: "What medicine are you using?" "What medicine worked best in the past?"     Benadryl 6. OTHER SYMPTOMS: "Do you have any other symptoms?" (e.g., coughing, difficulty breathing, wheezing)     Body aches, headache 7. PREGNANCY: "Is there any chance you are pregnant?" "When was your last menstrual period?"     No  Protocols used: NASAL ALLERGIES (HAY FEVER)-A-AH

## 2018-06-16 ENCOUNTER — Other Ambulatory Visit: Payer: Self-pay

## 2018-06-16 ENCOUNTER — Ambulatory Visit (INDEPENDENT_AMBULATORY_CARE_PROVIDER_SITE_OTHER): Payer: PRIVATE HEALTH INSURANCE | Admitting: Family

## 2018-06-16 DIAGNOSIS — B349 Viral infection, unspecified: Secondary | ICD-10-CM | POA: Diagnosis not present

## 2018-06-16 DIAGNOSIS — N3000 Acute cystitis without hematuria: Secondary | ICD-10-CM | POA: Diagnosis not present

## 2018-06-16 DIAGNOSIS — T23051D Burn of unspecified degree of right palm, subsequent encounter: Secondary | ICD-10-CM | POA: Diagnosis not present

## 2018-06-16 DIAGNOSIS — T300 Burn of unspecified body region, unspecified degree: Secondary | ICD-10-CM

## 2018-06-16 NOTE — Progress Notes (Signed)
Virtual Visit via Video Note  I connected with Stephanie Patrick on 06/16/18 at 10:40 AM EDT by a video enabled telemedicine application and verified that I am speaking with the correct person using two identifiers. This visit type was conducted due to national recommendations for restrictions regarding the COVID-19 Pandemic (e.g. social distancing).  This format is felt to be most appropriate for this patient at this time.   I discussed the limitations of evaluation and management by telemedicine and the availability of in person appointments. The patient expressed understanding and agreed to proceed.  Only the patient and myself were on today's video visit. The patient was at home and I was in my office at the time of today's visit.   History of Present Illness:  Patient is a 33 yr old female who presents today with chief complaint of body aches Reports associated nasal congestion, cough and runny nose. Reports that body aches began about 4 days ago.    Reports that burn is healing well. No longer having pain.   Reports that she is having allergy symptoms. She is using generic flonase.  Still having congestion.  She is walking more and exercising more.  Worried that she is coming down with something. Reports that she does not have a thermometer but does not feel like she has a fever.  Does not have a sneezing, scratchy/irritated throat- especially in the AM.   Body aches started 4 days ago.    Last night had bladder pain.  Notes +frequency of urination.   Had heavy period last week, 10 days early.  Lots of clots.    Observations/Objective:   Gen: Awake, alert, no acute distress Resp: Breathing is even and non-labored Psych: calm/pleasant demeanor Neuro: Alert and Oriented x 3, + facial symmetry, speech is clear. Skin:  Burn on palmar surface of right hand appears to be dry and healing well. Other burns on fingers nearly resolved.  Assessment and Plan:  UTI- advised pt to begin macrobid 100mg   bid x 5 days. She has pills on hand at home.  Viral illness- suspect mild viral syndrome. We discussed that it is unlikely Covid-10  Burn- healing well. Continue to monitor.   Follow Up Instructions:    I discussed the assessment and treatment plan with the patient. The patient was provided an opportunity to ask questions and all were answered. The patient agreed with the plan and demonstrated an understanding of the instructions.   The patient was advised to call back or seek an in-person evaluation if the symptoms worsen or if the condition fails to improve as anticipated.    Lemont Fillers, NP

## 2018-06-18 ENCOUNTER — Ambulatory Visit: Payer: Self-pay | Admitting: Neurology

## 2018-07-13 ENCOUNTER — Other Ambulatory Visit: Payer: Self-pay

## 2018-07-13 ENCOUNTER — Telehealth (INDEPENDENT_AMBULATORY_CARE_PROVIDER_SITE_OTHER): Payer: PRIVATE HEALTH INSURANCE | Admitting: Family

## 2018-07-13 ENCOUNTER — Encounter: Payer: Self-pay | Admitting: Family

## 2018-07-13 VITALS — HR 77 | Wt 170.2 lb

## 2018-07-13 DIAGNOSIS — S161XXA Strain of muscle, fascia and tendon at neck level, initial encounter: Secondary | ICD-10-CM | POA: Diagnosis not present

## 2018-07-13 DIAGNOSIS — M546 Pain in thoracic spine: Secondary | ICD-10-CM | POA: Diagnosis not present

## 2018-07-13 MED ORDER — MELOXICAM 7.5 MG PO TABS: 7.5000 mg | ORAL_TABLET | Freq: Every day | ORAL | 0 refills | Status: DC

## 2018-07-13 NOTE — Progress Notes (Signed)
Virtual Visit via Video Note  I connected with Stephanie Patrick on 07/13/18 at  1:40 PM EDT by a video enabled telemedicine application and verified that I am speaking with the correct person using two identifiers. This visit type was conducted due to national recommendations for restrictions regarding the COVID-19 Pandemic (e.g. social distancing).  This format is felt to be most appropriate for this patient at this time.   I discussed the limitations of evaluation and management by telemedicine and the availability of in person appointments. The patient expressed understanding and agreed to proceed.  Only the patient and myself were on today's video visit. The patient was at home and I was in my office at the time of today's visit.   History of Present Illness:  She presents today with chief complaint of neck pain and thoracic back pain.  Reports that she is seeing a chiropractor once a week. Reports that she has been taking tylenol without relief.   Feels that "even her arms are hurting." Pain is located between her shoulder blades as well as muscle tension in her neck.  She reports that she had numbness on Monday in her forearms and hands.  Reports that she has been working from home and her chair is not very ergonomic. Notes that she ordered a new chair but it is not due to ship until June.   Observations/Objective:   Gen: Awake, alert, no acute distress Resp: Breathing is even and non-labored Psych: calm/pleasant demeanor Neuro: Alert and Oriented x 3, + facial symmetry, speech is clear.   Assessment and Plan:  Neck strain/thoracic back pain- trial of meloxicam. She wishes to avoid narcotics and muscle relaxers. If symptoms fail to improve we discussed rx with medrol dose pak. Continue stretching and chiropractic. Pt is advised to work on Psychiatrist at her desk, and to call if symptoms worsen or if they fail to improve.  Follow Up Instructions:    I discussed the  assessment and treatment plan with the patient. The patient was provided an opportunity to ask questions and all were answered. The patient agreed with the plan and demonstrated an understanding of the instructions.   The patient was advised to call back or seek an in-person evaluation if the symptoms worsen or if the condition fails to improve as anticipated.    Lemont Fillers, NP

## 2018-07-16 ENCOUNTER — Encounter: Payer: Self-pay | Admitting: Family

## 2018-07-16 MED ORDER — METHYLPREDNISOLONE 4 MG PO TBPK: ORAL_TABLET | ORAL | 0 refills | Status: DC

## 2018-08-03 ENCOUNTER — Telehealth: Payer: Self-pay | Admitting: *Deleted

## 2018-08-03 NOTE — Telephone Encounter (Signed)
Lvm for pt to call back about appointment set up

## 2018-08-09 ENCOUNTER — Ambulatory Visit: Payer: Self-pay | Admitting: Physician Assistant

## 2018-10-13 ENCOUNTER — Telehealth: Payer: PRIVATE HEALTH INSURANCE | Admitting: Family

## 2018-10-13 DIAGNOSIS — Z20822 Contact with and (suspected) exposure to covid-19: Secondary | ICD-10-CM

## 2018-10-13 MED ORDER — BENZONATATE 100 MG PO CAPS: 100.0000 mg | ORAL_CAPSULE | Freq: Three times a day (TID) | ORAL | 0 refills | Status: DC | PRN

## 2018-10-13 NOTE — Progress Notes (Signed)
E-Visit for Corona Virus Screening   Your current symptoms could be consistent with the coronavirus.  Many health care providers can now test patients at their office but not all are.  [facility] has multiple testing sites. For information on our COVID testing locations and hours go to [email or web address]  Please quarantine yourself while awaiting your test results.  We are enrolling you in our MyChart Home Montioring for COVID19 . Daily you will receive a questionnaire within the MyChart website. Our COVID 19 response team willl be monitoriing your responses daily.  You can go to one of the  testing sites listed below, while they are opened (see hours). You do not need an order and will stay in your car during the test. You do need to self isolate until your results return and if positive 14 days from when your symptoms started and until you are 3 days symptom free.   Testing Locations (Monday - Friday, 8 a.m. - 3:30 p.m.) . [place]: [facility], [address], [place], [place]  . [place]: [facility], [address], [place], [place] (entrance off [address])  . [place]: [address], [place], [place] (across from [facility])  Approximately 5 minutes was spent documenting and reviewing patient's chart.    COVID-19 is a respiratory illness with symptoms that are similar to the flu. Symptoms are typically mild to moderate, but there have been cases of severe illness and death due to the virus. The following symptoms may appear 2-14 days after exposure: . Fever . Cough . Shortness of breath or difficulty breathing . Chills . Repeated shaking with chills . Muscle pain . Headache . Sore throat . New loss of taste or smell . Fatigue . Congestion or runny nose . Nausea or vomiting . Diarrhea  It is vitally important that if you feel that you have an infection such  as this virus or any other virus that you stay home and away from places where you may spread it to others.  You should self-quarantine for 14 days if you have symptoms that could potentially be coronavirus or have been in close contact a with a person diagnosed with COVID-19 within the last 2 weeks. You should avoid contact with people age 65 and older.   You should wear a mask or cloth face covering over your nose and mouth if you must be around other people or animals, including pets (even at home). Try to stay at least 6 feet away from other people. This will protect the people around you.  You can use medication such as A prescription cough medication called Tessalon Perles 100 mg. You may take 1-2 capsules every 8 hours as needed for cough  You may also take acetaminophen (Tylenol) as needed for fever.   Reduce your risk of any infection by using the same precautions used for avoiding the common cold or flu:  . Wash your hands often with soap and warm water for at least 20 seconds.  If soap and water are not readily available, use an alcohol-based hand sanitizer with at least 60% alcohol.  . If coughing or sneezing, cover your mouth and nose by coughing or sneezing into the elbow areas of your shirt or coat, into a tissue or into your sleeve (not your hands). . Avoid shaking hands with others and consider head nods or verbal greetings only. . Avoid touching your eyes, nose, or mouth with   unwashed hands.  . Avoid close contact with people who are sick. . Avoid places or events with large numbers of people in one location, like concerts or sporting events. . Carefully consider travel plans you have or are making. . If you are planning any travel outside or inside the [place], visit the CDC's Travelers' Health webpage for the latest health notices. . If you have some symptoms but not all symptoms, continue to monitor at home and seek medical attention if your symptoms worsen. . If you are having a  medical emergency, call 911.  HOME CARE . Only take medications as instructed by your medical team. . Drink plenty of fluids and get plenty of rest. . A steam or ultrasonic humidifier can help if you have congestion.   GET HELP RIGHT AWAY IF YOU HAVE EMERGENCY WARNING SIGNS** FOR COVID-19. If you or someone is showing any of these signs seek emergency medical care immediately. Call 911 or proceed to your closest emergency facility if: . You develop worsening high fever. . Trouble breathing . Bluish lips or face . Persistent pain or pressure in the chest . New confusion . Inability to wake or stay awake . You cough up blood. . Your symptoms become more severe  **This list is not all possible symptoms. Contact your medical provider for any symptoms that are sever or concerning to you.   MAKE SURE YOU   Understand these instructions.  Will watch your condition.  Will get help right away if you are not doing well or get worse.  Your e-visit answers were reviewed by a board certified advanced clinical practitioner to complete your personal care plan.  Depending on the condition, your plan could have included both over the counter or prescription medications.  If there is a problem please reply once you have received a response from your provider.  Your safety is important to us.  If you have drug allergies check your prescription carefully.    You can use MyChart to ask questions about today's visit, request a non-urgent call back, or ask for a work or school excuse for 24 hours related to this e-Visit. If it has been greater than 24 hours you will need to follow up with your provider, or enter a new e-Visit to address those concerns. You will get an e-mail in the next two days asking about your experience.  I hope that your e-visit has been valuable and will speed your recovery. Thank you for using e-visits.    

## 2018-10-13 NOTE — Progress Notes (Signed)
See previous Evisit 

## 2018-10-26 NOTE — Progress Notes (Signed)
Patient employment terminated 09/24/2018 no longer eligible for care at [facility]

## 2018-10-26 NOTE — Progress Notes (Signed)
Patient employment terminated 09/24/2018 no longer eligible for care at [facility]

## 2018-12-08 ENCOUNTER — Encounter (HOSPITAL_COMMUNITY): Payer: Self-pay

## 2018-12-08 ENCOUNTER — Telehealth: Payer: PRIVATE HEALTH INSURANCE

## 2018-12-08 ENCOUNTER — Telehealth: Payer: PRIVATE HEALTH INSURANCE | Admitting: Family

## 2018-12-08 DIAGNOSIS — M7989 Other specified soft tissue disorders: Secondary | ICD-10-CM

## 2018-12-08 DIAGNOSIS — R509 Fever, unspecified: Secondary | ICD-10-CM

## 2018-12-08 NOTE — Progress Notes (Signed)
Based on what you shared with me, I feel your condition warrants further evaluation and I recommend that you be seen for a face to face office visit.  Given your symptoms of leg swelling this is not a common symptom of Covid and you need to be seen face-to-face.  NOTE: If you entered your credit card information for this eVisit, you will not be charged. You may see a "hold" on your card for the $35 but that hold will drop off and you will not have a charge processed.  If you are having a true medical emergency please call 911.     For an urgent face to face visit, [facility] has four urgent care centers for your convenience:   . [facility]    [phone]                  Get Driving Directions  [address], [place] [address] . 10 am to 8 pm Monday-Friday . 12 pm to 8 pm Saturday-Sunday   . [facility] at [place]                  Get Driving Directions  [address], [place], [place] [address] . 8 am to 8 pm Monday-Friday . 9 am to 6 pm Saturday . 11 am to 6 pm Sunday   . [facility] at [place]                  Get Driving Directions   [address], [place] [address] . 8 am to 8 pm Monday-Friday . 8 am to 4 pm Saturday-Sunday    . [facility] at [place]                    Get Driving Directions  [phone]  [address] [place], [place] [address]  . Monday-Friday, 12 PM to 6 PM    Your e-visit answers were reviewed by a board certified advanced clinical practitioner to complete your personal care plan.  Thank you for using e-Visits.

## 2018-12-22 ENCOUNTER — Telehealth: Payer: PRIVATE HEALTH INSURANCE

## 2019-04-11 ENCOUNTER — Ambulatory Visit: Payer: PRIVATE HEALTH INSURANCE | Attending: Internal Medicine

## 2019-04-11 DIAGNOSIS — Z20822 Contact with and (suspected) exposure to covid-19: Secondary | ICD-10-CM

## 2019-04-12 LAB — NOVEL CORONAVIRUS, NAA: SARS-CoV-2, NAA: NOT DETECTED

## 2019-05-11 ENCOUNTER — Ambulatory Visit: Payer: PRIVATE HEALTH INSURANCE | Attending: Internal Medicine

## 2019-05-11 ENCOUNTER — Other Ambulatory Visit: Payer: Self-pay

## 2019-05-11 ENCOUNTER — Ambulatory Visit: Payer: Self-pay

## 2019-05-11 DIAGNOSIS — Z20822 Contact with and (suspected) exposure to covid-19: Secondary | ICD-10-CM

## 2019-05-12 LAB — NOVEL CORONAVIRUS, NAA: SARS-CoV-2, NAA: NOT DETECTED

## 2019-05-15 IMAGING — CR DG CHEST 2V
2 series · 2 of 2 positions shown · non-contrast
Comparison: None

CLINICAL DATA: Abdominal and LEFT flank pain since this morning

EXAM:
CHEST - 2 VIEW

[w chest lat]
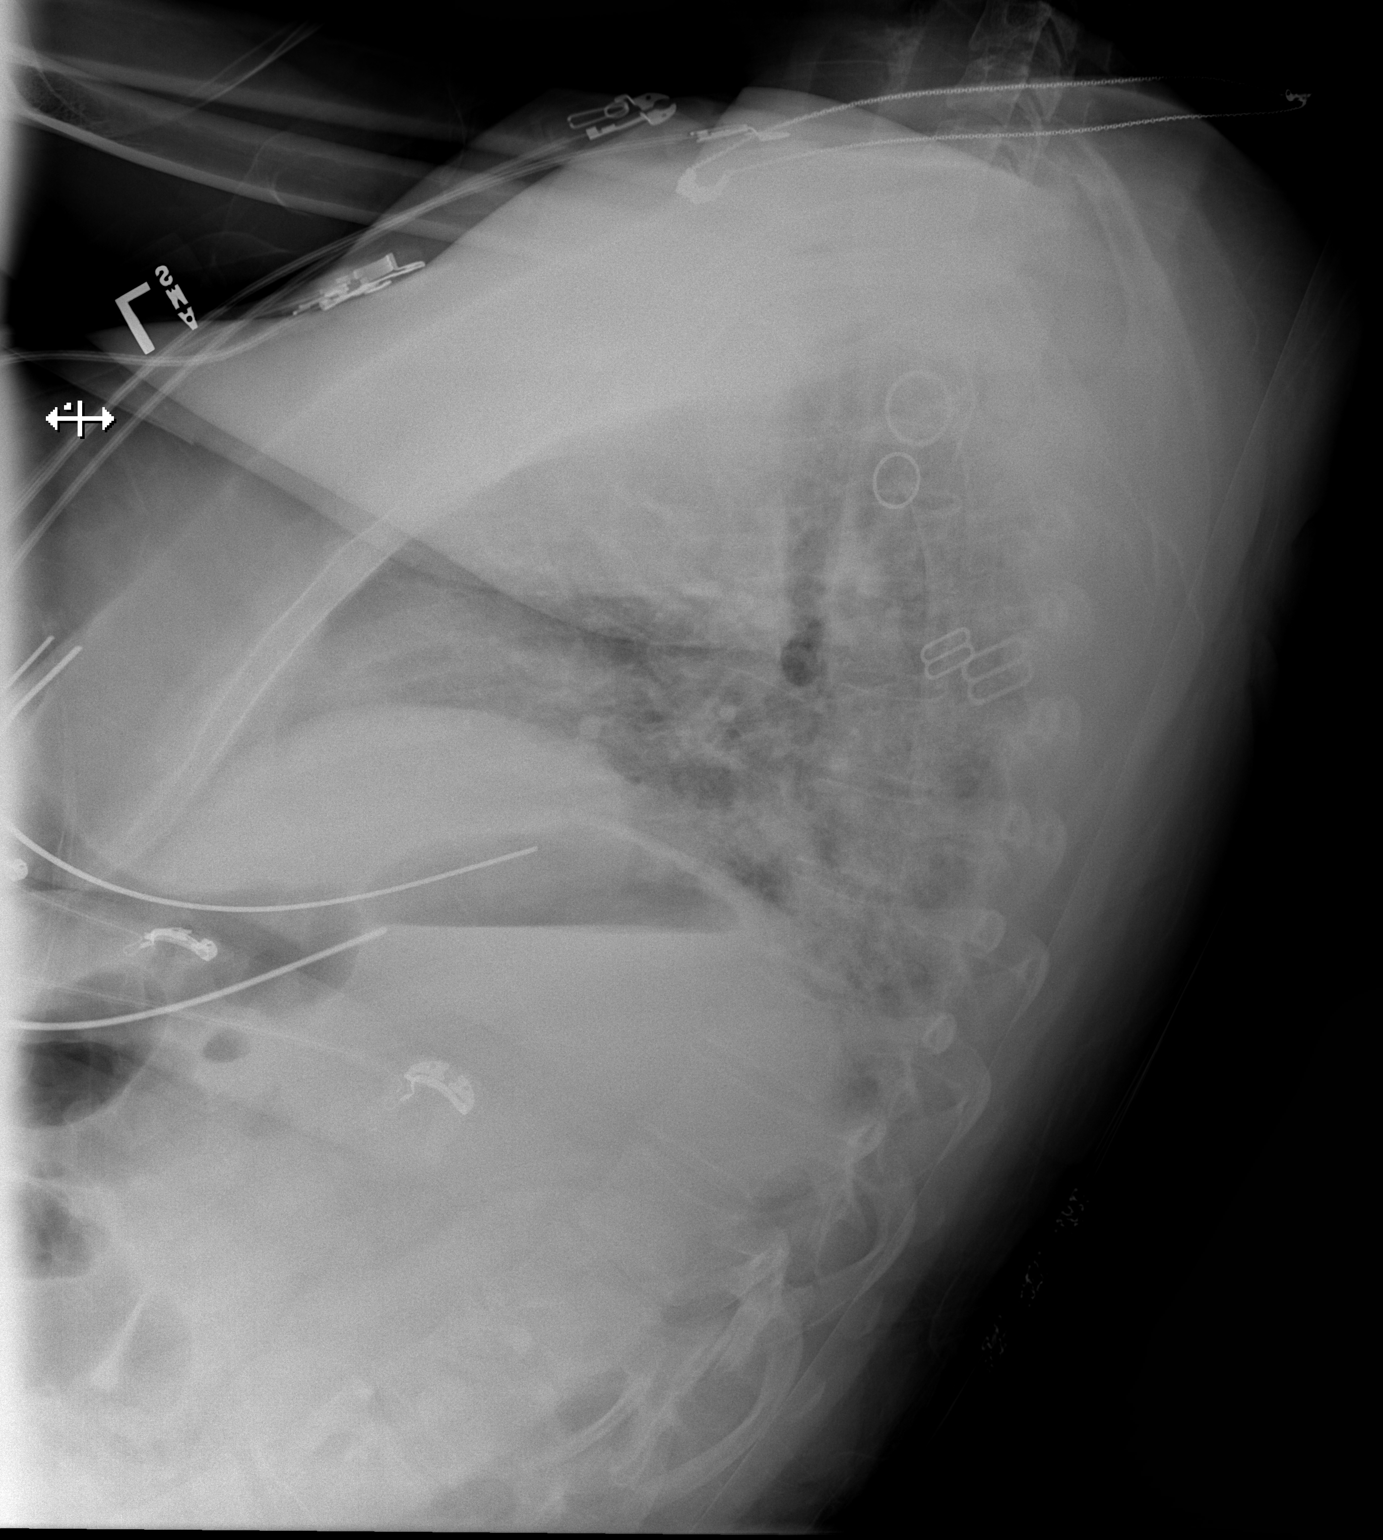

[x chest ap]
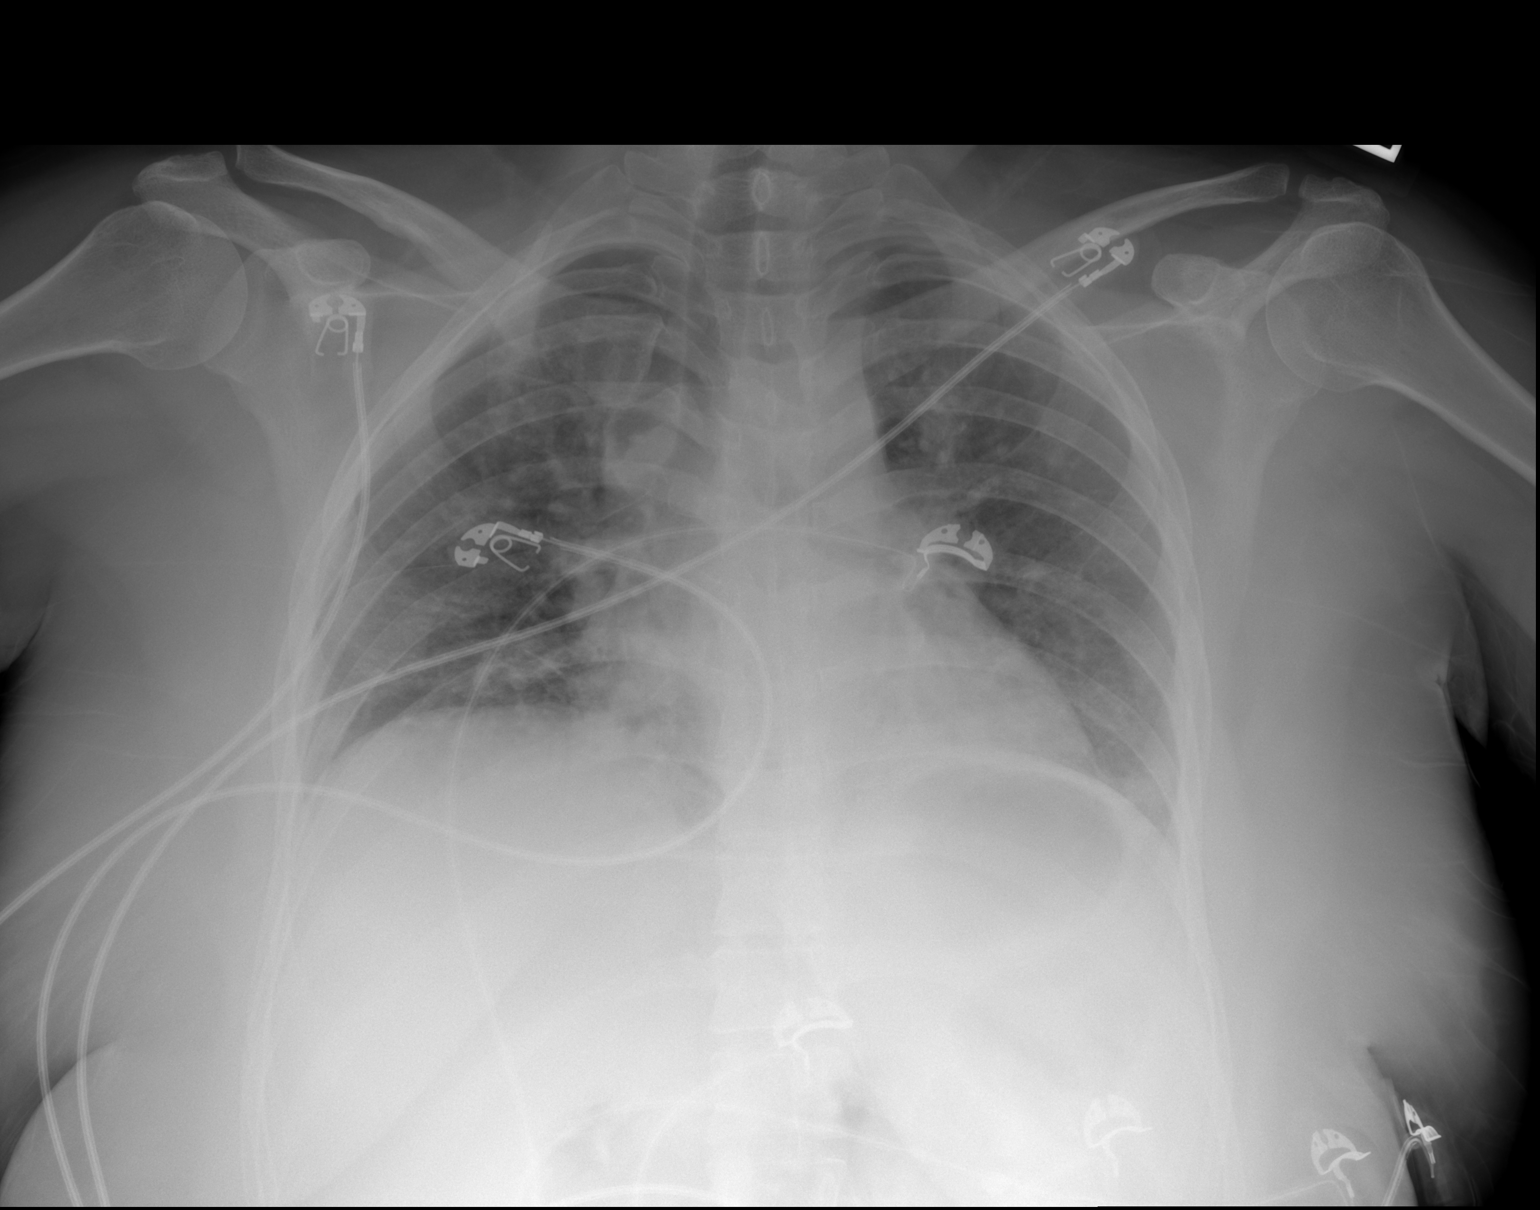

[2 of 2 positions shown; findings below may reference images not displayed]

FINDINGS: Borderline enlargement of cardiac silhouette.

Mediastinal contours and pulmonary vascularity normal.

RIGHT basilar atelectasis and generally low lung volumes.

Upper lungs clear.

No pneumothorax.

Bones unremarkable.
IMPRESSION: Low lung volumes with accentuated heart size and RIGHT basilar
atelectasis.

## 2019-06-15 IMAGING — CT CT ANGIO CHEST
2 of 6 series · 18 of 46 positions shown · IV contrast (ISOVUE)
Comparison: Chest radiographs earlier today.

CLINICAL DATA: 32-year-old female with headache for 2 days, left
chest pain, left upper extremity numbness.

EXAM:
CT ANGIOGRAPHY CHEST WITH CONTRAST
TECHNIQUE: Multidetector CT imaging of the chest was performed using the
standard protocol during bolus administration of intravenous
contrast. Multiplanar CT image reconstructions and MIPs were
obtained to evaluate the vascular anatomy.
CONTRAST:  100mL [id number] IOPAMIDOL ([id number]) INJECTION 76%

[Series 5: thins · axial · 0.65mm/px · z∈[-402,-176]mm · 15 of 248 slices shown]
[im 11/248  lung]
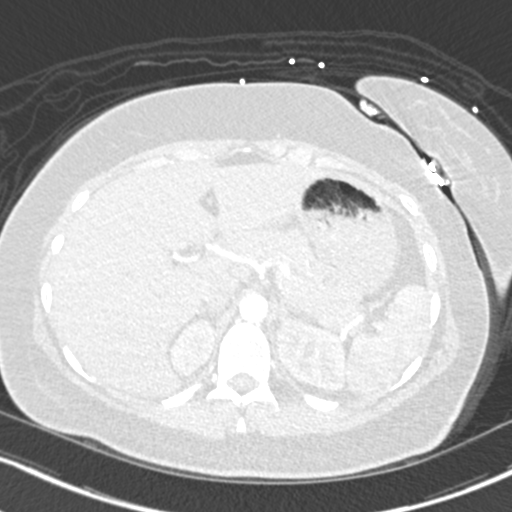
[im 33/248  soft-tissue]
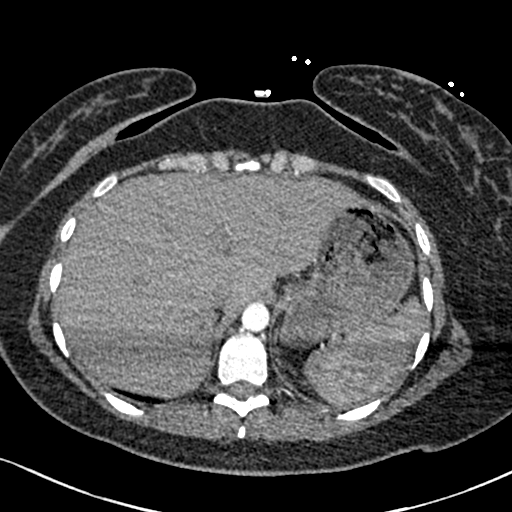
[im 43/248  lung]
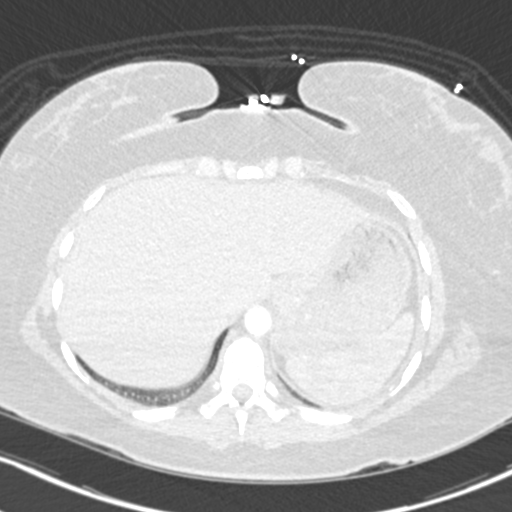
[im 65/248  soft-tissue]
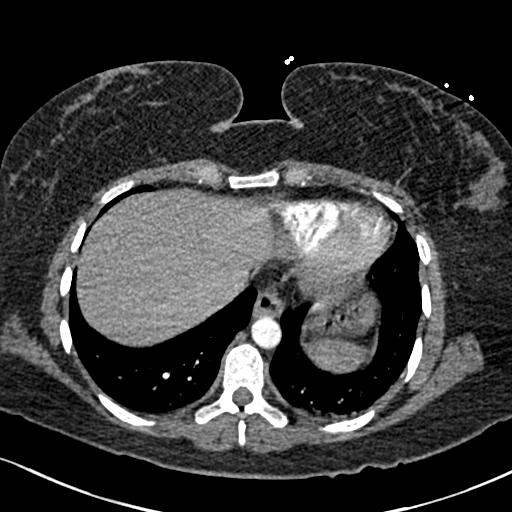
[im 76/248  lung]
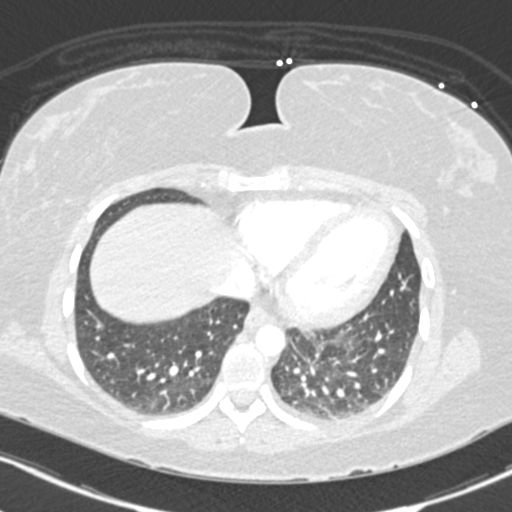
[im 97/248  soft-tissue]
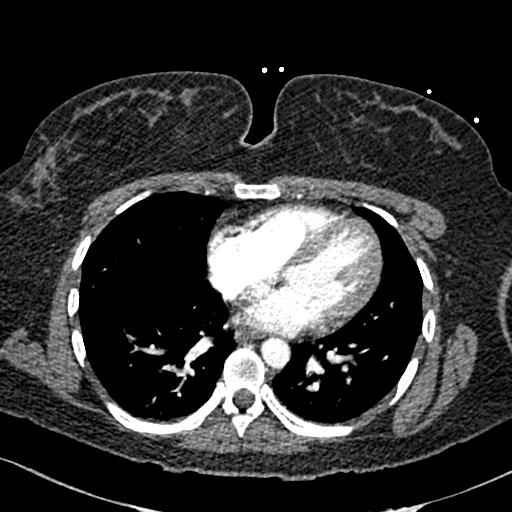
[im 108/248  lung]
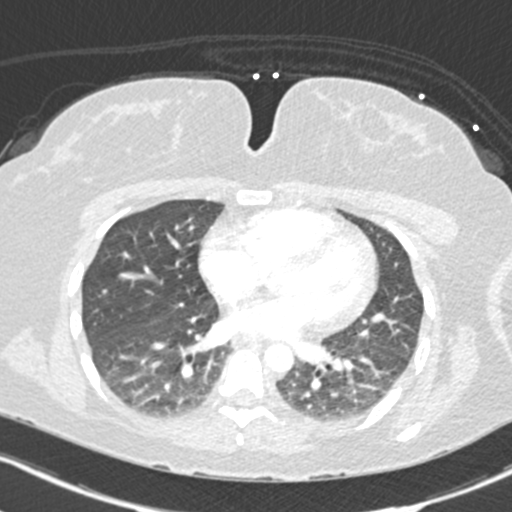
[im 129/248  soft-tissue]
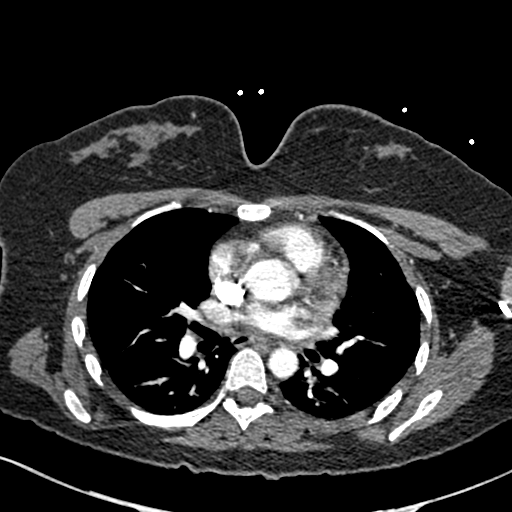
[im 140/248  lung]
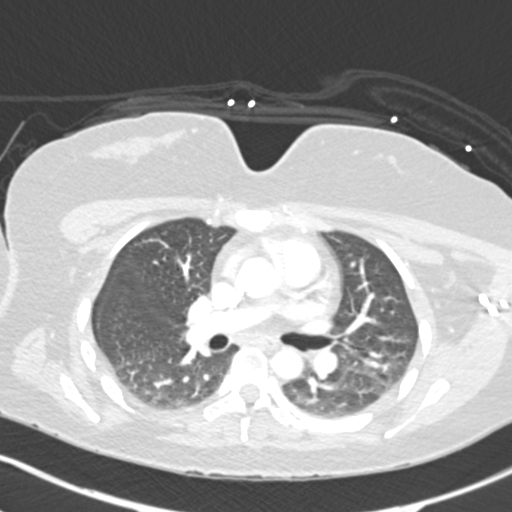
[im 151/248  soft-tissue]
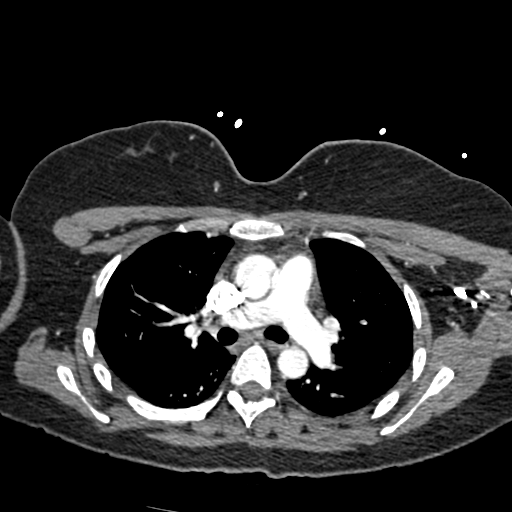
[im 172/248  lung]
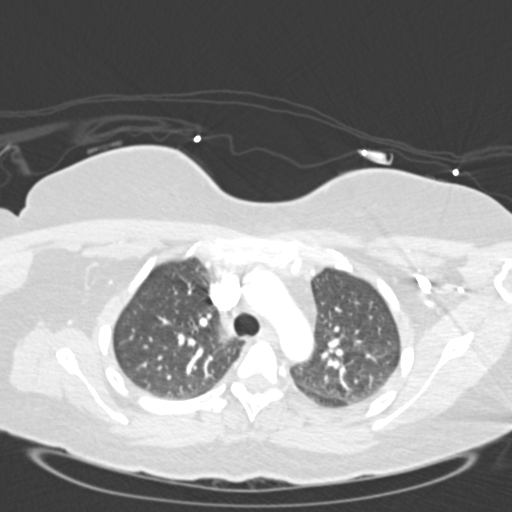
[im 183/248  soft-tissue]
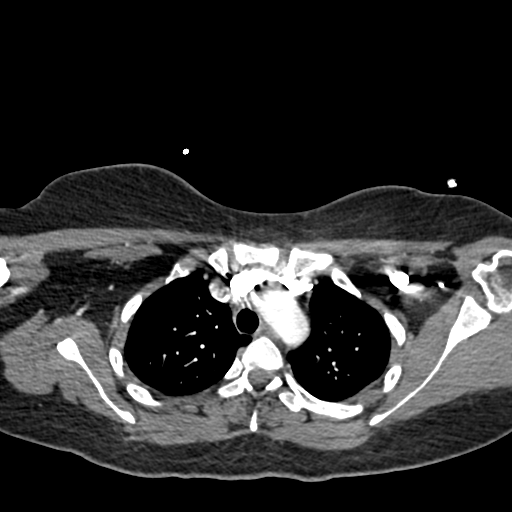
[im 205/248  lung]
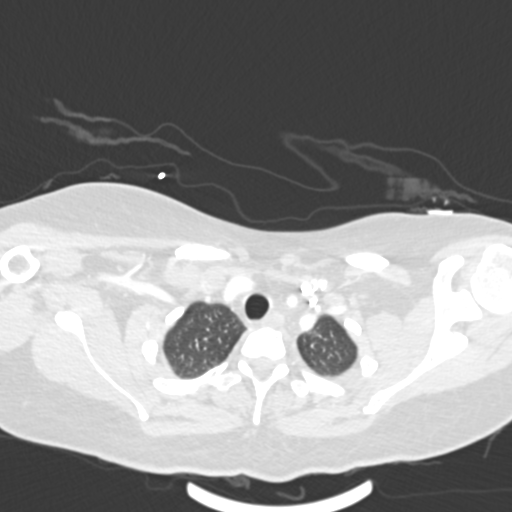
[im 215/248  soft-tissue]
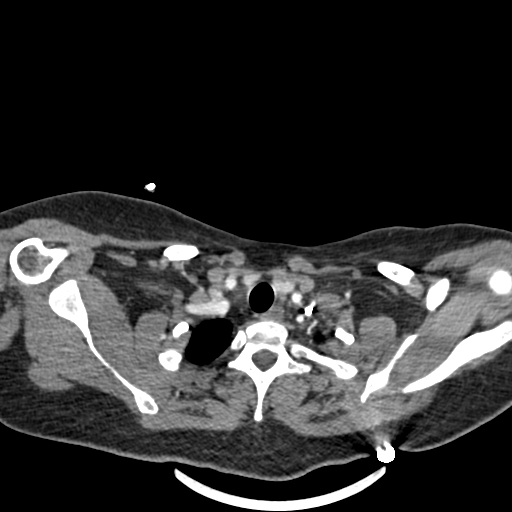
[im 237/248  lung]
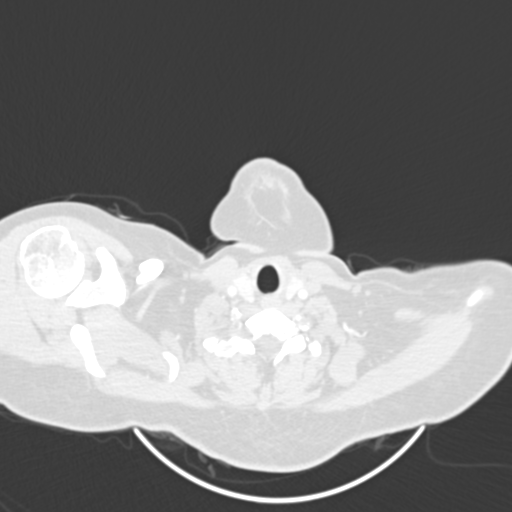

[Series 7: coronal mpr · coronal · 0.59mm/px · 3 of 151 slices shown]
[im 38/151  soft-tissue]
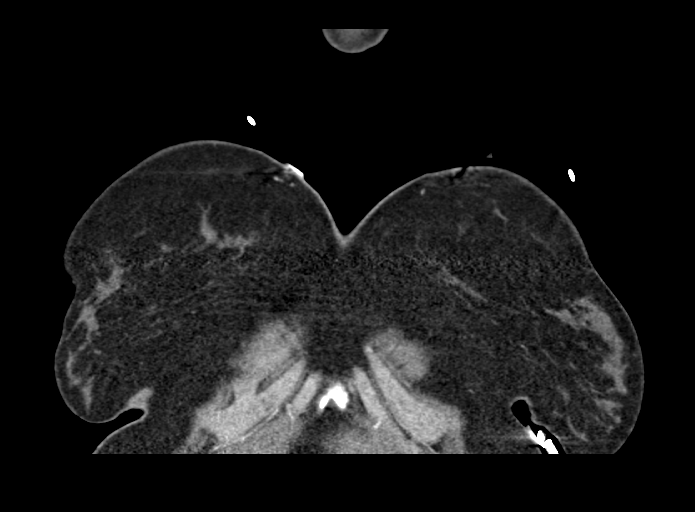
[im 76/151  soft-tissue]
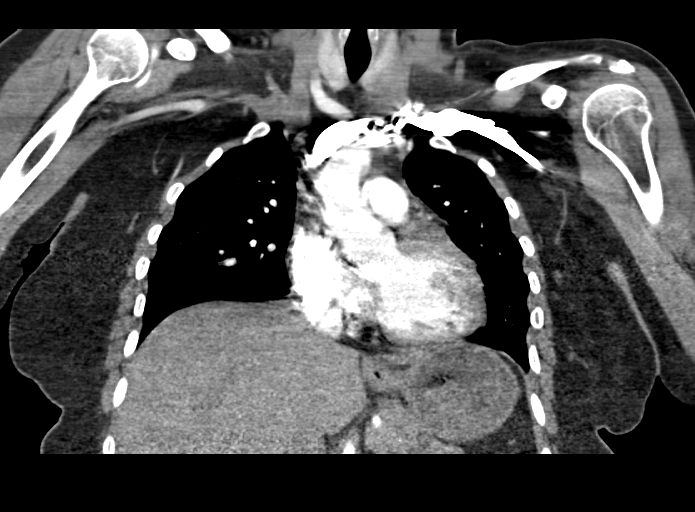
[im 113/151  soft-tissue]
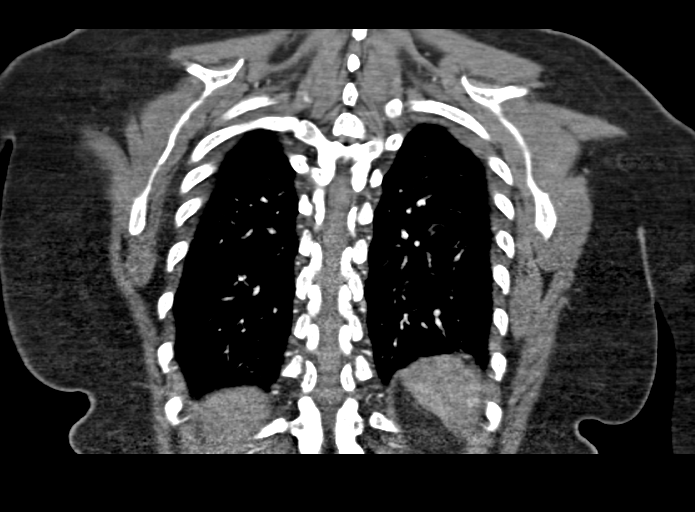

[18 of 46 positions shown; findings below may reference images not displayed]

FINDINGS: Cardiovascular: Adequate contrast bolus timing in the pulmonary
arterial tree. Minor respiratory motion.

No focal filling defect identified in the pulmonary arteries to
suggest acute pulmonary embolism.

No calcified coronary artery atherosclerosis is evident. Negative
visible aorta and proximal great vessels.

Cardiac size within normal limits. No pericardial effusion.

Mediastinum/Nodes: Negative, no lymphadenopathy. Negative visible
thoracic inlet.

Lungs/Pleura: Major airways are patent. Minor dependent atelectasis.
No pleural effusion or other abnormal pulmonary opacity.

Upper Abdomen: Negative visible liver, spleen, pancreas, adrenal
glands, kidneys, and stomach.

Musculoskeletal: Negative.

Review of the MIP images confirms the above findings.
IMPRESSION: Negative chest CTA.  No evidence of acute pulmonary embolus.

## 2020-04-20 ENCOUNTER — Other Ambulatory Visit: Payer: PRIVATE HEALTH INSURANCE

## 2020-05-21 ENCOUNTER — Telehealth (INDEPENDENT_AMBULATORY_CARE_PROVIDER_SITE_OTHER): Payer: No Payment, Other | Admitting: Psychiatry

## 2020-05-21 ENCOUNTER — Other Ambulatory Visit: Payer: Self-pay

## 2020-05-21 ENCOUNTER — Encounter (HOSPITAL_COMMUNITY): Payer: Self-pay

## 2020-05-21 DIAGNOSIS — F411 Generalized anxiety disorder: Secondary | ICD-10-CM | POA: Diagnosis not present

## 2020-05-21 MED ORDER — BUSPIRONE HCL 10 MG PO TABS: 10.0000 mg | ORAL_TABLET | Freq: Two times a day (BID) | ORAL | 3 refills | Status: DC

## 2020-05-21 MED ORDER — LORAZEPAM 1 MG PO TABS: 1.0000 mg | ORAL_TABLET | Freq: Two times a day (BID) | ORAL | 0 refills | Status: DC
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
## 2020-07-09 ENCOUNTER — Ambulatory Visit (INDEPENDENT_AMBULATORY_CARE_PROVIDER_SITE_OTHER): Payer: No Payment, Other | Admitting: Clinical

## 2020-07-09 ENCOUNTER — Other Ambulatory Visit: Payer: Self-pay

## 2020-07-09 DIAGNOSIS — F411 Generalized anxiety disorder: Secondary | ICD-10-CM
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
## 2020-07-16 ENCOUNTER — Other Ambulatory Visit: Payer: Self-pay

## 2020-07-16 ENCOUNTER — Encounter (HOSPITAL_COMMUNITY): Payer: Self-pay

## 2020-07-16 ENCOUNTER — Telehealth (INDEPENDENT_AMBULATORY_CARE_PROVIDER_SITE_OTHER): Payer: No Payment, Other | Admitting: Psychiatry

## 2020-07-16 DIAGNOSIS — F411 Generalized anxiety disorder: Secondary | ICD-10-CM

## 2020-07-16 MED ORDER — ZOLPIDEM TARTRATE 10 MG PO TABS: 10.0000 mg | ORAL_TABLET | Freq: Every evening | ORAL | 0 refills | Status: DC | PRN

## 2020-07-16 MED ORDER — BUSPIRONE HCL 30 MG PO TABS: 15.0000 mg | ORAL_TABLET | Freq: Two times a day (BID) | ORAL | 2 refills | Status: AC

## 2020-07-16 MED ORDER — LORAZEPAM 1 MG PO TABS: 1.0000 mg | ORAL_TABLET | Freq: Two times a day (BID) | ORAL | 0 refills | Status: DC
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
## 2020-09-04 ENCOUNTER — Ambulatory Visit (INDEPENDENT_AMBULATORY_CARE_PROVIDER_SITE_OTHER): Payer: No Payment, Other | Admitting: Clinical

## 2020-09-04 ENCOUNTER — Other Ambulatory Visit: Payer: Self-pay

## 2020-09-04 ENCOUNTER — Telehealth (HOSPITAL_COMMUNITY): Payer: Self-pay | Admitting: Clinical

## 2020-09-04 ENCOUNTER — Telehealth (HOSPITAL_COMMUNITY): Payer: Self-pay | Admitting: *Deleted

## 2020-09-04 DIAGNOSIS — F411 Generalized anxiety disorder: Secondary | ICD-10-CM

## 2020-09-04 NOTE — Telephone Encounter (Signed)
Notified by patients therapist she had concerns about her meds. She states she is doing very well with the Ambien, finds getting quality sleep is a Corporate treasurer" and when she gets quality sleep she finds she doesn't need to take the Ativan. She has never taken more than one Ativan a day and has a supply. She said she would be very happy going forward to never take Ativan, and to continue with the Buspar 15 mg and Ambien and in the future to talk with provider about another medicine for sleep other than a controlled drug. For now however she is out of Ambien and calling for a new rx. Will notify Nanine Means NP for a rx and patient reminded of her follow up appt on 8/15.
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
## 2020-09-10 ENCOUNTER — Other Ambulatory Visit (HOSPITAL_COMMUNITY): Payer: Self-pay | Admitting: Psychiatry

## 2020-09-14 ENCOUNTER — Other Ambulatory Visit (HOSPITAL_COMMUNITY): Payer: Self-pay | Admitting: Psychiatry

## 2020-09-15 NOTE — Telephone Encounter (Signed)
Needs to taper off of controlled substances

## 2020-09-17 ENCOUNTER — Telehealth (HOSPITAL_COMMUNITY): Payer: Self-pay | Admitting: Psychiatry

## 2020-09-17 ENCOUNTER — Other Ambulatory Visit (HOSPITAL_COMMUNITY): Payer: Self-pay | Admitting: Psychiatry

## 2020-09-17 NOTE — Telephone Encounter (Signed)
Provider is out of the office & has not been able to refill ambien 10 mg.     Pt uses [facility]. Pt ph [phone]

## 2020-09-18 ENCOUNTER — Other Ambulatory Visit (HOSPITAL_COMMUNITY): Payer: Self-pay | Admitting: Psychiatry

## 2020-09-18 ENCOUNTER — Telehealth (HOSPITAL_COMMUNITY): Payer: Self-pay | Admitting: *Deleted

## 2020-09-18 MED ORDER — ZOLPIDEM TARTRATE 10 MG PO TABS: 10.0000 mg | ORAL_TABLET | Freq: Every evening | ORAL | 0 refills | Status: DC | PRN

## 2020-09-18 NOTE — Telephone Encounter (Signed)
Pt calling checking status of ambien request from July 5th. Provider is on vacation.

## 2020-09-18 NOTE — Telephone Encounter (Signed)
Medication refilled and sent to preferred pharmacy

## 2020-09-18 NOTE — Telephone Encounter (Signed)
This patient is a patient of Nanine Means. Please forward this to her.

## 2020-09-18 NOTE — Telephone Encounter (Signed)
I had a contact with patient and put a note to her provider on the 15 th but provider did not respond to it. I texted provider today re a different patient and was told she is on vacation and does not have internet access. I will ask if Stephanie Cookey NP will provide her with a rx for her Ambien.

## 2020-09-27 ENCOUNTER — Ambulatory Visit (HOSPITAL_COMMUNITY): Payer: Self-pay | Admitting: Clinical

## 2020-09-28 ENCOUNTER — Ambulatory Visit (INDEPENDENT_AMBULATORY_CARE_PROVIDER_SITE_OTHER): Payer: No Payment, Other | Admitting: Clinical

## 2020-09-28 DIAGNOSIS — F411 Generalized anxiety disorder: Secondary | ICD-10-CM | POA: Diagnosis not present

## 2020-09-28 NOTE — Progress Notes (Signed)
THERAPIST PROGRESS NOTE Virtual Visit via Video Note  I connected with Stephanie Patrick on 09/28/20 at 11:00 AM EDT by a video enabled telemedicine application and verified that I am speaking with the correct person using two identifiers.  Location: Patient: home Provider: office   I discussed the limitations of evaluation and management by telemedicine and the availability of in person appointments. The patient expressed understanding and agreed to proceed.  Follow Up Instructions: I discussed the assessment and treatment plan with the patient. The patient was provided an opportunity to ask questions and all were answered. The patient agreed with the plan and demonstrated an understanding of the instructions.   The patient was advised to call back or seek an in-person evaluation if the symptoms worsen or if the condition fails to improve as anticipated.   Session Time: 53 minutes  Participation Level: Active  Behavioral Response: CasualAlertEuthymic  Type of Therapy: Individual Therapy  Treatment Goals addressed: Coping  Interventions: CBT and Supportive  Summary:  Stephanie Patrick is a 35 y.o. female who presents for the scheduled session oriented x5, appropriately dressed, and friendly.  Client denied hallucinations and delusions. Client reported she is doing well.  Client reported since last session she has recovered from COVID.  Client reported otherwise she is continue to work on her happiness.  Client reported she has been able to create happiness for herself and not being so strict and learning to let go things are out of her control. 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  Client reported now she is going to enforce boundaries for herself with family and friends to help her feel more in control of her life.  Client reported self-care is taken on remaining for her.   Client reported she has noticed that self-care this comes with discipline.  Client reported she started to believe he have to have a lot of money to be able to do self-care but that is not the case because essentially just use what you have.  Client reported she grew up in a family appointment to depended on meant to provide for everything and her grandmother does not understand her independent lifestyle that she is living.  Client reported she is interviewing for a job and is hopeful that she will get it.  Client reported she is enjoying spending time with herself and her dog and is engaging in physical activity daily for at least 1 hour.  Client reported her medication is working well.  Client reported she has a good sleep regimen but struggles with overwhelming thoughts that produce anxiety mainly at night before she goes to sleep.  Client reported she reflects over her whole day which makes her feel overwhelmed.  Client reported otherwise no other complaints.     Suicidal/Homicidal: Nowithout intent/plan  Therapist Response:  Therapist began the session asking the client how she has been doing since last seen. Therapist used eye contact and positive emotional support towards the client's thoughts and feelings. Therapist used CBT to engage with the client and ask her to identify how she is changed people places and things that are more productive towards her alleviation of anxiety. Therapist used CBT to discuss the clients change in perspective compared to her emotions and physical behavior. Therapist assigned the client homework to continue practicing self-care. Client was scheduled for next appointment     Plan: Return  again in 5 weeks.  Diagnosis: Generalized anxiety disorder   Neena Rhymes Mylin Gignac, LCSW 09/28/2020

## 2020-10-15 ENCOUNTER — Encounter (HOSPITAL_COMMUNITY): Payer: Self-pay

## 2020-10-15 ENCOUNTER — Telehealth (INDEPENDENT_AMBULATORY_CARE_PROVIDER_SITE_OTHER): Payer: No Payment, Other | Admitting: Psychiatry

## 2020-10-15 ENCOUNTER — Other Ambulatory Visit: Payer: Self-pay

## 2020-10-15 DIAGNOSIS — F411 Generalized anxiety disorder: Secondary | ICD-10-CM

## 2020-10-15 MED ORDER — BUSPIRONE HCL 15 MG PO TABS: 15.0000 mg | ORAL_TABLET | Freq: Two times a day (BID) | ORAL | 2 refills | Status: DC

## 2020-10-15 MED ORDER — ZOLPIDEM TARTRATE 10 MG PO TABS: 10.0000 mg | ORAL_TABLET | Freq: Every evening | ORAL | 2 refills | Status: DC | PRN

## 2020-10-15 MED ORDER — LORAZEPAM 1 MG PO TABS: 1.0000 mg | ORAL_TABLET | Freq: Every day | ORAL | 2 refills | Status: AC
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
## 2020-10-31 ENCOUNTER — Other Ambulatory Visit: Payer: Self-pay

## 2020-10-31 ENCOUNTER — Other Ambulatory Visit: Payer: Self-pay | Admitting: *Deleted

## 2020-10-31 DIAGNOSIS — Z124 Encounter for screening for malignant neoplasm of cervix: Secondary | ICD-10-CM

## 2020-10-31 NOTE — Progress Notes (Signed)
Patient: Stephanie Patrick           Date of Birth: 1985/11/30           MRN: [id number] Visit Date: 10/31/2020 PCP: Sandford Craze, NP  Cervical Cancer Screening Do you smoke?: No Have you ever had or been told you have an allergy to latex products?: No Marital status: Single Date of last pap smear: 2-5 yrs ago (04/06/2017 LSIL/HPV+ [facility])) Date of last menstrual period: 10/15/20 Number of pregnancies: 1 Number of births: 0 Have you ever had any of the following? Hysterectomy: No Tubal ligation (tubes tied): No Abnormal bleeding: No Abnormal pap smear: Yes (06/12/2017 Colpo-benign [facility])) Venereal warts: No A sex partner with venereal warts: No A high risk* sex partner: No  Cervical Exam  Abnormal Observations: Redness observed on external genitalia. Thick yellowish colored discharge observed in vagina. Patient stated she is currently using OTC treatment for a yeast infection that she has noticed any improvement. Advised patient to go to the [facility] for a wet prep and possible STD Screening. Recommendations: Last Pap smear was 04/06/2017 at [facility] and LSIL with positive HPV. Patient had a colposcopy completed 06/12/2017 that was benign. Per patient her last Pap smear is the only Pap smear she has had. Last Pap smear is available in Epic. Let patient know that if today's Pap smear is normal and HPV negative that her next Pap smear is due in one year due to her last Pap smear was abnormal. Informed patient that we will follow-up with her within the next couple of weeks with results of her Pap smear by phone. Patient verbalized understanding.     Patient's History Patient Active Problem List   Diagnosis Date Noted   Generalized anxiety disorder 05/21/2020   Acute systolic CHF (congestive heart failure) (HCC)    Acute respiratory failure with hypoxia and hypercapnia (HCC) 03/19/2018   Sepsis, Gram negative (HCC) 03/18/2018    Nephrolithiasis 03/18/2018   Urinary retention 03/18/2018   Migraines 02/16/2017   Vitamin D deficiency 02/16/2017   Preventative health care 02/16/2017   Insomnia 02/16/2017   Recurrent UTI 02/13/2017   ADHD 02/13/2017   Past Medical History:  Diagnosis Date   Acute pyelonephritis 03/2018   Acute respiratory failure (HCC) 03/25/2018   Ventilatory   ADHD 02/13/2017   Anxiety    Cardiomyopathy (HCC) 03/2018   03/18/2018 EF 30-35% improved to 50-55% on 03/25/2018   Chicken pox    Complication of anesthesia    Woke up after surgery, felt paralyzed but unable to speak, made very scared   Depression    E. coli sepsis (HCC) 03/2018   Grade II diastolic dysfunction    noted on ECHO 03/19/2018 had improved on ECHO 03/25/2018   Gram negative septic shock (HCC) 03/2018   History of kidney stones 03/18/2018   4 mm stone within the distal left ureter   Hydronephrosis 03/19/2018   Left hydronephrosis mild to moderate left hydroureteronephrosis   Migraines    Pneumonia    PONV (postoperative nausea and vomiting)    Pulmonary edema 03/20/2018   Noted on CXR   Urinary tract infection     Family History  Problem Relation Age of Onset   Healthy Mother    Diabetes Maternal Grandmother    Hypertension Maternal Grandmother    Breast cancer Maternal Grandmother     Social History   Occupational History   Not on file  Tobacco Use   Smoking  status: Never   Smokeless tobacco: Never  Vaping Use   Vaping Use: Never used  Substance and Sexual Activity   Alcohol use: Yes   Drug use: No   Sexual activity: Yes    Partners: Male    Birth control/protection: None

## 2020-10-31 NOTE — Addendum Note (Signed)
Addended by: Narda Rutherford on: 10/31/2020 03:06 PM   Modules accepted: Orders

## 2020-11-02 ENCOUNTER — Telehealth: Payer: Self-pay

## 2020-11-02 LAB — CYTOLOGY - PAP
Adequacy: ABSENT
Comment: NEGATIVE
Diagnosis: UNDETERMINED — AB
High risk HPV: NEGATIVE

## 2020-11-02 NOTE — Telephone Encounter (Addendum)
Patient returned call, was informed Pap results, ASCUS, HPV-negative, due for pap in 1 year. Discussed with patient, Patient verbalized understanding.  Attempted to contact patient regarding lab (pap) results. Left message on voicemail requesting a return call.

## 2020-11-07 NOTE — Progress Notes (Signed)
Repeat pap smear in 1 year

## 2020-11-22 ENCOUNTER — Ambulatory Visit (HOSPITAL_COMMUNITY): Payer: No Payment, Other | Admitting: Clinical

## 2021-01-03 ENCOUNTER — Ambulatory Visit (INDEPENDENT_AMBULATORY_CARE_PROVIDER_SITE_OTHER): Payer: No Payment, Other | Admitting: Clinical

## 2021-01-03 DIAGNOSIS — F411 Generalized anxiety disorder: Secondary | ICD-10-CM

## 2021-01-04 NOTE — Progress Notes (Signed)
   THERAPIST PROGRESS NOTE Virtual Visit via Video Note  I connected with Stephanie Patrick on 01/03/2021 at  4:00 PM EDT by a video enabled telemedicine application and verified that I am speaking with the correct person using two identifiers.  Location: Patient: home Provider: office   I discussed the limitations of evaluation and management by telemedicine and the availability of in person appointments. The patient expressed understanding and agreed to proceed.   Follow Up Instructions: I discussed the assessment and treatment plan with the patient. The patient was provided an opportunity to ask questions and all were answered. The patient agreed with the plan and demonstrated an understanding of the instructions.   The patient was advised to call back or seek an in-person evaluation if the symptoms worsen or if the condition fails to improve as anticipated.   Session Time: 45 minutes  Participation Level: Active  Behavioral Response: CasualAlertIrritable  Type of Therapy: Individual Therapy  Treatment Goals addressed: Coping  Interventions: CBT and Supportive  Summary:  Stephanie Patrick is a 35 y.o. female who presents for the scheduled session oriented x5, appropriately dressed, and friendly.  Client denied hallucinations and delusions. Client reported on today that she is feeling exhausted and irritated.  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  Client reported that Jonny Ruiz has presented her with stressors such as working in the time zone and her boss does not have ethical ways of operating.  Client reported her boss comes office having an ego and violates boundaries of constantly messaging her at inappropriate times, creating unrealistic deadlines to get jobs done.  Client reported even today session she let them know ahead of time about the appointment but he continues to message her.   Client reported she has not had time to properly take care of herself and is not getting good rest at night.  Client reported she works 12-hour days or more depending on what needs to get done.  Client reported she is putting in her applications.  Client reported this week coming up she has to fly to [facility] to meet with her company and other coworkers.  Client reported she does not want to speak with her psychiatrist about adjusting medication to help with her nighttime dosing.   Suicidal/Homicidal: Nowithout intent/plan  Therapist Response:  Therapist began the appointment asking the client how she has been doing since last seen. Therapist used CBT to utilize active listening and positive emotional support towards her thoughts and feelings. Therapist used CBT to engage the client to ask her to identify particular stresses about her job better affecting her anxiety and sleeping patterns. Therapist used CBT to normalize the clients thoughts and feelings towards her worklife balance. Therapist assigned client homework to have the client challenge negative thinking to find something positive about her upcoming trip and how she reordered her work schedule so she can have time to do self-care activities. Client was scheduled for next appointment.     Plan: Return again in 4 weeks.  Diagnosis: Generalized anxiety disorder   Neena Rhymes [place], LCSW 01/03/2021

## 2021-01-14 ENCOUNTER — Telehealth (INDEPENDENT_AMBULATORY_CARE_PROVIDER_SITE_OTHER): Payer: No Payment, Other | Admitting: Psychiatry

## 2021-01-14 ENCOUNTER — Encounter (HOSPITAL_COMMUNITY): Payer: Self-pay

## 2021-01-14 DIAGNOSIS — F331 Major depressive disorder, recurrent, moderate: Secondary | ICD-10-CM

## 2021-01-14 DIAGNOSIS — F411 Generalized anxiety disorder: Secondary | ICD-10-CM

## 2021-01-14 DIAGNOSIS — G4700 Insomnia, unspecified: Secondary | ICD-10-CM | POA: Diagnosis not present

## 2021-01-14 MED ORDER — FLUOXETINE HCL 10 MG PO CAPS
10.0000 mg | ORAL_CAPSULE | Freq: Every day | ORAL | 1 refills | Status: DC
Start: 2021-01-14 — End: 2021-05-21

## 2021-01-14 MED ORDER — AMPHETAMINE-DEXTROAMPHETAMINE 10 MG PO TABS: 10.0000 mg | ORAL_TABLET | Freq: Two times a day (BID) | ORAL | 0 refills | Status: DC

## 2021-01-14 MED ORDER — ZOLPIDEM TARTRATE 10 MG PO TABS: 10.0000 mg | ORAL_TABLET | Freq: Every evening | ORAL | 2 refills | Status: DC | PRN

## 2021-01-14 NOTE — Progress Notes (Signed)
[facility] NP OP Progress Notes  Patient Identification: Stephanie Patrick MRN:  [id number] Date of Evaluation:  01/14/2021 Referral Source: self Chief Complaint:  Anxiety Chief Complaint   Anxiety; Follow-up; Depression    Visit Diagnosis:  General anxiety disorder  History of Present Illness:  35 year old female with a history of anxiety, ADHD, and depression returns for a follow-up visit to reassess anxiety and depression. The patient states that since returning to work, she has found that she has not been sleeping well due to anxiety, getting only four hours a night and being exhausted. The patient denies feeling depressed, but is teary-eyed when speaking about her job, and states that it is very anxiety producing. The patient denies any suicidal ideation, homicidal ideation, delusions, or hallucinations; she endorses having a positive support system, and that her friends are taking her on a vacation around Thanksgiving. She states that this is a contracted job and will be over at the end of the year, confessing "I am less stressed about not having a job than having to keep working these long hours." Prior to the job the patient was doing well, and attributes her recent sleep disturbance to work-related burnout. The patient also is seeing a therapist outpatient and is working on sleep techniques; she states feeling comfortable reaching out if she feels worse and is attempting to switch contracts at the start of the year for a better work-life balance.  Her appetite is decreased from the stress at work with a 27 pound weight loss since August. Discussed mindfulness activities to assist her ability to relax and medications to assist, see plan below, follow up in one month.  Past Psychiatric History: depression, anxiety  Past Medical History:  Past Medical History:  Diagnosis Date   Acute pyelonephritis 03/2018   Acute respiratory failure ([place]) 03/25/2018   Ventilatory   ADHD 02/13/2017   Anxiety     Cardiomyopathy ([place]) 03/2018   03/18/2018 EF 30-35% improved to 50-55% on 03/25/2018   Chicken pox    Complication of anesthesia    Woke up after surgery, felt paralyzed but unable to speak, made very scared   Depression    E. coli sepsis ([place]) 03/2018   Grade II diastolic dysfunction    noted on ECHO 03/19/2018 had improved on ECHO 03/25/2018   Gram negative septic shock ([place]) 03/2018   History of kidney stones 03/18/2018   4 mm stone within the distal left ureter   Hydronephrosis 03/19/2018   Left hydronephrosis mild to moderate left hydroureteronephrosis   Migraines    Pneumonia    PONV (postoperative nausea and vomiting)    Pulmonary edema 03/20/2018   Noted on CXR   Urinary tract infection     Past Surgical History:  Procedure Laterality Date   CYSTOSCOPY W/ URETERAL STENT PLACEMENT Left 03/18/2018   Procedure: CYSTOSCOPY WITH RETROGRADE PYELOGRAM/URETERAL STENT PLACEMENT;  Surgeon: Festus Aloe, MD;  Location: [facility];  Service: Urology;  Laterality: Left;   CYSTOSCOPY/URETEROSCOPY/HOLMIUM LASER/STENT PLACEMENT Left 04/15/2018   Procedure: CYSTOSCOPY/URETEROSCOPY stent removal and stone extraction;  Surgeon: Festus Aloe, MD;  Location: [facility];  Service: Urology;  Laterality: Left;  ONLY NEEDS 60 MIN   WISDOM TOOTH EXTRACTION      Family Psychiatric History: mother with depression and anxiety along with all the women in my family--won't do anything about it.  Paternal side of addiction issues.  Father committed suicide when she was ten.  Family History:  Family History  Problem Relation Age of Onset  Healthy Mother    Diabetes Maternal Grandmother    Hypertension Maternal Grandmother    Breast cancer Maternal Grandmother     Social History:   Social History   Socioeconomic History   Marital status: Single    Spouse name: Not on file   Number of children: Not on file   Years of education: Not on file   Highest education level: Not on  file  Occupational History   Not on file  Tobacco Use   Smoking status: Never   Smokeless tobacco: Never  Vaping Use   Vaping Use: Never used  Substance and Sexual Activity   Alcohol use: Yes   Drug use: No   Sexual activity: Yes    Partners: Male    Birth control/protection: None  Other Topics Concern   Not on file  Social History Narrative   Dad committed suicide   Mom in [place]   Enjoys reading.    Best friend lives here   Social Determinants of Corporate investment banker Strain: Not on file  Food Insecurity: Not on file  Transportation Needs: Not on file  Physical Activity: Not on file  Stress: Not on file  Social Connections: Not on file    Additional Social History: Lives alone, moved here in January, back from [place]  Allergies:   Allergies  Allergen Reactions   Vicodin [Hydrocodone-Acetaminophen] Nausea And Vomiting    Metabolic Disorder Labs: Lab Results  Component Value Date   HGBA1C 5.4 02/11/2018   No results found for: PROLACTIN Lab Results  Component Value Date   CHOL 272 (H) 04/01/2018   TRIG 193 (H) 04/01/2018   HDL 30 (L) 04/01/2018   CHOLHDL 9.1 (H) 04/01/2018   LDLCALC 203 (H) 04/01/2018   LDLCALC 121 (H) 02/11/2018   Lab Results  Component Value Date   TSH 1.810 04/01/2018    Therapeutic Level Labs: No results found for: LITHIUM No results found for: CBMZ No results found for: VALPROATE  Current Medications: Current Outpatient Medications  Medication Sig Dispense Refill   busPIRone (BUSPAR) 15 MG tablet Take 1 tablet (15 mg total) by mouth 2 (two) times daily. 60 tablet 2   zolpidem (AMBIEN) 10 MG tablet Take 1 tablet (10 mg total) by mouth at bedtime as needed for sleep. 30 tablet 2   No current facility-administered medications for this visit.    Musculoskeletal: Strength & Muscle Tone: WDL Gait & Station: WDL Patient leans: WDL  Psychiatric Specialty Exam: Review of Systems  Psychiatric/Behavioral:   Positive for dysphoric mood and sleep disturbance. The patient is nervous/anxious.   All other systems reviewed and are negative.  There were no vitals taken for this visit.There is no height or weight on file to calculate BMI.  General Appearance: WDL  Eye Contact:  Good  Speech:  Normal Rate  Volume:  Normal  Mood:  anxiety, depression  Affect:  congruent  Thought Process:  Coherent and Descriptions of Associations: Intact  Orientation:  Full (Time, Place, and Person)  Thought Content:  WDL and Logical  Suicidal Thoughts:  No  Homicidal Thoughts:  No  Memory:  Immediate;   Good Recent;   Good Remote;   Good  Judgement:  Good  Insight:  Good  Psychomotor Activity:  Normal  Concentration:  Concentration: Good and Attention Span: Good  Recall:  Good  Fund of Knowledge:Good  Language: Good  Akathisia:  No  Handed:  Right  AIMS (if indicated):  not done  Assets:  Housing Leisure Time Physical Health Resilience Social Support Vocational/Educational  ADL's:  Intact  Cognition: WNL  Sleep:  poor   Screenings: GAD-7    Advertising copywriter from 07/09/2020 in [facility]  Total GAD-7 Score 13      PHQ2-9    Flowsheet Row Counselor from 07/09/2020 in [facility] Video Visit from 05/21/2020 in [facility] Office Visit from 02/13/2017 in [facility]  PHQ-2 Total Score 0 1 1  PHQ-9 Total Score -- -- 6      Flowsheet Row Counselor from 07/09/2020 in [facility] Video Visit from 05/21/2020 in [facility]  C-SSRS RISK CATEGORY No Risk No Risk       Assessment and Plan:  General anxiety disorder: -Client discontinued Buspar 15 mg BID, not effective -Started Prozac 10 mg daily  ADHD: Testing in high school and college -Started Adderall 10 mg BID PRN   Insomnia: -Continue Ambien 10 mg at  bedtime PRN  Virtual Visit via Telephone Note  I connected with [name] on 01/14/21 at 10:30 AM EST by telephone and verified that I am speaking with the correct person using two identifiers.  Location: Patient: home  Provider: home office   I discussed the limitations, risks, security and privacy concerns of performing an evaluation and management service by telephone and the availability of in person appointments. I also discussed with the patient that there may be a patient responsible charge related to this service. The patient expressed understanding and agreed to proceed.  Follow Up Instructions: Follow up in 1 month   I discussed the assessment and treatment plan with the patient. The patient was provided an opportunity to ask questions and all were answered. The patient agreed with the plan and demonstrated an understanding of the instructions.   The patient was advised to call back or seek an in-person evaluation if the symptoms worsen or if the condition fails to improve as anticipated.  I provided 25 minutes of non-face-to-face time during this encounter.   Nanine Means, NP   Nanine Means, NP 11/14/202210:37 AM

## 2021-01-23 ENCOUNTER — Ambulatory Visit (INDEPENDENT_AMBULATORY_CARE_PROVIDER_SITE_OTHER): Payer: No Payment, Other | Admitting: Clinical

## 2021-01-23 DIAGNOSIS — F411 Generalized anxiety disorder: Secondary | ICD-10-CM

## 2021-01-23 NOTE — Progress Notes (Signed)
   THERAPIST PROGRESS NOTE Virtual Visit via Video Note  I connected with Stephanie Patrick on 51/88/41 at  8:00 AM EST by a video enabled telemedicine application and verified that I am speaking with the correct person using two identifiers.  Location: Patient: home Provider: office   I discussed the limitations of evaluation and management by telemedicine and the availability of in person appointments. The patient expressed understanding and agreed to proceed.  Follow Up Instructions: I discussed the assessment and treatment plan with the patient. The patient was provided an opportunity to ask questions and all were answered. The patient agreed with the plan and demonstrated an understanding of the instructions.   The patient was advised to call back or seek an in-person evaluation if the symptoms worsen or if the condition fails to improve as anticipated.   Session Time: 50 minutes  Participation Level: Active  Behavioral Response: CasualAlertEuthymic  Type of Therapy: Individual Therapy  Treatment Goals addressed: Coping  Interventions: CBT and Supportive  Summary:  Stephanie Patrick is a 35 y.o. female who presents for the scheduled session oriented times five, appropriately dressed, and friendly.  Client denied hallucinations and delusions. Client reported on today that she has been doing fairly well but still unable to sleep. Client reported since she was last seen she would not however trip to [place]. Client reported she had a overall good experience with many coworkers and getting work done. Client reported her work schedule is still chaotic and is looking for other opportunities for employment. Client engaged with the therapist to discuss her goals for next year regarding her improved mental health, socially, and employment. Client reported she has not given it too much thoughts but knows she would like to have a boyfriend next year. Client reported having feelings that next  year will be boring because things are stable with no exciting changes such as moving or changing jobs as of yet. Client engaged with the therapist on how to incorporate things to look forward to, to add excitement to a mundane routine. Client reported she has met with a psychiatrist and was put on Prozac which has been approximately 10 days. Client reported she has a lack of emotional expression, decreased sleep, and decreased sex drive. Client reported she incorporates other OTC sleep aides such as melatonin to help her sleep but it is not making a difference.    Suicidal/Homicidal: Nowithout intent/plan  Therapist Response:  Therapist began the appointment asking the client how she has been doing since last seen. Therapist used CBT to utilize active listening and positive emotional support towards her thoughts and feelings. Therapist used CBT to ask the client to identify how her behavioral and emotional response has changed compared to new medication regimen. Therapist used CBT to ask the client to identify how she is managing her life stressors and engaged in positive reframing to aid with problem solving challenges. Therapist used CBT to ask client to identify her continued goals for next year. Therapist assigned client homework to practice behavioral activation activities such as engaging in and her usually enjoyed activities to help activate positive emotional responses and behaviors. Client was scheduled for next appointment.   Plan: Return again in 6 weeks.  Diagnosis: Generalized anxiety disorder  Stephanie Jubilee Maciej Schweitzer, LCSW 01/23/2021

## 2021-02-08 ENCOUNTER — Telehealth (HOSPITAL_COMMUNITY): Payer: Self-pay | Admitting: Psychiatry

## 2021-02-08 NOTE — Telephone Encounter (Signed)
Pt called regarding symptoms since beginning new medication PROZAC. Please call pt before refilling. Pt rescheduled appt with Dr. Shaune Pollack 02/11/21 to 03/18/21 due to an dental appt Monday the 12th. Pt ph [phone].

## 2021-02-11 ENCOUNTER — Telehealth (HOSPITAL_COMMUNITY): Payer: No Payment, Other | Admitting: Psychiatry

## 2021-02-15 ENCOUNTER — Encounter (HOSPITAL_COMMUNITY): Payer: Self-pay

## 2021-03-18 ENCOUNTER — Telehealth (HOSPITAL_COMMUNITY): Payer: No Payment, Other | Admitting: Psychiatry

## 2021-03-19 ENCOUNTER — Telehealth (HOSPITAL_COMMUNITY): Payer: No Payment, Other | Admitting: Psychiatry

## 2021-03-28 ENCOUNTER — Ambulatory Visit (INDEPENDENT_AMBULATORY_CARE_PROVIDER_SITE_OTHER): Payer: No Payment, Other | Admitting: Clinical

## 2021-03-28 DIAGNOSIS — F411 Generalized anxiety disorder: Secondary | ICD-10-CM | POA: Diagnosis not present

## 2021-03-28 NOTE — Progress Notes (Signed)
° °  THERAPIST PROGRESS NOTE Virtual Visit via Video Note  I connected with Stephanie Patrick on 03/28/21 at  8:00 AM EST by a video enabled telemedicine application and verified that I am speaking with the correct person using two identifiers.  Location: Patient: home Provider: office   I discussed the limitations of evaluation and management by telemedicine and the availability of in person appointments. The patient expressed understanding and agreed to proceed.   Follow Up Instructions: I discussed the assessment and treatment plan with the patient. The patient was provided an opportunity to ask questions and all were answered. The patient agreed with the plan and demonstrated an understanding of the instructions.   The patient was advised to call back or seek an in-person evaluation if the symptoms worsen or if the condition fails to improve as anticipated.   Session Time: 40 minutes  Participation Level: Active  Behavioral Response: CasualAlertIrritable  Type of Therapy: Individual Therapy  Treatment Goals addressed: Coping  Interventions: CBT and Supportive  Summary:  Stephanie Patrick is a 36 y.o. female who presents for the scheduled session oriented times five, appropriately dressed, and friendly. Client denied hallucinations and delusions. 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 Client reported the job has been extremely stressful and negatively affecting her routine aside from work and her mental health. Client reported her boss has been micormanaging and often keeping way past business time into the late hour of night. Client reported moving forward she looking for work within this time zone and knows what questions to ask to make sure her work will suite her well. Client reported she has been working some leads with her friends about potential job openings. Client reported she is willing to take a ay cut in order  to be in a better work environment. Client discussed she surprised herself with her ability to put up with her negative work environment for so long. Client reported otherwise she is looking forward to getting back on track with taking care of herself and organizing her house back to where it was. Client reported next week she will be going to the beach to watch her friends house and will use it as relaxation time.     Suicidal/Homicidal: Nowithout intent/plan  Therapist Response:  Therapist began the appointment asking the client how she has been doing since last seen. Therapist used CBT to engage using active listening and positive emotional support. Therapist used CBT to have the client describe the severity of her anxiety symptoms and how her daily functioning has changed. Therapist used CBT to engage and have the client describe how she can use her experience to improve moving forward. Therapist used CBT to discuss identifying her preexisting self care tactics and how to begin incorporating them again. Therapist assigned the client homework to try the self care tips suggested and slowly get back into her usual routine.  Client was scheduled for next appointment.     Plan: Return again in 5 weeks.  Diagnosis: Generalized anxiety disorder    Stephanie Rhymes Gerod Caligiuri, LCSW 03/28/2021

## 2021-04-11 ENCOUNTER — Encounter (HOSPITAL_COMMUNITY): Payer: Self-pay

## 2021-04-11 ENCOUNTER — Ambulatory Visit (HOSPITAL_COMMUNITY): Payer: No Payment, Other | Admitting: Clinical

## 2021-04-11 ENCOUNTER — Telehealth (HOSPITAL_COMMUNITY): Payer: Self-pay | Admitting: *Deleted

## 2021-04-11 ENCOUNTER — Telehealth (HOSPITAL_COMMUNITY): Payer: Self-pay | Admitting: Clinical

## 2021-04-11 NOTE — Telephone Encounter (Signed)
Secure chat from patients therapist to report patient told her the medication wasn't working and she is not sleeping. In Nov she was started by Catha Nottingham NP on Ambien 10 mg. She has a future appt on 05/21/21. Will forward message to provider and see if she can adjust meds via phone or would like to see her sooner.

## 2021-04-11 NOTE — Telephone Encounter (Signed)
Therapist made contact with client at the time of her scheduled appointment.  Client reported she is out of town with family and does not have privacy to complete her therapy appointment.  Client did not report that she would like to speak with the nurse because her medications were not effective.  Client reported she has not been able to sleep.  Therapist informed client a message will be sent to the nursing home psychiatrist to take care of her request.

## 2021-04-15 ENCOUNTER — Telehealth (HOSPITAL_COMMUNITY): Payer: Self-pay | Admitting: Clinical

## 2021-04-15 NOTE — Telephone Encounter (Signed)
Therapist attempted to call the client via telephone.  Therapist attempted to let the client know there was an opening for a therapy appointment.  Client did not answer the phone.  Therapist left the client a voicemail stating she can call the office back.

## 2021-04-17 ENCOUNTER — Telehealth (HOSPITAL_COMMUNITY): Payer: Self-pay | Admitting: Clinical

## 2021-04-17 NOTE — Telephone Encounter (Signed)
Therapist attempted to call the client via telephone to inform her of availability for a therapy appt on today's date. Client did not answer the phone. Therapist left a voicemail for her to call back.

## 2021-04-25 ENCOUNTER — Ambulatory Visit (INDEPENDENT_AMBULATORY_CARE_PROVIDER_SITE_OTHER): Payer: No Payment, Other | Admitting: Clinical

## 2021-04-25 DIAGNOSIS — F411 Generalized anxiety disorder: Secondary | ICD-10-CM | POA: Diagnosis not present

## 2021-04-25 NOTE — Plan of Care (Signed)
Client was in agreement.

## 2021-04-25 NOTE — Progress Notes (Signed)
° °  THERAPIST PROGRESS NOTE Virtual Visit via Video Note  I connected with Stephanie Patrick on 04/25/2021 at  8:00 AM EST by a video enabled telemedicine application and verified that I am speaking with the correct person using two identifiers.  Location: Patient: home Provider: office   I discussed the limitations of evaluation and management by telemedicine and the availability of in person appointments. The patient expressed understanding and agreed to proceed.   Follow Up Instructions: I discussed the assessment and treatment plan with the patient. The patient was provided an opportunity to ask questions and all were answered. The patient agreed with the plan and demonstrated an understanding of the instructions.   The patient was advised to call back or seek an in-person evaluation if the symptoms worsen or if the condition fails to improve as anticipated.   Session Time: 35 minutes  Participation Level: Active  Behavioral Response: CasualAlertEuthymic  Type of Therapy: Individual Therapy  Treatment Goals addressed:   ProgressTowards Goals: Progressing  Interventions: CBT and Supportive  Summary:  Stephanie Patrick is a 36 y.o. female who presents for the scheduled session oriented times five, appropriately dressed, and friendly. Client denied hallucinations and delusions. Client is reporting today that she is doing well.  Client reported since she was last seen she has secured another contract position which she will start March 6. Client reported this position will be work from home and on [place] time. Client reported the position she accepted is a demotion from what she is qualified for but is happy to have work. Client reported she reflecting on her previous position is was "toxic" and she does not want to work in conditions as such moving forward because it negatively impacted her sleep hygiene and self care practices.       Suicidal/Homicidal: Nowithout  intent/plan  Therapist Response:  Therapist began the session asking the client how she has been doing since last seen. Therapist used CBT to use active listening and positive emotional support. Therapist used CBT to engage and ask the client about a update about the severity of her anxiety symptoms. Therapist used CBT to engage and discuss self awareness of the clients response to stressors and how she can ensure better boundaries for self care practices. Client was assigned homework to practiced self care. Client was scheduled for next appointment.      Plan: Return again in 5 weeks.  Diagnosis: generalized anxiety disorder  Collaboration of Care: Patient refused AEB N/A  Patient/Guardian was advised Release of Information must be obtained prior to any record release in order to collaborate their care with an outside provider. Patient/Guardian was advised if they have not already done so to contact the registration department to sign all necessary forms in order for [place] to release information regarding their care.   Consent: Patient/Guardian gives verbal consent for treatment and assignment of benefits for services provided during this visit. Patient/Guardian expressed understanding and agreed to proceed.   Neena Rhymes Adron Geisel, LCSW 04/25/2021

## 2021-05-21 ENCOUNTER — Telehealth (INDEPENDENT_AMBULATORY_CARE_PROVIDER_SITE_OTHER): Payer: No Payment, Other | Admitting: Psychiatry

## 2021-05-21 ENCOUNTER — Encounter (HOSPITAL_COMMUNITY): Payer: Self-pay | Admitting: Psychiatry

## 2021-05-21 DIAGNOSIS — F411 Generalized anxiety disorder: Secondary | ICD-10-CM | POA: Diagnosis not present

## 2021-05-21 DIAGNOSIS — F330 Major depressive disorder, recurrent, mild: Secondary | ICD-10-CM | POA: Diagnosis not present

## 2021-05-21 MED ORDER — AMPHETAMINE-DEXTROAMPHETAMINE 10 MG PO TABS: 10.0000 mg | ORAL_TABLET | Freq: Two times a day (BID) | ORAL | 0 refills | Status: DC

## 2021-05-21 MED ORDER — HYDROXYZINE HCL 10 MG PO TABS: 10.0000 mg | ORAL_TABLET | Freq: Three times a day (TID) | ORAL | 2 refills | Status: AC | PRN

## 2021-05-21 MED ORDER — FLUOXETINE HCL 10 MG PO CAPS: 10.0000 mg | ORAL_CAPSULE | Freq: Every day | ORAL | 2 refills | Status: DC

## 2021-05-21 MED ORDER — ZOLPIDEM TARTRATE 10 MG PO TABS: 10.0000 mg | ORAL_TABLET | Freq: Every evening | ORAL | 2 refills | Status: DC | PRN

## 2021-05-21 NOTE — Progress Notes (Signed)
[facility] NP OP Progress Notes ? ?Patient Identification: Stephanie Patrick ?MRN:  [id number] ?Date of Evaluation:  05/21/2021 ?Referral Source: self ?Chief Complaint:  Anxiety ?Chief Complaint   ?Anxiety; Depression; Follow-up ?  ? ?Visit Diagnosis:  ?General anxiety disorder ? ?History of Present Illness:  36 year old female with a history of anxiety, ADHD, and depression returns for a follow-up visit to reassess anxiety and depression.  Her depression has improved to 3/10 with no suicidal ideations.  Anxiety continues to be an issue with moderate to high levels related to stress, panic attacks at times when she is trying to relax prior to bedtime. Discussed medications and started hydroxyzine 10 mg TID PRN and will not increase her Prozac as the antidepressants affect her sex drives.  She is eating healthier and maintaining her weight loss and working on sleep hygiene.  Sleep continues to be an issue despite sleep hygiene practices, medications, and exercise.  Many positive life changes, encouragement provided. ? ?Past Psychiatric History: depression, anxiety ? ?Past Medical History:  ?Past Medical History:  ?Diagnosis Date  ? Acute pyelonephritis 03/2018  ? Acute respiratory failure ([place]) 03/25/2018  ? Ventilatory  ? ADHD 02/13/2017  ? Anxiety   ? Cardiomyopathy ([place]) 03/2018  ? 03/18/2018 EF 30-35% improved to 50-55% on 03/25/2018  ? Chicken pox   ? Complication of anesthesia   ? Woke up after surgery, felt paralyzed but unable to speak, made very scared  ? Depression   ? E. coli sepsis ([place]) 03/2018  ? Grade II diastolic dysfunction   ? noted on ECHO 03/19/2018 had improved on ECHO 03/25/2018  ? Gram negative septic shock ([place]) 03/2018  ? History of kidney stones 03/18/2018  ? 4 mm stone within the distal left ureter  ? Hydronephrosis 03/19/2018  ? Left hydronephrosis mild to moderate left hydroureteronephrosis  ? Migraines   ? Pneumonia   ? PONV (postoperative nausea and vomiting)   ? Pulmonary edema 03/20/2018  ? Noted  on CXR  ? Urinary tract infection   ?  ?Past Surgical History:  ?Procedure Laterality Date  ? CYSTOSCOPY W/ URETERAL STENT PLACEMENT Left 03/18/2018  ? Procedure: CYSTOSCOPY WITH RETROGRADE PYELOGRAM/URETERAL STENT PLACEMENT;  Surgeon: Festus Aloe, MD;  Location: [facility];  Service: Urology;  Laterality: Left;  ? CYSTOSCOPY/URETEROSCOPY/HOLMIUM LASER/STENT PLACEMENT Left 04/15/2018  ? Procedure: CYSTOSCOPY/URETEROSCOPY stent removal and stone extraction;  Surgeon: Festus Aloe, MD;  Location: [facility];  Service: Urology;  Laterality: Left;  ONLY NEEDS 60 MIN  ? WISDOM TOOTH EXTRACTION    ? ? ?Family Psychiatric History: mother with depression and anxiety along with all the women in my family--won't do anything about it.  Paternal side of addiction issues.  Father committed suicide when she was [age]. ? ?Family History:  ?Family History  ?Problem Relation Age of Onset  ? Healthy Mother   ? Diabetes Maternal Grandmother   ? Hypertension Maternal Grandmother   ? Breast cancer Maternal Grandmother   ? ? ?Social History:   ?Social History  ? ?Socioeconomic History  ? Marital status: Single  ?  Spouse name: Not on file  ? Number of children: Not on file  ? Years of education: Not on file  ? Highest education level: Not on file  ?Occupational History  ? Not on file  ?Tobacco Use  ? Smoking status: Never  ? Smokeless tobacco: Never  ?Vaping Use  ? Vaping Use: Never used  ?Substance and Sexual Activity  ? Alcohol use: Yes  ? Drug use:  No  ? Sexual activity: Yes  ?  Partners: Male  ?  Birth control/protection: None  ?Other Topics Concern  ? Not on file  ?Social History Narrative  ? Dad committed suicide  ? Mom in [place]  ? Enjoys reading.   ? Best friend lives here  ? ?Social Determinants of Health  ? ?Financial Resource Strain: Not on file  ?Food Insecurity: Not on file  ?Transportation Needs: Not on file  ?Physical Activity: Not on file  ?Stress: Not on file  ?Social Connections: Not on file   ? ? ?Additional Social History: Lives alone, moved here in January, back from [place] ? ?Allergies:   ?Allergies  ?Allergen Reactions  ? Vicodin [Hydrocodone-Acetaminophen] Nausea And Vomiting  ? ? ?Metabolic Disorder Labs: ?Lab Results  ?Component Value Date  ? HGBA1C 5.4 02/11/2018  ? ?No results found for: PROLACTIN ?Lab Results  ?Component Value Date  ? CHOL 272 (H) 04/01/2018  ? TRIG 193 (H) 04/01/2018  ? HDL 30 (L) 04/01/2018  ? CHOLHDL 9.1 (H) 04/01/2018  ? [place] 203 (H) 04/01/2018  ? LDLCALC 121 (H) 02/11/2018  ? ?Lab Results  ?Component Value Date  ? TSH 1.810 04/01/2018  ? ? ?Therapeutic Level Labs: ?No results found for: LITHIUM ?No results found for: CBMZ ?No results found for: VALPROATE ? ?Current Medications: ?Current Outpatient Medications  ?Medication Sig Dispense Refill  ? amphetamine-dextroamphetamine (ADDERALL) 10 MG tablet Take 1 tablet (10 mg total) by mouth 2 (two) times daily with a meal. 60 tablet 0  ? FLUoxetine (PROZAC) 10 MG capsule Take 1 capsule (10 mg total) by mouth daily. 30 capsule 1  ? zolpidem (AMBIEN) 10 MG tablet Take 1 tablet (10 mg total) by mouth at bedtime as needed for sleep. 30 tablet 2  ? ?No current facility-administered medications for this visit.  ? ? ?Musculoskeletal: ?Strength & Muscle Tone: WDL ?Gait & Station: WDL ?Patient leans: WDL ? ?Psychiatric Specialty Exam: ?Review of Systems  ?Psychiatric/Behavioral:  Positive for dysphoric mood and sleep disturbance. The patient is nervous/anxious.   ?All other systems reviewed and are negative.  ?There were no vitals taken for this visit.There is no height or weight on file to calculate BMI.  ?General Appearance: WDL  ?Eye Contact:  Good  ?Speech:  Normal Rate  ?Volume:  Normal  ?Mood:  anxiety, depression  ?Affect:  congruent  ?Thought Process:  Coherent and Descriptions of Associations: Intact  ?Orientation:  Full (Time, Place, and Person)  ?Thought Content:  WDL and Logical  ?Suicidal Thoughts:  No  ?Homicidal  Thoughts:  No  ?Memory:  Immediate;   Good ?Recent;   Good ?Remote;   Good  ?Judgement:  Good  ?Insight:  Good  ?Psychomotor Activity:  Normal  ?Concentration:  Concentration: Good and Attention Span: Good  ?Recall:  Good  ?Fund of [place]  ?Language: Good  ?Akathisia:  No  ?Handed:  Right  ?AIMS (if indicated):  not done  ?Assets:  Housing ?Leisure Time ?Physical Health ?Resilience ?Social Support ?Vocational/Educational  ?ADL's:  Intact  ?Cognition: WNL  ?Sleep:  fair to poor  ? ?Screenings: ?GAD-7   ? ?Flowsheet Row Counselor from 07/09/2020 in [facility]  ?Total GAD-7 Score 13  ? ?  ? ?PHQ2-9   ? ?Flowsheet Row Counselor from 07/09/2020 in [facility] Video Visit from 05/21/2020 in [facility] Office Visit from 02/13/2017 in [facility]  ?PHQ-2 Total Score 0  1 1  ?PHQ-9 Total Score -- -- 6  ? ?  ? ?Flowsheet Row Counselor from 07/09/2020 in [facility] Video Visit from 05/21/2020 in [facility]  ?C-SSRS RISK CATEGORY No Risk No Risk  ? ?  ? ? ?Assessment and Plan:  ?General anxiety disorder: ?-Continue Prozac 10 mg daily ?-Started hydroxyzine 10 mg TID PRN ? ?ADHD: ?Testing in high school and college ?-Continued Adderall 10 mg BID PRN, only during work days ? ?Insomnia: ?-Continue Ambien 10 mg at bedtime PRN ? ?Virtual Visit via Telephone Note ? ?I connected with Stephanie Patrick on [id number] at  9:00 AM EDT by telephone and verified that I am speaking with the correct person using two identifiers. ? ?Location: ?Patient: home  ?Provider: home office ?  ?I discussed the limitations, risks, security and privacy concerns of performing an evaluation and management service by telephone and the availability of in person appointments. I also discussed with the patient that there may be a patient responsible charge related to this  service. The patient expressed understanding and agreed to proceed. ? ?Follow Up Instructions: ?Follow up in 1 month ?  ?I discussed the assessment and treatment plan with the patient. The patient was provided an op

## 2021-06-18 ENCOUNTER — Telehealth (HOSPITAL_COMMUNITY): Payer: No Payment, Other | Admitting: Psychiatry

## 2021-06-18 ENCOUNTER — Encounter (HOSPITAL_COMMUNITY): Payer: Self-pay

## 2021-06-25 ENCOUNTER — Encounter (HOSPITAL_COMMUNITY): Payer: Self-pay | Admitting: Psychiatry

## 2021-06-25 ENCOUNTER — Telehealth (INDEPENDENT_AMBULATORY_CARE_PROVIDER_SITE_OTHER): Payer: No Payment, Other | Admitting: Psychiatry

## 2021-06-25 DIAGNOSIS — F411 Generalized anxiety disorder: Secondary | ICD-10-CM

## 2021-06-25 DIAGNOSIS — F900 Attention-deficit hyperactivity disorder, predominantly inattentive type: Secondary | ICD-10-CM | POA: Diagnosis not present

## 2021-06-25 DIAGNOSIS — F330 Major depressive disorder, recurrent, mild: Secondary | ICD-10-CM | POA: Diagnosis not present

## 2021-06-25 MED ORDER — GABAPENTIN 100 MG PO CAPS: 100.0000 mg | ORAL_CAPSULE | Freq: Three times a day (TID) | ORAL | 0 refills | Status: DC

## 2021-06-25 MED ORDER — HYDROXYZINE HCL 10 MG PO TABS: 10.0000 mg | ORAL_TABLET | Freq: Three times a day (TID) | ORAL | 0 refills | Status: DC | PRN

## 2021-06-25 MED ORDER — AMPHETAMINE-DEXTROAMPHETAMINE 10 MG PO TABS: 10.0000 mg | ORAL_TABLET | Freq: Two times a day (BID) | ORAL | 0 refills | Status: DC

## 2021-06-25 MED ORDER — FLUOXETINE HCL 10 MG PO CAPS: 10.0000 mg | ORAL_CAPSULE | Freq: Every day | ORAL | 0 refills | Status: DC

## 2021-06-25 MED ORDER — ZOLPIDEM TARTRATE 10 MG PO TABS: 10.0000 mg | ORAL_TABLET | Freq: Every evening | ORAL | 2 refills | Status: DC | PRN

## 2021-06-25 NOTE — Progress Notes (Signed)
[facility] NP OP Progress Notes ? ?Patient Identification: Stephanie Patrick ?MRN:  [id number] ?Date of Evaluation:  06/25/2021 ? ?Chief Complaint:  Anxiety ?Chief Complaint   ?Anxiety; Depression; Follow-up ?  ? ?Visit Diagnosis:  ?General anxiety disorder ? ?History of Present Illness:  36 year old female with a history of anxiety, ADHD, and depression returns for a follow-up visit to reassess anxiety and depression.  Her depression has improved, best it has been in six months, with no suicidal ideations.  Anxiety continues to be an issue with moderate levels related to issues sleeping.  The hydroxyzine is helping some with the anxiety.  Her Ambien was working quickly for sleep initiation and maintenance, less so now with initiation.  She has started working out 30 minutes a day to assist.  Her concentration has decreased because she stopped taking her Adderall because she was concerned about her sleep.  She feels she needs to return to using this as it helped.  Discussed medications and made the ones listed below to assist with her symptoms.  ? ?Past Psychiatric History: depression, anxiety ? ?Past Medical History:  ?Past Medical History:  ?Diagnosis Date  ? Acute pyelonephritis 03/2018  ? Acute respiratory failure ([place]) 03/25/2018  ? Ventilatory  ? ADHD 02/13/2017  ? Anxiety   ? Cardiomyopathy ([place]) 03/2018  ? 03/18/2018 EF 30-35% improved to 50-55% on 03/25/2018  ? Chicken pox   ? Complication of anesthesia   ? Woke up after surgery, felt paralyzed but unable to speak, made very scared  ? Depression   ? E. coli sepsis ([place]) 03/2018  ? Grade II diastolic dysfunction   ? noted on ECHO 03/19/2018 had improved on ECHO 03/25/2018  ? Gram negative septic shock ([place]) 03/2018  ? History of kidney stones 03/18/2018  ? 4 mm stone within the distal left ureter  ? Hydronephrosis 03/19/2018  ? Left hydronephrosis mild to moderate left hydroureteronephrosis  ? Migraines   ? Pneumonia   ? PONV (postoperative nausea and vomiting)   ?  Pulmonary edema 03/20/2018  ? Noted on CXR  ? Urinary tract infection   ?  ?Past Surgical History:  ?Procedure Laterality Date  ? CYSTOSCOPY W/ URETERAL STENT PLACEMENT Left 03/18/2018  ? Procedure: CYSTOSCOPY WITH RETROGRADE PYELOGRAM/URETERAL STENT PLACEMENT;  Surgeon: Festus Aloe, MD;  [facility]: [facility];  Service: Urology;  Laterality: Left;  ? CYSTOSCOPY/URETEROSCOPY/HOLMIUM LASER/STENT PLACEMENT Left 04/15/2018  ? Procedure: CYSTOSCOPY/URETEROSCOPY stent removal and stone extraction;  Surgeon: Festus Aloe, MD;  [facility]: [facility];  Service: Urology;  Laterality: Left;  ONLY NEEDS 60 MIN  ? WISDOM TOOTH EXTRACTION    ? ? ?Family Psychiatric History: mother with depression and anxiety along with all the women in my family--won't do anything about it.  Paternal side of addiction issues.  Father committed suicide when she was [age]. ? ?Family History:  ?Family History  ?Problem Relation Age of Onset  ? Healthy Mother   ? Diabetes Maternal Grandmother   ? Hypertension Maternal Grandmother   ? Breast cancer Maternal Grandmother   ? ? ?Social History:   ?Social History  ? ?Socioeconomic History  ? Marital status: Single  ?  Spouse name: Not on file  ? Number of children: Not on file  ? Years of education: Not on file  ? Highest education level: Not on file  ?Occupational History  ? Not on file  ?Tobacco Use  ? Smoking status: Never  ? Smokeless tobacco: Never  ?Vaping Use  ? Vaping Use: Never used  ?  Substance and Sexual Activity  ? Alcohol use: Yes  ? Drug use: No  ? Sexual activity: Yes  ?  Partners: Male  ?  Birth control/protection: None  ?Other Topics Concern  ? Not on file  ?Social History Narrative  ? Dad committed suicide  ? Mom in [place]  ? Enjoys reading.   ? Best friend lives here  ? ?Social Determinants of Health  ? ?Financial Resource Strain: Not on file  ?Food Insecurity: Not on file  ?Transportation Needs: Not on file  ?Physical Activity: Not on file  ?Stress: Not on file   ?Social Connections: Not on file  ? ? ?Additional Social History: Lives alone, moved here in January, back from [place] ? ?Allergies:   ?Allergies  ?Allergen Reactions  ? Vicodin [Hydrocodone-Acetaminophen] Nausea And Vomiting  ? ? ?Metabolic Disorder Labs: ?Lab Results  ?Component Value Date  ? HGBA1C 5.4 02/11/2018  ? ?No results found for: PROLACTIN ?Lab Results  ?Component Value Date  ? CHOL 272 (H) 04/01/2018  ? TRIG 193 (H) 04/01/2018  ? HDL 30 (L) 04/01/2018  ? CHOLHDL 9.1 (H) 04/01/2018  ? [place] 203 (H) 04/01/2018  ? LDLCALC 121 (H) 02/11/2018  ? ?Lab Results  ?Component Value Date  ? TSH 1.810 04/01/2018  ? ? ?Therapeutic Level Labs: ?No results found for: LITHIUM ?No results found for: CBMZ ?No results found for: VALPROATE ? ?Current Medications: ?Current Outpatient Medications  ?Medication Sig Dispense Refill  ? amphetamine-dextroamphetamine (ADDERALL) 10 MG tablet Take 1 tablet (10 mg total) by mouth 2 (two) times daily with a meal. 60 tablet 0  ? FLUoxetine (PROZAC) 10 MG capsule Take 1 capsule (10 mg total) by mouth daily. 30 capsule 2  ? zolpidem (AMBIEN) 10 MG tablet Take 1 tablet (10 mg total) by mouth at bedtime as needed for sleep. 30 tablet 2  ? ?No current facility-administered medications for this visit.  ? ? ?Musculoskeletal: ?Strength & Muscle Tone: WDL ?Gait & Station: WDL ?Patient leans: WDL ? ?Psychiatric Specialty Exam: ?Review of Systems  ?Psychiatric/Behavioral:  Positive for dysphoric mood and sleep disturbance. The patient is nervous/anxious.   ?All other systems reviewed and are negative.  ?There were no vitals taken for this visit.There is no height or weight on file to calculate BMI.  ?General Appearance: WDL  ?Eye Contact:  Good  ?Speech:  Normal Rate  ?Volume:  Normal  ?Mood:  anxiety, depression  ?Affect:  congruent  ?Thought Process:  Coherent and Descriptions of Associations: Intact  ?Orientation:  Full (Time, Place, and Person)  ?Thought Content:  WDL and Logical   ?Suicidal Thoughts:  No  ?Homicidal Thoughts:  No  ?Memory:  Immediate;   Good ?Recent;   Good ?Remote;   Good  ?Judgement:  Good  ?Insight:  Good  ?Psychomotor Activity:  Normal  ?Concentration:  Concentration: Good and Attention Span: Good  ?Recall:  Good  ?Fund of [place]  ?Language: Good  ?Akathisia:  No  ?Handed:  Right  ?AIMS (if indicated):  not done  ?Assets:  Housing ?Leisure Time ?Physical Health ?Resilience ?Social Support ?Vocational/Educational  ?ADL's:  Intact  ?Cognition: WNL  ?Sleep:  fair   ? ?Screenings: ?GAD-7   ? ?Flowsheet Row Counselor from 07/09/2020 in [facility]  ?Total GAD-7 Score 13  ? ?  ? ?PHQ2-9   ? ?Flowsheet Row Counselor from 07/09/2020 in [facility] Video Visit from 05/21/2020 in [facility] Office Visit from 02/13/2017 in [place]  [facility] [employer]  ?PHQ-2 Total Score 0 1 1  ?PHQ-9 Total Score -- -- 6  ? ?  ? ?Flowsheet Row Counselor from 07/09/2020 in [facility] Video Visit from 05/21/2020 in [facility]  ?C-SSRS RISK CATEGORY No Risk No Risk  ? ?  ? ? ?Assessment and Plan:  ?General anxiety disorder: ?-Continue Prozac 10 mg daily ?-Continue hydroxyzine 10 mg TID PRN ?-Continue gabapentin 100 mg TID PRN ? ?ADHD: ?Testing in high school and college ?-Continued Adderall 10 mg BID PRN, only during work days ? ?Insomnia: ?-Continue Ambien 10 mg at bedtime PRN ? ?Virtual Visit via Telephone Note ? ?I connected with [name] on [id number] at  3:00 PM EDT by telephone and verified that I am speaking with the correct person using two identifiers. ? ?Location: ?Patient: home  ?Provider: home office ?  ?I discussed the limitations, risks, security and privacy concerns of performing an evaluation and management service by telephone and the availability of in person appointments. I also discussed with the patient  that there may be a patient responsible charge related to this service. The patient expressed understanding and agreed to proceed. ? ?Follow Up Instructions: ?Follow up in 3 months ?  ?I discussed the assessment and

## 2021-06-28 ENCOUNTER — Ambulatory Visit (HOSPITAL_COMMUNITY): Payer: No Payment, Other | Admitting: Clinical

## 2021-07-12 ENCOUNTER — Ambulatory Visit (HOSPITAL_COMMUNITY): Payer: No Payment, Other | Admitting: Clinical

## 2021-08-13 ENCOUNTER — Encounter (HOSPITAL_COMMUNITY): Payer: Self-pay | Admitting: Psychiatry

## 2021-08-13 ENCOUNTER — Telehealth (INDEPENDENT_AMBULATORY_CARE_PROVIDER_SITE_OTHER): Payer: No Payment, Other | Admitting: Psychiatry

## 2021-08-13 DIAGNOSIS — F411 Generalized anxiety disorder: Secondary | ICD-10-CM | POA: Diagnosis not present

## 2021-08-13 DIAGNOSIS — F331 Major depressive disorder, recurrent, moderate: Secondary | ICD-10-CM | POA: Diagnosis not present

## 2021-08-13 MED ORDER — GABAPENTIN 100 MG PO CAPS: 100.0000 mg | ORAL_CAPSULE | Freq: Three times a day (TID) | ORAL | 0 refills | Status: AC

## 2021-08-13 MED ORDER — HYDROXYZINE HCL 10 MG PO TABS: 10.0000 mg | ORAL_TABLET | Freq: Three times a day (TID) | ORAL | 0 refills | Status: AC | PRN

## 2021-08-13 MED ORDER — FLUOXETINE HCL 10 MG PO CAPS: 20.0000 mg | ORAL_CAPSULE | Freq: Every day | ORAL | 0 refills | Status: DC

## 2021-08-13 MED ORDER — AMPHETAMINE-DEXTROAMPHETAMINE 10 MG PO TABS: 10.0000 mg | ORAL_TABLET | Freq: Two times a day (BID) | ORAL | 0 refills | Status: AC

## 2021-08-13 MED ORDER — ZOLPIDEM TARTRATE 10 MG PO TABS: 10.0000 mg | ORAL_TABLET | Freq: Every evening | ORAL | 2 refills | Status: DC | PRN

## 2021-08-13 NOTE — Progress Notes (Signed)
[facility] NP OP Progress Notes  Patient Identification: Stephanie Patrick MRN:  [id number] Date of Evaluation:  08/13/2021  Chief Complaint:  Anxiety Chief Complaint   Anxiety; Depression; Follow-up    Visit Diagnosis:  General anxiety disorder  History of Present Illness:  36 year old female with a history of anxiety, ADHD, and depression returns for a follow-up visit to reassess anxiety and depression.  Her depression has increased because of "a new toxic work environment."  She is not having suicidal ideations.  High anxiety with frequent panic attacks.  She had lost her job recently and took another job that is very stressful.  Her sleep is good with Ambien.  Her appetite is decreased with stress which she is happy about.  She is currently planning to end her lease and move back home to [place] for family and friend support while returning to school to complete her Master's Degree in cyber security.  No other concerns noted.  Past Psychiatric History: depression, anxiety  Past Medical History:  Past Medical History:  Diagnosis Date   Acute pyelonephritis 03/2018   Acute respiratory failure ([place]) 03/25/2018   Ventilatory   ADHD 02/13/2017   Anxiety    Cardiomyopathy ([place]) 03/2018   03/18/2018 EF 30-35% improved to 50-55% on 03/25/2018   Chicken pox    Complication of anesthesia    Woke up after surgery, felt paralyzed but unable to speak, made very scared   Depression    E. coli sepsis ([place]) 03/2018   Grade II diastolic dysfunction    noted on ECHO 03/19/2018 had improved on ECHO 03/25/2018   Gram negative septic shock ([place]) 03/2018   History of kidney stones 03/18/2018   4 mm stone within the distal left ureter   Hydronephrosis 03/19/2018   Left hydronephrosis mild to moderate left hydroureteronephrosis   Migraines    Pneumonia    PONV (postoperative nausea and vomiting)    Pulmonary edema 03/20/2018   Noted on CXR   Urinary tract infection     Past Surgical History:   Procedure Laterality Date   CYSTOSCOPY W/ URETERAL STENT PLACEMENT Left 03/18/2018   Procedure: CYSTOSCOPY WITH RETROGRADE PYELOGRAM/URETERAL STENT PLACEMENT;  Surgeon: Festus Aloe, MD;  [facility];  Service: Urology;  Laterality: Left;   CYSTOSCOPY/URETEROSCOPY/HOLMIUM LASER/STENT PLACEMENT Left 04/15/2018   Procedure: CYSTOSCOPY/URETEROSCOPY stent removal and stone extraction;  Surgeon: Festus Aloe, MD;  Location: [facility];  Service: Urology;  Laterality: Left;  ONLY NEEDS 60 MIN   WISDOM TOOTH EXTRACTION      Family Psychiatric History: mother with depression and anxiety along with all the women in my family--won't do anything about it.  Paternal side of addiction issues.  Father committed suicide when she was [age].  Family History:  Family History  Problem Relation Age of Onset   Healthy Mother    Diabetes Maternal Grandmother    Hypertension Maternal Grandmother    Breast cancer Maternal Grandmother     Social History:   Social History   Socioeconomic History   Marital status: Single    Spouse name: Not on file   Number of children: Not on file   Years of education: Not on file   Highest education level: Not on file  Occupational History   Not on file  Tobacco Use   Smoking status: Never   Smokeless tobacco: Never  Vaping Use   Vaping Use: Never used  Substance and Sexual Activity   Alcohol use: Yes   Drug use: No  Sexual activity: Yes    Partners: Male    Birth control/protection: None  Other Topics Concern   Not on file  Social History Narrative   Dad committed suicide   Mom in [place]   Enjoys reading.    Best friend lives here   Social Determinants of Radio broadcast assistant Strain: Not on file  Food Insecurity: Not on file  Transportation Needs: Not on file  Physical Activity: Not on file  Stress: Not on file  Social Connections: Not on file    Additional Social History: Lives alone, moved here in January,  back from [place]:   Allergies  Allergen Reactions   Vicodin [Hydrocodone-Acetaminophen] Nausea And Vomiting    Metabolic Disorder Labs: Lab Results  Component Value Date   HGBA1C 5.4 02/11/2018   No results found for: "PROLACTIN" Lab Results  Component Value Date   CHOL 272 (H) 04/01/2018   TRIG 193 (H) 04/01/2018   HDL 30 (L) 04/01/2018   CHOLHDL 9.1 (H) 04/01/2018   LDLCALC 203 (H) 04/01/2018   LDLCALC 121 (H) 02/11/2018   Lab Results  Component Value Date   TSH 1.810 04/01/2018    Therapeutic Level Labs: No results found for: "LITHIUM" No results found for: "CBMZ" No results found for: "VALPROATE"  Current Medications: Current Outpatient Medications  Medication Sig Dispense Refill   amphetamine-dextroamphetamine (ADDERALL) 10 MG tablet Take 1 tablet (10 mg total) by mouth 2 (two) times daily with a meal. 60 tablet 0   FLUoxetine (PROZAC) 10 MG capsule Take 1 capsule (10 mg total) by mouth daily. 90 capsule 0   gabapentin (NEURONTIN) 100 MG capsule Take 1 capsule (100 mg total) by mouth 3 (three) times daily. 270 capsule 0   hydrOXYzine (ATARAX) 10 MG tablet Take 1 tablet (10 mg total) by mouth 3 (three) times daily as needed. 90 tablet 0   zolpidem (AMBIEN) 10 MG tablet Take 1 tablet (10 mg total) by mouth at bedtime as needed for sleep. 30 tablet 2   No current facility-administered medications for this visit.    Musculoskeletal: Strength & Muscle Tone: WDL Gait & Station: WDL Patient leans: WDL  Psychiatric Specialty Exam: Review of Systems  Psychiatric/Behavioral:  Positive for dysphoric mood and sleep disturbance. The patient is nervous/anxious.   All other systems reviewed and are negative.   There were no vitals taken for this visit.There is no height or weight on file to calculate BMI.  General Appearance: WDL  Eye Contact:  Good  Speech:  Normal Rate  Volume:  Normal  Mood:  anxiety, depression  Affect:  congruent  Thought  Process:  Coherent and Descriptions of Associations: Intact  Orientation:  Full (Time, Place, and Person)  Thought Content:  WDL and Logical  Suicidal Thoughts:  No  Homicidal Thoughts:  No  Memory:  Immediate;   Good Recent;   Good Remote;   Good  Judgement:  Good  Insight:  Good  Psychomotor Activity:  Normal  Concentration:  Concentration: Good and Attention Span: Good  Recall:  Good  Fund of Knowledge:Good  Language: Good  Akathisia:  No  Handed:  Right  AIMS (if indicated):  not done  Assets:  Housing Leisure Time Physical Health Resilience Social Support Vocational/Educational  ADL's:  Intact  Cognition: WNL  Sleep:  fair    Screenings: GAD-7    Health and safety inspector from 07/09/2020 in [facility]  Total GAD-7 Score 13  [id number]    Flowsheet Row Counselor from 07/09/2020 in [facility] Video Visit from 05/21/2020 in [facility] Office Visit from 02/13/2017 in [place] at [facility]  PHQ-2 Total Score 0 1 1  PHQ-9 Total Score -- -- 6      [place] from 07/09/2020 in [facility] Video Visit from 05/21/2020 in [place] No Risk No Risk       Assessment and Plan:  General anxiety disorder: -Increased Prozac 10 mg daily to 20 mg daily -Continue hydroxyzine 10 mg TID PRN -Continue gabapentin 100 mg TID PRN  ADHD: Testing in high school and college -Continued Adderall 10 mg BID PRN, only during work days  Insomnia: -Continue Ambien 10 mg at bedtime PRN  Virtual Visit via Telephone Note  I connected with Stephanie Patrick on [id number] at  9:30 AM EDT by telephone and verified that I am speaking with the correct person using two identifiers.  Location: Patient: home  Provider: home office   I discussed the limitations, risks, security and  privacy concerns of performing an evaluation and management service by telephone and the availability of in person appointments. I also discussed with the patient that there may be a patient responsible charge related to this service. The patient expressed understanding and agreed to proceed.  Follow Up Instructions: Follow up in 2 months   I discussed the assessment and treatment plan with the patient. The patient was provided an opportunity to ask questions and all were answered. The patient agreed with the plan and demonstrated an understanding of the instructions.   The patient was advised to call back or seek an in-person evaluation if the symptoms worsen or if the condition fails to improve as anticipated.  I provided 20 minutes of non-face-to-face time during this encounter.   Waylan Boga, NP   Waylan Boga, NP 6/13/20239:36 AM

## 2021-09-17 ENCOUNTER — Telehealth (HOSPITAL_COMMUNITY): Payer: No Payment, Other | Admitting: Student in an Organized Health Care Education/Training Program

## 2021-09-23 ENCOUNTER — Telehealth (HOSPITAL_COMMUNITY): Payer: No Payment, Other | Admitting: Student in an Organized Health Care Education/Training Program

## 2021-10-22 ENCOUNTER — Telehealth (HOSPITAL_COMMUNITY): Payer: Self-pay | Admitting: Student in an Organized Health Care Education/Training Program

## 2021-11-21 ENCOUNTER — Other Ambulatory Visit (HOSPITAL_COMMUNITY): Payer: Self-pay | Admitting: Psychiatry

## 2021-11-25 ENCOUNTER — Encounter (HOSPITAL_COMMUNITY): Payer: Self-pay | Admitting: Student in an Organized Health Care Education/Training Program

## 2021-11-25 ENCOUNTER — Telehealth (INDEPENDENT_AMBULATORY_CARE_PROVIDER_SITE_OTHER): Payer: No Payment, Other | Admitting: Student in an Organized Health Care Education/Training Program

## 2021-11-25 DIAGNOSIS — F411 Generalized anxiety disorder: Secondary | ICD-10-CM

## 2021-11-25 DIAGNOSIS — G4700 Insomnia, unspecified: Secondary | ICD-10-CM | POA: Diagnosis not present

## 2021-11-25 DIAGNOSIS — F331 Major depressive disorder, recurrent, moderate: Secondary | ICD-10-CM

## 2021-11-25 MED ORDER — HYDROXYZINE HCL 10 MG PO TABS: 10.0000 mg | ORAL_TABLET | Freq: Three times a day (TID) | ORAL | 1 refills | Status: AC | PRN

## 2021-11-25 MED ORDER — ZOLPIDEM TARTRATE 10 MG PO TABS: 10.0000 mg | ORAL_TABLET | Freq: Every evening | ORAL | 0 refills | Status: AC | PRN

## 2021-11-25 MED ORDER — CITALOPRAM HYDROBROMIDE 10 MG PO TABS: 10.0000 mg | ORAL_TABLET | Freq: Every day | ORAL | 2 refills | Status: AC

## 2021-11-25 NOTE — Progress Notes (Signed)
[facility] MD/PA/NP OP Progress Note  [id number] PM Stephanie Patrick  MRN:  [id number]  Chief Complaint:  Chief Complaint  Patient presents with   Follow-up   HPI: Stephanie Patrick is a 36 yo female with a history of anxiety, ADHD, and depression returns for a follow-up visit to reassess anxiety and depression. Patient was previously seen by Stephanie Boga, DNP.  Virtual Visit via Video Note  I connected with Stephanie Patrick on [id number] at  1:00 PM EDT by a video enabled telemedicine application and verified that I am speaking with the correct person using two identifiers.  Location: Patient: Home, Alone Provider: Office   I discussed the limitations of evaluation and management by telemedicine and the availability of in person appointments. The patient expressed understanding and agreed to proceed.  History of Present Illness:  On assessment this PM patient reports that she is compliant with the follow medication regimen:  Adderall IR 10mg  BID PRN (work days) Hydroxyzine 10mg  TID PRN Ativan 1mg  PRN Ambien CR 10mg  QHS  Patient reports that she has discontinued her Prozac 20mg , on her own due to feeling apathetic on the medication and having low to no sexual drive. Patient reports that she does not currently have a partner, but she did not like the feeling. Patient reports that she is also taking Ativan 1mg  PRN apprx 1 time per week, for significant anxiety. Patient reports she is trying to stay away from using the controlled substance and is relying more on Hydroxyzine. Patient reports she does get some benefit from the hydroxyzine as it does help calm her. 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 Patient reports that she is still in school and that she is trying to do her work in 45 min increments to decrease need for Adderall.   Patient reports she was NOT fired from her job  due to poor  performance and that the she knew that it was temporary. Despite this, she says that she does still feel like her work suffered due to hypervigilance 2/2 her prior stressful work environment. Patient reports that she felt that her most recent job was a better environment.   Patient also endorsed that she does not take gabapentin as she believes it was sedating, but that she does take an occasional 15mg  buspar and that this really eases her anxiety, so she does not take many, because she feel too at ease.   Patient reports that she is having hypersomnia, but describes not feeling well rested, anhedonia, feelings of hopelessness/worthlessness/ and guilt, decrease energy, and decrease concentration. Patient denies SI, HI, and AVH. Patient endorses that she has been more withdrawn.  Patient reports that she believes that her dysphoric symptoms are 2/2 to her anxiety. Patient reports that she is constantly worried, hypervigilant, has somatization of symptoms including myalgia in her BLE.  Patient reports that her panic attacks are not less frequent.   On brief screen patient does not endorse any hx of manic or hypomanic episodes.   I discussed the assessment and treatment plan with the patient. The patient was provided an opportunity to ask questions and all were answered. The patient agreed with the plan and demonstrated an understanding of the instructions.   The patient was advised to call back or seek an in-person evaluation if the symptoms worsen or if the condition fails to improve as anticipated.  I provided 40 minutes  of non-face-to-face time during this encounter.  PGY-3 Stephanie Busman, MD   Visit Diagnosis:    ICD-10-CM   1. Major depressive disorder, recurrent episode, moderate (HCC)  F33.1 citalopram (CELEXA) 10 MG tablet    2. Generalized anxiety disorder  F41.1 citalopram (CELEXA) 10 MG tablet    hydrOXYzine (ATARAX) 10 MG tablet    3. Insomnia, unspecified type  G47.00 zolpidem  (AMBIEN) 10 MG tablet      Past Psychiatric History:  ADHD: Endorses that she may have been dx as a child, but only officlaly recalls dx in college. Patient reports that she does believe she had issues in school that her school attemped to addres in K-12. Patient also had significant anxiety and depression in HS MDD, GAD  INPT: At [age] yo, for depression  OUTPT: Yes SA: +, via OD on pills  Previous medications: Zoloft, Celexa 20mg  (helped, but did have some wgt gain), Paxil, Effexor, Prozac( apathy)  Past Medical History:  Past Medical History:  Diagnosis Date   Acute pyelonephritis 03/2018   Acute respiratory failure ([place]) 03/25/2018   Ventilatory   ADHD 02/13/2017   Anxiety    Cardiomyopathy ([place]) 03/2018   03/18/2018 EF 30-35% improved to 50-55% on 03/25/2018   Chicken pox    Complication of anesthesia    Woke up after surgery, felt paralyzed but unable to speak, made very scared   Depression    E. coli sepsis ([place]) 03/2018   Grade II diastolic dysfunction    noted on ECHO 03/19/2018 had improved on ECHO 03/25/2018   Gram negative septic shock ([place]) 03/2018   History of kidney stones 03/18/2018   4 mm stone within the distal left ureter   Hydronephrosis 03/19/2018   Left hydronephrosis mild to moderate left hydroureteronephrosis   Migraines    Pneumonia    PONV (postoperative nausea and vomiting)    Pulmonary edema 03/20/2018   Noted on CXR   Urinary tract infection     Past Surgical History:  Procedure Laterality Date   CYSTOSCOPY W/ URETERAL STENT PLACEMENT Left 03/18/2018   Procedure: CYSTOSCOPY WITH RETROGRADE PYELOGRAM/URETERAL STENT PLACEMENT;  Surgeon: Stephanie Aloe, MD;  [facility];  Service: Urology;  Laterality: Left;   CYSTOSCOPY/URETEROSCOPY/HOLMIUM LASER/STENT PLACEMENT Left 04/15/2018   Procedure: CYSTOSCOPY/URETEROSCOPY stent removal and stone extraction;  Surgeon: Stephanie Aloe, MD;  Location: [facility];  Service: Urology;   Laterality: Left;  ONLY NEEDS 60 MIN   WISDOM TOOTH EXTRACTION      Family Psychiatric History:  Brother: ADHD  Family History:  Family History  Problem Relation Age of Onset   Healthy Mother    Diabetes Maternal Grandmother    Hypertension Maternal Grandmother    Breast cancer Maternal Grandmother     Social History:  Social History   Socioeconomic History   Marital status: Single    Spouse name: Not on file   Number of children: Not on file   Years of education: Not on file   Highest education level: Not on file  Occupational History   Not on file  Tobacco Use   Smoking status: Never   Smokeless tobacco: Never  Vaping Use   Vaping Use: Never used  Substance and Sexual Activity   Alcohol use: Yes   Drug use: No   Sexual activity: Yes    Partners: Male    Birth control/protection: None  Other Topics Concern   Not on file  Social History Narrative   Dad committed suicide  Mom in [place]   Enjoys reading.    Best friend lives here   Social Determinants of Radio broadcast assistant Strain: Not on file  Food Insecurity: Not on file  Transportation Needs: Not on file  Physical Activity: Not on file  Stress: Not on file  Social Connections: Not on file    Allergies:  Allergies  Allergen Reactions   Vicodin [Hydrocodone-Acetaminophen] Nausea And Vomiting    Metabolic Disorder Labs: Lab Results  Component Value Date   HGBA1C 5.4 02/11/2018   No results found for: "PROLACTIN" Lab Results  Component Value Date   CHOL 272 (H) 04/01/2018   TRIG 193 (H) 04/01/2018   HDL 30 (L) 04/01/2018   CHOLHDL 9.1 (H) 04/01/2018   LDLCALC 203 (H) 04/01/2018   LDLCALC 121 (H) 02/11/2018   Lab Results  Component Value Date   TSH 1.810 04/01/2018   TSH 2.350 02/11/2018    Therapeutic Level Labs: No results found for: "LITHIUM" No results found for: "VALPROATE" No results found for: "CBMZ"  Current Medications: Current Outpatient Medications   Medication Sig Dispense Refill   citalopram (CELEXA) 10 MG tablet Take 1 tablet (10 mg total) by mouth daily. TAKE 5 MG FOR X 7 DAYS 30 tablet 2   hydrOXYzine (ATARAX) 10 MG tablet Take 1 tablet (10 mg total) by mouth 3 (three) times daily as needed. 30 tablet 1   amphetamine-dextroamphetamine (ADDERALL) 10 MG tablet Take 1 tablet (10 mg total) by mouth 2 (two) times daily with a meal. 60 tablet 0   gabapentin (NEURONTIN) 100 MG capsule Take 1 capsule (100 mg total) by mouth 3 (three) times daily. 270 capsule 0   zolpidem (AMBIEN) 10 MG tablet Take 1 tablet (10 mg total) by mouth at bedtime as needed. for sleep 30 tablet 0   No current facility-administered medications for this visit.     Musculoskeletal: Defer Psychiatric Specialty Exam: Review of Systems  Musculoskeletal:  Positive for myalgias.  Psychiatric/Behavioral:  Positive for decreased concentration, dysphoric mood and sleep disturbance. Negative for hallucinations and suicidal ideas.     There were no vitals taken for this visit.There is no height or weight on file to calculate BMI.  General Appearance: Casual  Eye Contact:  Good  Speech:  Clear and Coherent  Volume:  Normal  Mood:  Dysphoric  Affect:  Tearful really tries to hold tears in, but eyes water on occasion during assessment  Thought Process:  Coherent  Orientation:  Full (Time, Place, and Person)  Thought Content: Logical   Suicidal Thoughts:  No  Homicidal Thoughts:  No  Memory:  Immediate;   Good Recent;   Fair Remote;   Fair  Judgement:  Fair  Insight:  Fair  Psychomotor Activity:  Normal  Concentration:  Concentration: Fair  Recall:  NA  Fund of Knowledge: Good  Language: Good  Akathisia:  NA    AIMS (if indicated): not done  Assets:  Communication Skills Desire for Improvement Housing Resilience  ADL's:  Intact  Cognition: WNL  Sleep:  Fair   Screenings: GAD-7    Health and safety inspector from 07/09/2020 in [facility]  Total GAD-7 Score 13      PHQ2-9    [place] Counselor from 07/09/2020 in [facility] Video Visit from 05/21/2020 in [facility] Office Visit from 02/13/2017 in [place] at [facility]  PHQ-2 Total Score 0 1 1  PHQ-9  Total Score -- -- 6      Flowsheet Row Counselor from 07/09/2020 in [facility] Video Visit from 05/21/2020 in [place] No Risk No Risk        Assessment and Plan: Lowella Kindley is a 36 yo female with a history of anxiety, ADHD, and depression returns for a follow-up visit to reassess anxiety and depression. Patient endorses symptoms of both GAD and MDD, subjectively patient feels that her depression is a response to her anxiety and that her anxiety is her main concern. Patient did just go through another major stressors/ change by losing her job in the last week. Patient appears to have insight and fair judgement (practicing good coping skills) ; however her medication regimen will require more structure as she has numerous PRNs and this has led to an arsenal of medication that patient is taking PRN. At this time will discontinue Prozac due to apathy and sexual side effects and restart Celexa with intention to titrate up as tolerated. Patient would benefit from having her buspar decreased and scheduled however patient endorsed a wish to only change one thing today, which is fine. Will consider decreasing and eventually stopping patient Ambien at next appt and may replace with gabapentin if necessary, to minimize the controlled substances PRNs. Patient may also not need Adderall and could possibly benefit from Strattera more, if she does need something for ADHD symptoms as this may help her mood. Wellbutrin monotherapy or adjunct was not highly preferred due to patient's anxiety level.     GAD MDD - Discontinue Prozac 20mg  - Start Celexa 5mg  x7 days then increase to 10mg , intend to titrate up as tolerated - Discontinue Gabapentin 100mg  TID PRN - Discontinue Ativan 1mg  PRN - Continue Hydroxyzine 10mg  TID PRN - Discontinue Buspar 15mg  PRN  ADHD - Holding, Adderall IR 10mg  BID PRN, work days-- not refilled at this time  Insomnia - Continue Ambien 10mg  QHS  Follow-up in approx 1 mon.  Collaboration of Care: Collaboration of Care:   Patient/Guardian was advised Release of Information must be obtained prior to any record release in order to collaborate their care with an outside provider. Patient/Guardian was advised if they have not already done so to contact the registration department to sign all necessary forms in order for [place] to release information regarding their care.   Consent: Patient/Guardian gives verbal consent for treatment and assignment of benefits for services provided during this visit. Patient/Guardian expressed understanding and agreed to proceed.    PGY-3 Stephanie Busman, MD 11/25/2021, 2:06 PM

## 2021-12-09 ENCOUNTER — Ambulatory Visit: Payer: Self-pay | Admitting: Family
